# Patient Record
Sex: Female | Born: 1935 | Race: White | Hispanic: No | Smoking: Former smoker
Health system: Southern US, Community
[De-identification: ages and names within clinical notes are randomized; demographics above are authoritative.]

## PROBLEM LIST (undated history)

## (undated) DIAGNOSIS — E785 Hyperlipidemia, unspecified: Secondary | ICD-10-CM

## (undated) DIAGNOSIS — Z87440 Personal history of urinary (tract) infections: Secondary | ICD-10-CM

## (undated) DIAGNOSIS — F329 Major depressive disorder, single episode, unspecified: Secondary | ICD-10-CM

## (undated) DIAGNOSIS — R51 Headache: Secondary | ICD-10-CM

## (undated) DIAGNOSIS — N393 Stress incontinence (female) (male): Secondary | ICD-10-CM

## (undated) DIAGNOSIS — E2749 Other adrenocortical insufficiency: Secondary | ICD-10-CM

## (undated) DIAGNOSIS — K5904 Chronic idiopathic constipation: Secondary | ICD-10-CM

## (undated) DIAGNOSIS — F0390 Unspecified dementia without behavioral disturbance: Secondary | ICD-10-CM

## (undated) DIAGNOSIS — Z923 Personal history of irradiation: Secondary | ICD-10-CM

## (undated) DIAGNOSIS — C50919 Malignant neoplasm of unspecified site of unspecified female breast: Secondary | ICD-10-CM

## (undated) DIAGNOSIS — K219 Gastro-esophageal reflux disease without esophagitis: Secondary | ICD-10-CM

## (undated) DIAGNOSIS — R5383 Other fatigue: Secondary | ICD-10-CM

## (undated) DIAGNOSIS — G250 Essential tremor: Secondary | ICD-10-CM

## (undated) DIAGNOSIS — H9190 Unspecified hearing loss, unspecified ear: Secondary | ICD-10-CM

## (undated) DIAGNOSIS — F32A Depression, unspecified: Secondary | ICD-10-CM

## (undated) DIAGNOSIS — M1990 Unspecified osteoarthritis, unspecified site: Secondary | ICD-10-CM

## (undated) DIAGNOSIS — F419 Anxiety disorder, unspecified: Secondary | ICD-10-CM

## (undated) DIAGNOSIS — K5909 Other constipation: Secondary | ICD-10-CM

## (undated) DIAGNOSIS — S42009A Fracture of unspecified part of unspecified clavicle, initial encounter for closed fracture: Secondary | ICD-10-CM

## (undated) DIAGNOSIS — I10 Essential (primary) hypertension: Secondary | ICD-10-CM

## (undated) DIAGNOSIS — K559 Vascular disorder of intestine, unspecified: Secondary | ICD-10-CM

## (undated) HISTORY — PX: TONSILLECTOMY: SHX5217

## (undated) HISTORY — DX: Unspecified osteoarthritis, unspecified site: M19.90

## (undated) HISTORY — PX: ABDOMINAL HYSTERECTOMY: SHX81

## (undated) HISTORY — PX: BUNIONECTOMY: SHX129

## (undated) HISTORY — PX: TONSILLECTOMY: SUR1361

## (undated) HISTORY — DX: Malignant neoplasm of unspecified site of unspecified female breast: C50.919

## (undated) HISTORY — PX: UPPER GASTROINTESTINAL ENDOSCOPY: SHX188

## (undated) HISTORY — PX: LUMBAR LAMINECTOMY: SHX95

## (undated) HISTORY — PX: FOOT SURGERY: SHX648

## (undated) HISTORY — DX: Vascular disorder of intestine, unspecified: K55.9

## (undated) HISTORY — DX: Gastro-esophageal reflux disease without esophagitis: K21.9

## (undated) HISTORY — PX: BACK SURGERY: SHX140

## (undated) HISTORY — PX: OTHER SURGICAL HISTORY: SHX169

---

## 1985-12-01 HISTORY — PX: KNEE SURGERY: SHX244

## 2001-11-03 ENCOUNTER — Ambulatory Visit (HOSPITAL_COMMUNITY): Admission: RE | Admit: 2001-11-03 | Discharge: 2001-11-03 | Payer: Self-pay | Admitting: Internal Medicine

## 2001-11-03 ENCOUNTER — Encounter: Payer: Self-pay | Admitting: Internal Medicine

## 2001-12-01 HISTORY — PX: BREAST SURGERY: SHX581

## 2001-12-01 HISTORY — PX: OTHER SURGICAL HISTORY: SHX169

## 2001-12-16 ENCOUNTER — Ambulatory Visit (HOSPITAL_COMMUNITY): Admission: RE | Admit: 2001-12-16 | Discharge: 2001-12-16 | Payer: Self-pay | Admitting: Internal Medicine

## 2001-12-16 ENCOUNTER — Encounter: Payer: Self-pay | Admitting: Internal Medicine

## 2001-12-24 ENCOUNTER — Encounter: Admission: RE | Admit: 2001-12-24 | Discharge: 2001-12-24 | Payer: Self-pay | Admitting: Internal Medicine

## 2001-12-24 ENCOUNTER — Encounter: Payer: Self-pay | Admitting: Internal Medicine

## 2001-12-24 ENCOUNTER — Encounter (INDEPENDENT_AMBULATORY_CARE_PROVIDER_SITE_OTHER): Payer: Self-pay | Admitting: *Deleted

## 2002-01-04 ENCOUNTER — Ambulatory Visit (HOSPITAL_BASED_OUTPATIENT_CLINIC_OR_DEPARTMENT_OTHER): Admission: RE | Admit: 2002-01-04 | Discharge: 2002-01-04 | Payer: Self-pay | Admitting: Family

## 2002-01-04 ENCOUNTER — Encounter: Admission: RE | Admit: 2002-01-04 | Discharge: 2002-01-04 | Payer: Self-pay | Admitting: General Surgery

## 2002-01-04 ENCOUNTER — Encounter (INDEPENDENT_AMBULATORY_CARE_PROVIDER_SITE_OTHER): Payer: Self-pay | Admitting: *Deleted

## 2002-01-04 ENCOUNTER — Encounter: Payer: Self-pay | Admitting: General Surgery

## 2002-01-18 ENCOUNTER — Ambulatory Visit (HOSPITAL_BASED_OUTPATIENT_CLINIC_OR_DEPARTMENT_OTHER): Admission: RE | Admit: 2002-01-18 | Discharge: 2002-01-18 | Payer: Self-pay | Admitting: General Surgery

## 2002-01-18 ENCOUNTER — Encounter (INDEPENDENT_AMBULATORY_CARE_PROVIDER_SITE_OTHER): Payer: Self-pay | Admitting: *Deleted

## 2002-02-02 ENCOUNTER — Ambulatory Visit: Admission: RE | Admit: 2002-02-02 | Discharge: 2002-05-03 | Payer: Self-pay | Admitting: Radiation Oncology

## 2002-03-28 ENCOUNTER — Ambulatory Visit (HOSPITAL_COMMUNITY): Admission: RE | Admit: 2002-03-28 | Discharge: 2002-03-28 | Payer: Self-pay | Admitting: Ophthalmology

## 2002-03-29 ENCOUNTER — Ambulatory Visit (HOSPITAL_COMMUNITY): Admission: RE | Admit: 2002-03-29 | Discharge: 2002-03-29 | Payer: Self-pay | Admitting: Ophthalmology

## 2002-06-21 ENCOUNTER — Ambulatory Visit (HOSPITAL_COMMUNITY): Admission: RE | Admit: 2002-06-21 | Discharge: 2002-06-21 | Payer: Self-pay | Admitting: Ophthalmology

## 2003-01-04 ENCOUNTER — Encounter: Payer: Self-pay | Admitting: General Surgery

## 2003-01-04 ENCOUNTER — Encounter: Admission: RE | Admit: 2003-01-04 | Discharge: 2003-01-04 | Payer: Self-pay | Admitting: General Surgery

## 2003-06-02 ENCOUNTER — Ambulatory Visit (HOSPITAL_COMMUNITY): Admission: RE | Admit: 2003-06-02 | Discharge: 2003-06-02 | Payer: Self-pay | Admitting: Internal Medicine

## 2003-06-02 ENCOUNTER — Encounter: Payer: Self-pay | Admitting: Internal Medicine

## 2003-08-01 ENCOUNTER — Encounter: Payer: Self-pay | Admitting: Internal Medicine

## 2003-08-01 ENCOUNTER — Ambulatory Visit (HOSPITAL_COMMUNITY): Admission: RE | Admit: 2003-08-01 | Discharge: 2003-08-01 | Payer: Self-pay | Admitting: Internal Medicine

## 2003-12-02 HISTORY — PX: EYE SURGERY: SHX253

## 2004-01-09 ENCOUNTER — Encounter: Admission: RE | Admit: 2004-01-09 | Discharge: 2004-01-09 | Payer: Self-pay | Admitting: General Surgery

## 2004-03-22 ENCOUNTER — Ambulatory Visit (HOSPITAL_COMMUNITY): Admission: RE | Admit: 2004-03-22 | Discharge: 2004-03-22 | Payer: Self-pay | Admitting: Internal Medicine

## 2004-04-16 ENCOUNTER — Ambulatory Visit (HOSPITAL_COMMUNITY): Admission: RE | Admit: 2004-04-16 | Discharge: 2004-04-16 | Payer: Self-pay | Admitting: Internal Medicine

## 2004-09-27 ENCOUNTER — Ambulatory Visit (HOSPITAL_COMMUNITY): Admission: RE | Admit: 2004-09-27 | Discharge: 2004-09-27 | Payer: Self-pay | Admitting: Internal Medicine

## 2004-11-30 ENCOUNTER — Emergency Department (HOSPITAL_COMMUNITY): Admission: EM | Admit: 2004-11-30 | Discharge: 2004-11-30 | Payer: Self-pay | Admitting: Emergency Medicine

## 2005-01-09 ENCOUNTER — Encounter: Admission: RE | Admit: 2005-01-09 | Discharge: 2005-01-09 | Payer: Self-pay | Admitting: General Surgery

## 2005-02-07 ENCOUNTER — Ambulatory Visit (HOSPITAL_COMMUNITY): Admission: RE | Admit: 2005-02-07 | Discharge: 2005-02-07 | Payer: Self-pay | Admitting: Internal Medicine

## 2005-03-31 ENCOUNTER — Ambulatory Visit: Payer: Self-pay | Admitting: Internal Medicine

## 2005-04-22 ENCOUNTER — Ambulatory Visit (HOSPITAL_COMMUNITY): Admission: RE | Admit: 2005-04-22 | Discharge: 2005-04-22 | Payer: Self-pay | Admitting: Internal Medicine

## 2005-04-22 ENCOUNTER — Ambulatory Visit: Payer: Self-pay | Admitting: Internal Medicine

## 2005-05-21 ENCOUNTER — Ambulatory Visit: Payer: Self-pay | Admitting: Internal Medicine

## 2005-12-08 ENCOUNTER — Ambulatory Visit: Payer: Self-pay | Admitting: Internal Medicine

## 2006-01-30 ENCOUNTER — Encounter: Admission: RE | Admit: 2006-01-30 | Discharge: 2006-01-30 | Payer: Self-pay | Admitting: General Surgery

## 2006-03-24 ENCOUNTER — Encounter (INDEPENDENT_AMBULATORY_CARE_PROVIDER_SITE_OTHER): Payer: Self-pay | Admitting: *Deleted

## 2006-03-24 ENCOUNTER — Ambulatory Visit (HOSPITAL_COMMUNITY): Admission: RE | Admit: 2006-03-24 | Discharge: 2006-03-24 | Payer: Self-pay | Admitting: Internal Medicine

## 2006-03-24 ENCOUNTER — Ambulatory Visit: Payer: Self-pay | Admitting: Internal Medicine

## 2006-04-23 ENCOUNTER — Ambulatory Visit (HOSPITAL_COMMUNITY): Admission: RE | Admit: 2006-04-23 | Discharge: 2006-04-23 | Payer: Self-pay | Admitting: Family Medicine

## 2006-04-28 ENCOUNTER — Encounter (HOSPITAL_COMMUNITY): Admission: RE | Admit: 2006-04-28 | Discharge: 2006-05-28 | Payer: Self-pay | Admitting: Family Medicine

## 2006-05-06 ENCOUNTER — Ambulatory Visit: Payer: Self-pay | Admitting: Internal Medicine

## 2006-12-16 ENCOUNTER — Ambulatory Visit: Payer: Self-pay | Admitting: Internal Medicine

## 2007-02-02 ENCOUNTER — Encounter: Admission: RE | Admit: 2007-02-02 | Discharge: 2007-02-02 | Payer: Self-pay | Admitting: General Surgery

## 2008-03-03 ENCOUNTER — Encounter: Admission: RE | Admit: 2008-03-03 | Discharge: 2008-03-03 | Payer: Self-pay | Admitting: Internal Medicine

## 2008-12-15 ENCOUNTER — Ambulatory Visit (HOSPITAL_COMMUNITY): Admission: RE | Admit: 2008-12-15 | Discharge: 2008-12-15 | Payer: Self-pay | Admitting: Internal Medicine

## 2009-02-21 ENCOUNTER — Ambulatory Visit (HOSPITAL_COMMUNITY): Admission: RE | Admit: 2009-02-21 | Discharge: 2009-02-21 | Payer: Self-pay | Admitting: Internal Medicine

## 2009-05-24 ENCOUNTER — Ambulatory Visit (HOSPITAL_COMMUNITY): Admission: RE | Admit: 2009-05-24 | Discharge: 2009-05-24 | Payer: Self-pay | Admitting: Internal Medicine

## 2010-07-05 ENCOUNTER — Ambulatory Visit (HOSPITAL_COMMUNITY)
Admission: RE | Admit: 2010-07-05 | Discharge: 2010-07-05 | Payer: Self-pay | Source: Home / Self Care | Admitting: Internal Medicine

## 2010-12-06 ENCOUNTER — Ambulatory Visit (HOSPITAL_COMMUNITY)
Admission: RE | Admit: 2010-12-06 | Discharge: 2010-12-06 | Payer: Self-pay | Source: Home / Self Care | Attending: Internal Medicine | Admitting: Internal Medicine

## 2010-12-11 ENCOUNTER — Ambulatory Visit
Admission: RE | Admit: 2010-12-11 | Discharge: 2010-12-11 | Payer: Self-pay | Source: Home / Self Care | Attending: Internal Medicine | Admitting: Internal Medicine

## 2011-01-06 ENCOUNTER — Ambulatory Visit (INDEPENDENT_AMBULATORY_CARE_PROVIDER_SITE_OTHER): Payer: Medicare Other | Admitting: Internal Medicine

## 2011-01-06 DIAGNOSIS — K5900 Constipation, unspecified: Secondary | ICD-10-CM

## 2011-01-06 DIAGNOSIS — K210 Gastro-esophageal reflux disease with esophagitis: Secondary | ICD-10-CM

## 2011-01-10 NOTE — Consult Note (Signed)
  Bonnie [name] NO.:  [id number]  MEDICAL RECORD NO.:  [id number]          PATIENT TYPE:  OUT  LOCATION:  [place]                           FACILITY:  [facility]  PHYSICIAN:  Bonnie [name], M.D.    DATE OF BIRTH:  [birth year]  DATE OF VISIT:  01/06/2011                                 OFFICE NOTE   PRESENTING COMPLAINT:  Followup for nausea and GERD.  SUBJECTIVE:  Bonnie [name] is an 77 year old Caucasian female patient of Bonnie [name] who is here for scheduled visit.  She was last seen by Ms. Sasser NP on December 11, 2010.  She was switched to Dexilant.  She states she is doing much better.  She is still wondering what happened to her back in [name] when she was at her cabin and at 3 days that she has no recollection, but she states she was thought to have a CVA, however, the studies were negative.  She states she has been under lot of stress pertaining to selling of home and moving to condo, but she says since his son has been dealing with real estate agents.  When she experienced a bout of sickness in [name], she does not remember of whether or not she took her hydrocortisone.  She states she has heartburn only with certain foods that she should be eating in the first place.  She is still having some nocturnal regurgitation.  She denies chronic cough, hoarseness or sore throat.  She is having problems with constipation.  CURRENT MEDICATIONS: 1. Hair, skin, nail vitamin daily. 2. Ginkgo one daily. 3. MVI daily. 4. Hydrocortisone 10 mg q.a.m. 5. Dyazide one daily. 6. Garlic one daily. 7. Mirtazapine 15 mg p.o. nightly. 8. Simvastatin 40 mg daily. 9. Citalopram question 20 mg nightly. 10.Dexilant 60 mg p.o. q.a.m.  OBJECTIVE:  VITAL SIGNS:  Weight 133.5 pounds, she is 62 inches tall, pulse 72 per minute, blood pressure 122/74, temp is 97.4. EYES:  Conjunctivae are pink.  Sclerae are nonicteric. MOUTH:  Oropharyngeal mucosa is normal. NECK:  No neck  masses are noted. ABDOMEN:  Symmetrical, soft and nontender without organomegaly or masses. EXTREMITIES:  No peripheral edema or clubbing noted.  ASSESSMENT: 1. Chronic gastroesophageal reflux disease.  She is doing better with     Dexilant.  Since she is having nocturnal symptoms, she will be     better off trying Dexilant prior to evening meal.  She can use OTC     antacids for breakthrough symptoms. 2. The patient has history of hypoadrenalism.  Should she experience     another spell of weakness, confusion or nausea, I would ask Dr.     Sherwood [name] to assess adrenal function and determine whether dose of     hydrocortisone should be increased. 3. She will return for OV in 6 months.     Bonnie [name], M.D.     NR/MEDQ  D:  01/07/2011  T:  01/07/2011  Job:  [id number]  cc:   Bonnie Rear. Sherwood Gambler, MD Fax: [phone]  Electronically Signed by Bonnie [name] M.D. on 01/10/2011 12:12:14 PM

## 2011-04-18 NOTE — Op Note (Signed)
[place]  Patient:    Bonnie [name], Bonnie [name] Visit Number: [id number] MRN: [id number]          Service Type: DSU Location: [facility] Attending Physician:  Janalyn Rouse Dictated by:   Rose Phi. Maple [place], M.D. Proc. 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  OPERATION:  Re-excision of previous lumpectomy site.  SURGEON:  Rose Phi. Maple [place], M.D.  ANESTHESIA:  General.  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  After suitable general anesthesia was induced, the patient was placed in the supine position.  The left arm was extended on the arm board.  The breast was prepped and draped in the usual fashion.  The old radial incision was then excised outlined originally with the marking pencil and then a wide excision was carried out of the previous lumpectomy site.  We did not enter the site while we were doing this.  Hemostasis was obtained with electrocautery.  Subcuticular closure with 4-0 Monocryl and Steri-Strips was carried out.  A dressing was applied.  The patient was transferred to the recovery room in satisfactory condition having tolerated the procedure well. Dictated by:   Rose Phi. Maple [place], M.D. Attending Physician:  Janalyn Rouse DD:  01/18/02 TD:  01/18/02 Job: [id number]

## 2011-04-18 NOTE — H&P (Signed)
Bonnie [name], [name]               ACCOUNT NO.:  [id number]   MEDICAL RECORD NO.:  [id number]            PATIENT TYPE:  AMB   LOCATION:                                 FACILITY:   PHYSICIAN:  Lionel December, M.D.    DATE OF BIRTH:  05-08-1927   DATE OF ADMISSION:  DATE OF DISCHARGE:  LH                                HISTORY & PHYSICAL   PRESENTING COMPLAINT:  Too much mucus in throat and intermittent hoarseness.   HISTORY OF PRESENT ILLNESS:  Bonnie [name] is a 77 year old Caucasian female,  patient of Dr. Sharyon Medicus who is there for a scheduled visit.  She has chronic  GERD.  She states for the last several weeks she has had too much mucus in  her throat, particularly at night.  She was given antibiotics by Dr. Sherwood Gambler  that she took for 21 days.  She has noted some decrease in this mucus, but  it has not completely gone away.  She also complains of intermittent  hoarseness, need to clear her throat frequently.  She states her heartburn  is a lot better since she has been on Prilosec; however, she has some  everyday.  She has not had any difficulty in swallowing since she had an EGD  one year ago for esophageal valve which was picked up on prior barium study.  She also complains of fullness or dull pain across her upper abdomen and  feels bloated.  She denies nausea, vomiting, melena or rectal bleeding.  Her  bowels move daily.  She has a good appetite and has not lost any weight.   CURRENT MEDICATIONS:  1.  Prilosec 20 mg b.i.d.  2.  ASA 81 mg daily.  3.  MVI daily.  4.  Vitamin E daily.  5.  Darvocet-N 100 b.i.d. p.r.n. for arthralgias.  6.  Xanax dose unknown q.h.s. p.r.n.  7.  Zyrtec 10 mg daily p.r.n.  8.  Aleve usually one daily.   PAST MEDICAL HISTORY:  She has a history of ischemic colitis dating back to  1989.  She had another episode in 1990 and last episode in 2/95 at which  time she had a colonoscopy.  She has not had screening colonoscopy since  recent colonoscopy.  This was  recommended to the patient when she was seen  one year ago, but has not had it yet.  History of left breast carcinoma.  She has had a lumpectomy.  She has osteoarthrosis.  Other surgeries include  tonsillectomy, lumbar laminectomy for disk prolapse.  She has had a  hysterectomy and surgery to remove stone from a right ureter and/or kidney.  She has also had bilateral bunionectomy and rotator cuff repair.   ALLERGIES:  Macrodantin.   FAMILY HISTORY:  Both parents are deceased.  They lived to be in their 90s.  She has one sister, four brothers, all of whom are deceased.  One died of MI  at age 75, another one of auto accident at age 5 and two died of alcohol-  related problems, one in their 87s, another one in  early 37s.   SOCIAL HISTORY:  She is retired from [employer].  She has been smoking up to two  packs of cigarettes per day for years, but in the last five years she has  cut down to p.r.n.  She drinks alcohol occasionally.   OBJECTIVE:  VITAL SIGNS:  Weight 138-1/2 pounds.  She is 61.5 inches tall.  Pulse 64 per minute, blood pressure 118/68.  HEENT:  Conjunctivae is pink.  Oropharyngeal mucosa is normal.  She has  upper dental plate and few remaining teeth in lower jaw.  No neck masses or  thyromegaly are noted.  LUNGS:  Clear to auscultation.  CARDIAC:  Regular rhythm.  Normal S1 __________ S3, no murmur or gallop  noted.  LUNGS:  Clear to auscultation.  ABDOMEN:  Symmetrical bowel sounds are normal.  Vague tenderness noted in  midepigastrium.  No organomegaly or masses.  RECTAL:  Deferred.  EXTREMITIES:  No peripheral edema or clubbing noted.   ASSESSMENT:  Bonnie [name] has chronic gastroesophageal reflux disease.  She now  presents with pharyngeal symptoms.  She has need to clear her throat and  also feels as if she has mucus all the time.  Symptoms are worse at night.  She is also having intermittent hoarseness despite being on Prilosec b.i.d.  Recent course of antibiotic provided  little relief.  She is also on Zyrtec.  Therefore, her mucus may not be from postnasal drip.   Remote history of ischemic colitis.  She is average risk for colon  carcinoma.  Last colonoscopy was in February 1995.   RECOMMENDATIONS:  1.  She will continue antireflux measures and Prilosec as before.  2.  EGD with __________ for pH.  This will be performed in about two weeks.      The procedure was reviewed with the patient and she is agreeable.   The patient would like to have colonoscopy, but at a later date.      NR/MEDQ  D:  03/31/2005  T:  03/31/2005  Job:  [id number]   cc:   Madelin Rear. Sherwood Gambler, MD  [address]  [place]  [place] [address]  Fax: [phone]

## 2011-04-18 NOTE — Op Note (Signed)
Bonnie [name], Bonnie [name]               ACCOUNT NO.:  [id number]   MEDICAL RECORD NO.:  [id number]          PATIENT TYPE:  AMB   LOCATION:  DAY                           FACILITY:  [facility]   PHYSICIAN:  Lionel December, M.D.    DATE OF BIRTH:  18-Dec-1926   DATE OF PROCEDURE:  04/22/2005  DATE OF DISCHARGE:                                 OPERATIVE REPORT   PROCEDURE:  Esophagogastroduodenoscopy with placement of Bravo device for  pH.   Esophagogastroduodenoscopy with pH study with the Bravo device.   INDICATIONS:  Bonnie [name] is 77 year old Caucasian female who has chronic GERD.  She has been maintained on double dose PPI and now presents with hoarseness,  too much mucus in her throat, needs to clear her throat. She has been  treated with antibiotics but without symptomatic improvement. She is  therefore undergoing this evaluation. Her heartburn, however, has been well  controlled with therapy. She is presently on Prilosec 20 mg b.i.d. Procedure  is reviewed with the patient and informed consent was obtained.   PREMEDICATION:  Cetacaine spray pharyngeal topical anesthesia, Demerol 50 mg  IV, Versed 4 mg IV in divided dose.   FINDINGS:  Procedure performed in endoscopy suite. The patient's vital signs  and O2 saturations were monitored during procedure and remained stable. The  patient was placed in left lateral position and Olympus video scope was  passed in oropharynx without any difficulty into esophagus.   Esophagus:  Mucosa of the esophagus normal. GE junction was at 37 to 37.5 cm  and hiatus was at 39. She had a small sliding hiatal hernia. No ring or  stricture was noted.   Stomach:  It was empty and distended well insufflation. Folds of proximal  stomach were normal. Examination of mucosa at body, antrum, pyloric channel  as well as angularis, fundus and cardia was normal.   Duodenum:  Bulbar mucosa was normal. Scope was advanced to the second part  of duodenum. The mucosa and folds  were normal. Endoscope was withdrawn.   A Bravo device was loaded onto the delivery system and after it was  calibrated, it was blindly passed into esophagus without any difficulty to  31.5 cm. It was connected to suction device. It was maintained for 30  seconds after which the device was for 30 seconds followed by pushing the  plunger to secure to the esophageal mucosa. The device was removed after  turning it clockwise. Endoscope was passed again and the Bravo device was in  good position. Pictures taken for the record and the endoscope was  withdrawn.   1. Normal examination of the esophagus.  2. Small sliding hiatal hernia.  3. No evidence of peptic ulcer disease.  4. The Bravo device placed in distal esophagus as above for pH monitoring      for the next 48 hours.     RECOMMENDATIONS:  Stent instructions given. The patient will continue her  Prilosec as before. She will return in 48 hours.      NR/MEDQ  D:  04/22/2005  T:  04/22/2005  Job:  [id number]  cc:   Madelin Rear. Sherwood Gambler, MD  [address]  [place]  [place] [address]  Fax: [phone]

## 2011-04-18 NOTE — Op Note (Signed)
Bonnie [name], Bonnie [name]               ACCOUNT NO.:  [id number]   MEDICAL RECORD NO.:  [id number]          PATIENT TYPE:  AMB   LOCATION:  DAY                           FACILITY:  [facility]   PHYSICIAN:  Lionel December, M.D.    DATE OF BIRTH:  02-17-1927   DATE OF PROCEDURE:  03/24/2006  DATE OF DISCHARGE:                                 OPERATIVE REPORT   PROCEDURE:  Colonoscopy.   INDICATIONS:  [name] is a 77 year old Caucasian female, who is here for a  screening colonoscopy.  Her last exam was ten years ago.  She is complaining  of vague midline pain, starting inferiorly and radiating superiorly, but she  does not have any melena, rectal bleeding, or change in her bowel habits.  The procedure was reviewed with the patient and informed consent was  obtained.   MEDICATIONS FOR CONSCIOUS SEDATION:  Demerol 50 mg IV and Versed 5 mg IV.   FINDINGS:  The procedure was performed in the endoscopy suite.  The  patient's vital signs and O2 sats were monitored during the procedure and  remained stable.  The patient was placed in the left lateral decubitus  position and rectal exam was performed.  No abnormality was noted on  external or digital exam.  The Olympus video colonoscope was placed through  the rectum and advanced into the sigmoid colon and beyond.  The preparation  was excellent.  A few tiny diverticula were noted scattered at the sigmoid  and the descending colon.  The scope was passed through to the cecum, which  was identified by the appendiceal orificeand ileocecal valve.  Pictures were  taken for the record.  As the scope was withdrawn, the colonic mucosa was  carefully examined.  There was only a single polyp across the ileocecal  valve, which was ablated by a cold biopsy.  The rest of the mucosa was  normal.  The scope was retroflexed to examine the anorectal junction which  was unremarkable.  The mucosa in the rest of the colon and rectum was  normal.  The scope was retroflexed  and examined in the rectumwhich was  unremarkable.  The endoscope was straightened and withdrawn.  The patient  tolerated the procedure well.   FINAL DIAGNOSES:  1.  A small cecal polyp, which was ablated by a cold biopsy.  2.  A few tiny diverticula at the sigmoid and descending colon.   RECOMMENDATIONS:  High fiber diet plus fiber supplement, 3-4 grams q. day,  Levsin/SL t.i.d. p.r.n.   I will be contacting the patient with the biopsy results and further  recommendations.      Lionel December, M.D.  Electronically Signed    NR/MEDQ  D:  03/24/2006  T:  03/24/2006  Job:  [id number]

## 2011-04-18 NOTE — Op Note (Signed)
NAME:  Bonnie [name], Bonnie [name]                         ACCOUNT NO.:  [id number]   MEDICAL RECORD NO.:  [id number]                   PATIENT TYPE:  AMB   LOCATION:  DAY                                  FACILITY:  [facility]   PHYSICIAN:  Lionel December, M.D.                 DATE OF BIRTH:  04-13-1927   DATE OF PROCEDURE:  04/16/2004  DATE OF DISCHARGE:                                 OPERATIVE REPORT   PROCEDURE:  Esophagogastroduodenoscopy with esophageal dilatation.   INDICATIONS FOR PROCEDURE:  Ms. Vonderhaar is a 77 year old Caucasian female who  presented with a few week history of dysphagia primarily to solids, and she  points to her suprasternal area as the site of bolus obstruction.  She had a  barium study which revealed a cervical esophageal web and a small sliding  hiatal hernia.  The barium pill temporarily got held up at the level of this  ring.  She is undergoing diagnostic and therapeutic EGD.  The patient tells  me that after she passed barium with her stools, she had a few black bowel  movements which cleared spontaneously.  She is on low-dose ASA.   The procedure risks were reviewed with the patient, and informed consent for  the procedure was obtained.   PREOPERATIVE MEDICATIONS:  Demerol 30 mg IV, Versed 3.5 mg IV.   FINDINGS:  The procedure was performed in the endoscopy suite.  The  patient's vital signs and O2 saturations were monitored during the procedure  and remained stable.  The patient was placed in the left lateral recumbent  position, and the Olympus videoscope was passed via the oropharynx without  any difficulty into the esophagus.   Esophagus:  There was a ring or a web in the cervical esophagus.  The rest  of the mucosa was normal.  There was no obvious ring at the GE junction.  There was a small sliding hiatal hernia.   Stomach:  It was empty and distended very well with insufflation.  The folds  of the proximal stomach were normal.  Examination of  the mucosa  revealed  multiple antral erosions.  No ulcer was noted.  The pyloric channel was  patent.  The angularis, fundus, and cardia were examined by retroflexing the  scope and were normal.   Duodenum:  Examination of the bulb revealed patchy erythema and erosions,  but no ulcer crater was found.  The mucosa and folds of the second part of  the duodenum were normal.  The endoscope was withdrawn.  The esophagus was  dilated by passing a 54 [place] Maloney dilator to full insertion.  As the  dilator was withdrawn, the endoscope was passed again.  There were three  tears, one in the cervical esophagus, one at 30 cm, and a small tear at the  GE junction.  There was a blind, inconspicuous ring.  There was some oozing.  I washed  the proximal esophagus for about five minutes.  This stopped  spontaneously.  Multiple pictures were taken for the record.  The endoscope  was withdrawn.  The patient tolerated the procedure well.   FINAL DIAGNOSES:  1. Esophageal web with distal esophageal ring was apparent after esophageal     dilation with 54 [place] Maloney dilator.  2. Small sliding hiatal hernia.  3. Erosive antral gastritis and bulbar duodenitis which may account for     recent melena.   RECOMMENDATIONS:  1. CBC and H pylori will be checked today.  2. Omeprazole 20 mg p.o. q.a.m.  3. I will also ask the patient to consider colonoscopy primarily for     screening purposes at a later date.      ___________________________________________                                            Lionel December, M.D.   NR/MEDQ  D:  04/16/2004  T:  04/16/2004  Job:  [id number]   cc:   Madelin Rear. Sherwood Gambler, M.D.  [address]  [place]  [place] [address]  Fax: [phone]

## 2011-04-18 NOTE — Op Note (Signed)
[place]  Patient:    Bonnie [name], Bonnie [name] Visit Number: [id number] MRN: [id number]          Service Type: DSU Location: [facility] Attending Physician:  Janalyn Rouse Dictated by:   Rose Phi. Maple [place], M.D. Proc. 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  OPERATION PERFORMED:  Excisional biopsy of left breast mass.  SURGEON:  Rose Phi. Maple [place], M.D.  ANESTHESIA:  MAC.  INDICATIONS FOR PROCEDURE:  This patient has an area of microcalcifications in the 3 oclock position of the left breast, which on core biopsy, had shown atypical ductal hyperplasia.  We are doing excisional biopsy now to rule out intraductal carcinoma.  DESCRIPTION OF PROCEDURE:  Prior to coming to the operating room, the patient had a wire placed into where the two clips had been left.  She was then placed on the operating table with the left arm extended on the arm board.  The breast was prepped and draped in the usual fashion.  A curvilinear incision was then outlined centered on the wire and the area thoroughly infiltrated with 1% Xylocaine with Adrenalin.  An incision was made and I excised the wire with surrounding tissue.  Specimen mammography confirmed the removal of the two clips and the lesion.  Just medial to this at the 9 oclock position and almost subareolar was a kind of a hard nodule which I excised as a separate specimen.  With good hemostasis, subcuticular closure of 4-0 Monocryl and Steri-Strips carried out.  Dressing applied.  The patient was transferred to the recovery room in satisfactory condition having tolerated the procedure well. Dictated by:   Rose Phi. Maple [place], M.D. Attending Physician:  Janalyn Rouse DD:  01/04/02 TD:  01/04/02 Job:  [id number]

## 2011-05-07 ENCOUNTER — Emergency Department (HOSPITAL_COMMUNITY)
Admission: EM | Admit: 2011-05-07 | Discharge: 2011-05-07 | Disposition: A | Discharge status: Home / Self Care | Payer: Medicare Other | Attending: Emergency Medicine | Admitting: Emergency Medicine

## 2011-05-07 DIAGNOSIS — R197 Diarrhea, unspecified: Secondary | ICD-10-CM | POA: Insufficient documentation

## 2011-05-07 DIAGNOSIS — Z853 Personal history of malignant neoplasm of breast: Secondary | ICD-10-CM | POA: Insufficient documentation

## 2011-05-07 LAB — CBC
HCT: 35.8 % — ABNORMAL LOW (ref 36.0–46.0)
Hemoglobin: 11.7 g/dL — ABNORMAL LOW (ref 12.0–15.0)
MCHC: 32.7 g/dL (ref 30.0–36.0)
RBC: 4.06 MIL/uL (ref 3.87–5.11)
RDW: 13 % (ref 11.5–15.5)

## 2011-05-07 LAB — DIFFERENTIAL
Eosinophils Relative: 2 % (ref 0–5)
Lymphocytes Relative: 27 % (ref 12–46)
Lymphs Abs: 1.3 10*3/uL (ref 0.7–4.0)
Neutro Abs: 2.9 10*3/uL (ref 1.7–7.7)
Neutrophils Relative %: 61 % (ref 43–77)

## 2011-05-07 LAB — COMPREHENSIVE METABOLIC PANEL
Alkaline Phosphatase: 57 U/L (ref 39–117)
Chloride: 102 mEq/L (ref 96–112)
Glucose, Bld: 97 mg/dL (ref 70–99)
Potassium: 3.3 mEq/L — ABNORMAL LOW (ref 3.5–5.1)
Total Bilirubin: 0.3 mg/dL (ref 0.3–1.2)
Total Protein: 6.3 g/dL (ref 6.0–8.3)

## 2011-05-07 LAB — URINALYSIS, ROUTINE W REFLEX MICROSCOPIC
Bilirubin Urine: NEGATIVE
Glucose, UA: NEGATIVE mg/dL
pH: 5.5 (ref 5.0–8.0)

## 2011-05-07 LAB — URINE MICROSCOPIC-ADD ON

## 2011-05-07 LAB — LIPASE, BLOOD: Lipase: 22 U/L (ref 11–59)

## 2011-05-08 LAB — URINE CULTURE
Colony Count: 1000
Culture  Setup Time: 201206061620

## 2011-05-08 LAB — OVA AND PARASITE EXAMINATION: Ova and parasites: NONE SEEN

## 2011-05-11 LAB — STOOL CULTURE

## 2011-05-20 ENCOUNTER — Ambulatory Visit (INDEPENDENT_AMBULATORY_CARE_PROVIDER_SITE_OTHER): Payer: Medicare Other | Admitting: Internal Medicine

## 2011-05-20 DIAGNOSIS — R197 Diarrhea, unspecified: Secondary | ICD-10-CM

## 2011-05-30 ENCOUNTER — Other Ambulatory Visit (INDEPENDENT_AMBULATORY_CARE_PROVIDER_SITE_OTHER): Payer: Self-pay | Admitting: Internal Medicine

## 2011-05-30 ENCOUNTER — Ambulatory Visit (HOSPITAL_COMMUNITY)
Admission: RE | Admit: 2011-05-30 | Discharge: 2011-05-30 | Disposition: A | Discharge status: Home / Self Care | Payer: Medicare Other | Source: Ambulatory Visit | Attending: Internal Medicine | Admitting: Internal Medicine

## 2011-05-30 ENCOUNTER — Encounter (HOSPITAL_BASED_OUTPATIENT_CLINIC_OR_DEPARTMENT_OTHER): Payer: Medicare Other | Admitting: Internal Medicine

## 2011-05-30 DIAGNOSIS — K5730 Diverticulosis of large intestine without perforation or abscess without bleeding: Secondary | ICD-10-CM | POA: Insufficient documentation

## 2011-05-30 DIAGNOSIS — R197 Diarrhea, unspecified: Secondary | ICD-10-CM

## 2011-05-30 DIAGNOSIS — K5289 Other specified noninfective gastroenteritis and colitis: Secondary | ICD-10-CM | POA: Insufficient documentation

## 2011-06-01 NOTE — Op Note (Signed)
  NAMEMONIQUE, HEFTY NO.:  [id number]  MEDICAL RECORD NO.:  [id number]  LOCATION:  [facility]                          FACILITY:  [facility]  PHYSICIAN:  Lionel December, M.D.    DATE OF BIRTH:  May 27, 1927  DATE OF PROCEDURE:  05/30/2011 DATE OF DISCHARGE:                              OPERATIVE REPORT   PROCEDURE:  Colonoscopy.  INDICATION:  Leather is an 77 year old Caucasian female who has remote history of ischemic colitis, who now presents with diarrhea of 8 weeks' duration.  She states some days she has 8-10 bowel movements and then she has had excellent.  Her stool studies have been negative and she has not unrelieved with Lomotil.  Her last colonoscopy was in April 2007 with removal of a small tubular adenoma.  Procedure risks were reviewed with the patient.  Informed consent was obtained.  MEDICATIONS FOR CONSCIOUS SEDATION:  Demerol 50 mg IV and Versed 5 mg IV in divided dose.  FINDINGS:  Procedure performed in endoscopy suite.  The patient's vital signs and O2 sat were monitored during the procedure and remained stable.  The patient was placed in left lateral recumbent position. Rectal examination was performed.  No abnormality noted on external or digital exam.  Pentax videoscope was placed through rectum and advanced under vision into distal sigmoid colon which was tortuous with few diverticula in this area.  The patient was turned in supine position, the scope was passed beyond this area.  Preparation was satisfactory. Scope was then easily passed into cecum.  Cecum was identified by appendiceal stump and ileocecal valve.  Pictures were taken for the record.  As the scope was withdrawn, colonic mucosa was carefully examined.  There were few areas at the ascending colon with spec of fresh blood.  This was possibly scope related areas of washer and underlying mucosa was normal.  Mucosa and rest of the colon was also normal.  Random biopsies taken from mucosa  of sigmoid colon looking for microscopic and/or collagenous colitis.  There was some focal scarring at rectosigmoid junction.  Rectal mucosa was normal.  Scope was retroflexed to examine anorectal junction which was unremarkable. Endoscope was withdrawn.  Withdrawal time was 13 minutes.  The patient tolerated the procedure well.  FINAL DIAGNOSES: 1. Examination performed to cecum. 2. No evidence of endoscopic colitis. 3. Few diverticula at distal sigmoid colon. 4. Random biopsies were taken from mucosa of sigmoid colon looking for     microscopic/collagenous colitis.  RECOMMENDATIONS:  While awaiting for results of biopsies I would like for the patient to take loperamide 2 mg every morning after first bowel movement and Benefiber 4 g daily.  I will be contacting with results of biopsy and further recommendations.          ______________________________ Lionel December, M.D.     NR/MEDQ  D:  05/30/2011  T:  05/30/2011  Job:  [id number]  cc:   Madelin Rear. Sherwood Gambler, MD Fax: [phone]  Electronically Signed by Lionel December M.D. on 06/01/2011 12:48:43 PM

## 2011-06-01 NOTE — Consult Note (Signed)
Bonnie [name], Bonnie [name] NO.:  [id number]  MEDICAL RECORD NO.:  [id number]  LOCATION:  [facility]                          FACILITY:  [facility]  PHYSICIAN:  Lionel December, M.D.    DATE OF BIRTH:  04-17-1927  DATE OF CONSULTATION:  05/20/2011 DATE OF DISCHARGE:                                  CONSULTATION   This is an office visit for diarrhea.  HISTORY OF PRESENT ILLNESS:  Bonnie [name] is an 77 year old female presenting today with complaints that she has had diarrhea for 7 weeks.  She was having 3-4 stools a day which were loose.  She did see Dr. Sherwood Gambler the end of May and stool studies were ordered.  Her C. diff, stool culture and sensitivity and ova and para was negative.  She was given a prescription for Lomotil which did help.  She says she was incontinent of feces at times.  She was seen in the emergency department a week ago for diarrhea and was given IV fluids.  Today, she states she feels better.  She had a halfway formed stool yesterday.  She has not had a stool today.  She says she has noticed her stools being black at times, but she has been recently taking Pepto-Bismol.  She says she has lost about 20 pounds over the past 7 weeks because of her loose stools.  She says her appetite has not been good and she has been nauseated.  Her last colonoscopy was in April 2007.  The colonoscopy revealed a few tiny diverticula at the sigmoid and descending colon and she had a small cecal polyp which was biopsied.  The biopsy revealed a tubular adenoma. There was no high-grade dysplasia or malignancy identified.  She is actually due for a colonoscopy at this time.  From her record, she tells me her weight is usually 140, her weight today is 127.  She is allergic to [facility].  HOME MEDICATIONS: 1. Celexa 20 mg one a day. 2. Hydrocortisone one half daily. 3. Triamterene/hydrochlorothiazide 75/50 p.r.n. 4. Mirtazapine 15 mg at night. 5. Simvastatin 40 mg a day. 6. Dexilant  60 mg one a day. 7. Align as needed. 8. Xanax p.r.n. 9. Lomotil p.r.n. 10.Tramadol 50 p.r.n.  PAST MEDICAL HISTORY:  She has a history of ischemic colitis dating back to 1989.  She had an episode in 1990 and her last episode was in February 1995, at which time she had a colonoscopy.  She has a history of a left breast carcinoma and she had a lumpectomy.  She has osteoarthritis in the past.  She has had a tonsillectomy, lumbar laminectomy for disk prolapse.  She has had a hysterectomy and surgery to remove a stone from her right ureter and her kidney.  If she has had a bilateral bunionectomy and rotor cuff repair.  FAMILY HISTORY:  Both parents are deceased.  They lived to be in their 90s.  She had 1 sister and 4 brothers all are deceased.  One died of an MI at age 28 and another one of an auto accident at age 8 and two died of alcohol-related problems, one in their 68s and other in their early 88s.  She is retired from [employer].  She has not smoked in a very long time.  She occasionally drinks alcohol.  Her past medical history, surgeries and family and social history were verified by the patient.  OBJECTIVE:  VITAL SIGNS:  Her weight is 127, her blood pressure is 90/60, her temperature is 97.2 and pulse 76. MOUTH:  Her oral mucosa is moist.  Natural teeth. EYES:  Conjunctivae are pink.  Sclerae are anicteric. NECK:  Thyroid is normal.  There is no cervical lymphadenopathy. LUNGS:  Clear. HEART:  Regular rate and rhythm. ABDOMEN:  Soft and her bowel sounds are positive.  No masses.  Her stool was brown guaiac negative.  ASSESSMENT:  Bonnie [name] is an 77 year old female presenting today with diarrhea.  Her stool was formed yesterday.  Hopefully, her diarrhea is resolving.  She is due for a surveillance colonoscopy due to her hx of having tubular adenoma.  RECOMMENDATIONS:  She will try Imodium twice a day, once in the morning and once at night and we will schedule  her for a diagnostic/surveillance colonoscopy.  The risk and benefits were reviewed with her and she is agreeable.  Thank you for allowing [place] to participate in her care.    ______________________________ Dorene Ar, NP   ______________________________ Lionel December, M.D.    TS/MEDQ  D:  05/20/2011  T:  05/21/2011  Job:  [id number]  cc:   Madelin Rear. Sherwood Gambler, MD Fax: [phone]  Electronically Signed by Dorene Ar PA on 05/22/2011 04:52:54 PM Electronically Signed by Lionel December M.D. on 06/01/2011 12:48:28 PM

## 2011-09-22 ENCOUNTER — Ambulatory Visit (INDEPENDENT_AMBULATORY_CARE_PROVIDER_SITE_OTHER): Payer: Medicare Other | Admitting: Internal Medicine

## 2011-09-22 ENCOUNTER — Encounter (INDEPENDENT_AMBULATORY_CARE_PROVIDER_SITE_OTHER): Payer: Self-pay | Admitting: Internal Medicine

## 2011-09-22 VITALS — BP 118/82 | HR 80 | Temp 98.6°F | Resp 12 | Ht 62.0 in | Wt 142.0 lb

## 2011-09-22 DIAGNOSIS — K52832 Lymphocytic colitis: Secondary | ICD-10-CM

## 2011-09-22 DIAGNOSIS — K5289 Other specified noninfective gastroenteritis and colitis: Secondary | ICD-10-CM

## 2011-09-22 NOTE — Patient Instructions (Signed)
In January, 2013 can decrease Dexilant ( dexlansoprazole) to every other day.

## 2011-09-23 NOTE — Progress Notes (Signed)
Presenting complaint; followup for diarrhea and lymphocytic colitis. Subjective;  Patient is 77 year old Caucasian female who presented with several week history of diarrhea and negative stool studies. She underwent colonoscopy on 05/30/2011 revealing a few diverticula at distal sigmoid colon but normal colonic mucosa. Biopsy from sigmoid colon mucosa revealed lymphocytic colitis. Patient was advised to take loperamide 2 mg every day and Benefiber 4 g daily.  He stopped loperamide after few days since she began to have normal bowel movements. Presently she is having 2 formed stools a day and every now and then she may go hold it without a bowel movement. He denies abdominal pain melena or rectal bleeding. On few occasions she thought she was constipated and used suppository. She states her heartburn is well controlled with therapy. Current medications Current Outpatient Prescriptions  Medication Sig Dispense Refill  . ALPRAZolam (XANAX) 0.25 MG tablet Take 0.25 mg by mouth as needed.        . citalopram (CELEXA) 20 MG tablet Take 20 mg by mouth as needed.        Marland Kitchen dexlansoprazole (DEXILANT) 60 MG capsule Take 60 mg by mouth daily.        . hydrocortisone (CORTEF) 20 MG tablet Take 10 mg by mouth daily. 1/2 tablet daily       . mirtazapine (REMERON) 15 MG tablet Take 15 mg by mouth at bedtime.        . Probiotic Product (ALIGN PO) Take by mouth. As Needed        . simvastatin (ZOCOR) 40 MG tablet Take 40 mg by mouth at bedtime.        . traMADol (ULTRAM) 50 MG tablet Take 50 mg by mouth as needed.         Objective BP 118/82  Pulse 80  Temp(Src) 98.6 F (37 C) (Oral)  Resp 12  Ht 5\' 2"  (1.575 m)  Wt 142 lb (64.411 kg)  BMI 25.97 kg/m2 Conjunctiva is pink. Sclera is nonicteric. Oropharyngeal mucosa is normal. No neck masses noted. Abdomen is symmetrical. Bowel sounds are normal. No organomegaly masses or tenderness noted. No LE edema. Assessment #1.Diarrhea has resolved. As for as her  bowel habits are concerned she is back to her baseline. Biopsy dated showed lymphocytic colitis; since she is not having diarrhea there is no indication for treatment. #2.Chronic GERD. She is doing well with therapy Plan Continue fiber supplement Call if diarrhea or relapses Starting in January 2013, try Dexilant every other day. Office visit on as-needed basis

## 2011-10-01 ENCOUNTER — Ambulatory Visit (HOSPITAL_COMMUNITY)
Admission: RE | Admit: 2011-10-01 | Discharge: 2011-10-01 | Disposition: A | Discharge status: Home / Self Care | Payer: Medicare Other | Source: Ambulatory Visit | Attending: Internal Medicine | Admitting: Internal Medicine

## 2011-10-01 ENCOUNTER — Other Ambulatory Visit (HOSPITAL_COMMUNITY): Payer: Self-pay | Admitting: Internal Medicine

## 2011-10-01 DIAGNOSIS — M79601 Pain in right arm: Secondary | ICD-10-CM

## 2011-10-01 DIAGNOSIS — M25519 Pain in unspecified shoulder: Secondary | ICD-10-CM | POA: Insufficient documentation

## 2011-10-01 DIAGNOSIS — M25512 Pain in left shoulder: Secondary | ICD-10-CM

## 2011-10-01 DIAGNOSIS — M67919 Unspecified disorder of synovium and tendon, unspecified shoulder: Secondary | ICD-10-CM | POA: Insufficient documentation

## 2011-10-01 DIAGNOSIS — M25529 Pain in unspecified elbow: Secondary | ICD-10-CM | POA: Insufficient documentation

## 2011-10-01 DIAGNOSIS — R918 Other nonspecific abnormal finding of lung field: Secondary | ICD-10-CM | POA: Insufficient documentation

## 2011-10-01 DIAGNOSIS — M719 Bursopathy, unspecified: Secondary | ICD-10-CM | POA: Insufficient documentation

## 2011-10-07 ENCOUNTER — Other Ambulatory Visit (HOSPITAL_COMMUNITY): Payer: Self-pay | Admitting: Internal Medicine

## 2011-10-07 ENCOUNTER — Ambulatory Visit (HOSPITAL_COMMUNITY)
Admission: RE | Admit: 2011-10-07 | Discharge: 2011-10-07 | Disposition: A | Discharge status: Home / Self Care | Payer: Medicare Other | Source: Ambulatory Visit | Attending: Internal Medicine | Admitting: Internal Medicine

## 2011-10-07 DIAGNOSIS — M25519 Pain in unspecified shoulder: Secondary | ICD-10-CM

## 2011-10-07 DIAGNOSIS — R937 Abnormal findings on diagnostic imaging of other parts of musculoskeletal system: Secondary | ICD-10-CM | POA: Insufficient documentation

## 2011-12-02 HISTORY — PX: BREAST SURGERY: SHX581

## 2011-12-23 ENCOUNTER — Other Ambulatory Visit (HOSPITAL_COMMUNITY): Payer: Self-pay | Admitting: Internal Medicine

## 2011-12-23 DIAGNOSIS — Z139 Encounter for screening, unspecified: Secondary | ICD-10-CM

## 2012-01-16 ENCOUNTER — Ambulatory Visit (HOSPITAL_COMMUNITY)
Admission: RE | Admit: 2012-01-16 | Discharge: 2012-01-16 | Disposition: A | Discharge status: Home / Self Care | Payer: Medicare Other | Source: Ambulatory Visit | Attending: Internal Medicine | Admitting: Internal Medicine

## 2012-01-16 DIAGNOSIS — Z139 Encounter for screening, unspecified: Secondary | ICD-10-CM

## 2012-01-16 DIAGNOSIS — Z1231 Encounter for screening mammogram for malignant neoplasm of breast: Secondary | ICD-10-CM | POA: Insufficient documentation

## 2012-01-21 ENCOUNTER — Other Ambulatory Visit: Payer: Self-pay | Admitting: Internal Medicine

## 2012-01-21 DIAGNOSIS — R928 Other abnormal and inconclusive findings on diagnostic imaging of breast: Secondary | ICD-10-CM

## 2012-01-28 ENCOUNTER — Other Ambulatory Visit (HOSPITAL_COMMUNITY): Payer: Self-pay | Admitting: Internal Medicine

## 2012-01-28 DIAGNOSIS — H5319 Other subjective visual disturbances: Secondary | ICD-10-CM

## 2012-01-28 DIAGNOSIS — R928 Other abnormal and inconclusive findings on diagnostic imaging of breast: Secondary | ICD-10-CM

## 2012-02-11 ENCOUNTER — Other Ambulatory Visit: Payer: Self-pay | Admitting: Internal Medicine

## 2012-02-11 ENCOUNTER — Ambulatory Visit (HOSPITAL_COMMUNITY)
Admission: RE | Admit: 2012-02-11 | Discharge: 2012-02-11 | Disposition: A | Discharge status: Home / Self Care | Payer: Medicare Other | Source: Ambulatory Visit | Attending: Internal Medicine | Admitting: Internal Medicine

## 2012-02-11 DIAGNOSIS — R928 Other abnormal and inconclusive findings on diagnostic imaging of breast: Secondary | ICD-10-CM

## 2012-02-11 DIAGNOSIS — N63 Unspecified lump in unspecified breast: Secondary | ICD-10-CM | POA: Insufficient documentation

## 2012-02-11 DIAGNOSIS — R921 Mammographic calcification found on diagnostic imaging of breast: Secondary | ICD-10-CM

## 2012-02-11 DIAGNOSIS — Z853 Personal history of malignant neoplasm of breast: Secondary | ICD-10-CM | POA: Insufficient documentation

## 2012-02-13 ENCOUNTER — Ambulatory Visit
Admission: RE | Admit: 2012-02-13 | Discharge: 2012-02-13 | Disposition: A | Discharge status: Home / Self Care | Payer: Medicare Other | Source: Ambulatory Visit | Attending: Internal Medicine | Admitting: Internal Medicine

## 2012-02-13 ENCOUNTER — Other Ambulatory Visit: Payer: Self-pay | Admitting: Internal Medicine

## 2012-02-13 DIAGNOSIS — R921 Mammographic calcification found on diagnostic imaging of breast: Secondary | ICD-10-CM

## 2012-02-17 ENCOUNTER — Other Ambulatory Visit: Payer: Self-pay | Admitting: Internal Medicine

## 2012-02-17 ENCOUNTER — Ambulatory Visit
Admission: RE | Admit: 2012-02-17 | Discharge: 2012-02-17 | Disposition: A | Discharge status: Home / Self Care | Payer: Medicare Other | Source: Ambulatory Visit | Attending: Internal Medicine | Admitting: Internal Medicine

## 2012-02-17 DIAGNOSIS — R921 Mammographic calcification found on diagnostic imaging of breast: Secondary | ICD-10-CM

## 2012-02-17 DIAGNOSIS — C50911 Malignant neoplasm of unspecified site of right female breast: Secondary | ICD-10-CM

## 2012-02-19 ENCOUNTER — Telehealth: Payer: Self-pay | Admitting: *Deleted

## 2012-02-19 ENCOUNTER — Other Ambulatory Visit: Payer: Self-pay | Admitting: *Deleted

## 2012-02-19 DIAGNOSIS — D0510 Intraductal carcinoma in situ of unspecified breast: Secondary | ICD-10-CM

## 2012-02-19 NOTE — Telephone Encounter (Signed)
Confirmed [facility] for 02/25/12 at 1200 .  Instructions and contact information given.

## 2012-02-20 ENCOUNTER — Ambulatory Visit
Admission: RE | Admit: 2012-02-20 | Discharge: 2012-02-20 | Disposition: A | Discharge status: Home / Self Care | Payer: Medicare Other | Source: Ambulatory Visit | Attending: Internal Medicine | Admitting: Internal Medicine

## 2012-02-20 DIAGNOSIS — C50911 Malignant neoplasm of unspecified site of right female breast: Secondary | ICD-10-CM

## 2012-02-20 MED ORDER — GADOBENATE DIMEGLUMINE 529 MG/ML IV SOLN
6.0000 mL | Freq: Once | INTRAVENOUS | Status: AC | PRN
  Administered 2012-02-20: 6 mL via INTRAVENOUS

## 2012-02-25 ENCOUNTER — Ambulatory Visit (HOSPITAL_BASED_OUTPATIENT_CLINIC_OR_DEPARTMENT_OTHER): Payer: Medicare Other | Admitting: Surgery

## 2012-02-25 ENCOUNTER — Encounter: Payer: Self-pay | Admitting: Oncology

## 2012-02-25 ENCOUNTER — Encounter: Payer: Self-pay | Admitting: Radiation Oncology

## 2012-02-25 ENCOUNTER — Ambulatory Visit (HOSPITAL_BASED_OUTPATIENT_CLINIC_OR_DEPARTMENT_OTHER): Payer: Medicare Other | Admitting: Oncology

## 2012-02-25 ENCOUNTER — Ambulatory Visit
Admission: RE | Admit: 2012-02-25 | Discharge: 2012-02-25 | Disposition: A | Discharge status: Home / Self Care | Payer: Medicare Other | Source: Ambulatory Visit | Attending: Radiation Oncology | Admitting: Radiation Oncology

## 2012-02-25 ENCOUNTER — Ambulatory Visit: Payer: Medicare Other

## 2012-02-25 ENCOUNTER — Encounter (INDEPENDENT_AMBULATORY_CARE_PROVIDER_SITE_OTHER): Payer: Self-pay | Admitting: Surgery

## 2012-02-25 ENCOUNTER — Ambulatory Visit: Payer: Medicare Other | Admitting: Physical Therapy

## 2012-02-25 ENCOUNTER — Other Ambulatory Visit: Payer: Medicare Other | Admitting: Lab

## 2012-02-25 VITALS — BP 115/70 | HR 75 | Resp 18

## 2012-02-25 VITALS — BP 116/76 | HR 82 | Temp 98.0°F | Ht 61.0 in | Wt 134.0 lb

## 2012-02-25 DIAGNOSIS — D0590 Unspecified type of carcinoma in situ of unspecified breast: Secondary | ICD-10-CM

## 2012-02-25 DIAGNOSIS — C50419 Malignant neoplasm of upper-outer quadrant of unspecified female breast: Secondary | ICD-10-CM

## 2012-02-25 DIAGNOSIS — D0510 Intraductal carcinoma in situ of unspecified breast: Secondary | ICD-10-CM

## 2012-02-25 DIAGNOSIS — Z170 Estrogen receptor positive status [ER+]: Secondary | ICD-10-CM

## 2012-02-25 LAB — CBC WITH DIFFERENTIAL/PLATELET
BASO%: 0.8 % (ref 0.0–2.0)
LYMPH%: 20.6 % (ref 14.0–49.7)
MCHC: 33.7 g/dL (ref 31.5–36.0)
MONO#: 0.5 10*3/uL (ref 0.1–0.9)
NEUT#: 5.6 10*3/uL (ref 1.5–6.5)
Platelets: 355 10*3/uL (ref 145–400)
RBC: 4.49 10*6/uL (ref 3.70–5.45)
RDW: 14.2 % (ref 11.2–14.5)
WBC: 7.9 10*3/uL (ref 3.9–10.3)

## 2012-02-25 LAB — COMPREHENSIVE METABOLIC PANEL
ALT: 15 U/L (ref 0–35)
Albumin: 4.3 g/dL (ref 3.5–5.2)
Alkaline Phosphatase: 53 U/L (ref 39–117)
CO2: 33 mEq/L — ABNORMAL HIGH (ref 19–32)
Potassium: 3.6 mEq/L (ref 3.5–5.3)
Sodium: 136 mEq/L (ref 135–145)
Total Bilirubin: 0.2 mg/dL — ABNORMAL LOW (ref 0.3–1.2)
Total Protein: 7.2 g/dL (ref 6.0–8.3)

## 2012-02-25 NOTE — Progress Notes (Signed)
ID: Bonnie [name]   DOB: [birth year]  MR#: [id number]  UXL#:[id number]  HISTORY OF PRESENT ILLNESS: The patient had routine screening mammography at the breast Center 01/16/2012 suggesting a new area of distortion in the right breast. Additional views 02/11/2012 confirmed a spiculated mass in the subareolar portion of the right breast measuring 1 cm. This was not palpable. Ultrasound was negative.  Accordingly the patient underwent stereotactic biopsy of the right breast mass 02/13/2012 and this showed ([id number]) ductal carcinoma in situ, high grade, involving a complex sclerosing lesion. The tumor cells were 100% estrogen and progesterone receptor positive.   Bilateral breast MRI was performed 02/20/2012 this showed a 1.7 cm area of non-masslike enhancement in the right breast associated with a biopsy clip. There were no other suspicious areas in either breast.  Of note, the patient underwent [name] lumpectomy in 2003 for a grade 2 ductal carcinoma in situ, treated adjuvantly with radiation only  PAST MEDICAL HISTORY: Past Medical History  Diagnosis Date  . GERD (gastroesophageal reflux disease)   . Colitis, ischemic   . Osteoarthritis   . Breast cancer    adrenal insufficiency, hypercholesterolemia, depression, osteoporosis  PAST SURGICAL HISTORY: Past Surgical History  Procedure Date  . Tonsillectomy     At age 70  . Lumbar laminectomy   . Abdominal hysterectomy   . Bunionectomy     bilateral  . Rotor cuff     Right Shoulder  . Upper gastrointestinal endoscopy   . Kidney stone   . Knee surgery 1987    L  . Foot surgery     [name] foot 1994, right foot 1995  . Lumpectomy 2003  . Eye surgery 2005    FAMILY HISTORY Family History  Problem Relation Age of Onset  . Heart disease Brother   . Alcohol abuse Brother   . Alcohol abuse Brother   . Healthy Son    the patient's father died at the age of 69 from "old age". He has severe peripheral vascular disease. The patient's  mother also died at the age of 36. The patient had 2 full and 2 half-brothers and one half-sister. There is no history of breast or ovarian cancer in the family  GYNECOLOGIC HISTORY: Menarche age 31, first live birth age 65, the patient is GX P2,  SOCIAL HISTORY: She used to work from the [employer]. She is widowed and lives by herself. Today she is accompanied by her son Bonnie [name], who also lives in [place], and is retired, the other son is Bonnie [name]. Odis Luster, also living and [place]. The patient has 2 grandchildren and 4 great-grandchildren. She attends a [employer]   ADVANCED DIRECTIVES:  HEALTH MAINTENANCE: History  Substance Use Topics  . Smoking status: Former Smoker    Types: Cigarettes    Quit date: 09/21/1996  . Smokeless tobacco: Never Used   Comment: smoked to 3-4 cigartettes a day  . Alcohol Use: Yes     Occasional     Colonoscopy: 2012/[facility]  PAP:s/p hysterectomy  Bone density:osteoporosis (March 2006)  Lipid panel:  Allergies  Allergen Reactions  . Macrodantin Nausea Only    It also causes fever    Current Outpatient Prescriptions  Medication Sig Dispense Refill  . ALPRAZolam (XANAX) 0.25 MG tablet Take 0.25 mg by mouth as needed.        . citalopram (CELEXA) 20 MG tablet Take 20 mg by mouth as needed.        Marland Kitchen dexlansoprazole (DEXILANT) 60 MG  capsule Take 60 mg by mouth daily.        . diphenoxylate-atropine (LOMOTIL) 2.5-0.025 MG per tablet Take 1 tablet by mouth 4 (four) times daily as needed.      . hydrocortisone (CORTEF) 20 MG tablet Take 10 mg by mouth daily. 1/2 tablet daily       . mirtazapine (REMERON) 15 MG tablet Take 15 mg by mouth at bedtime.        . promethazine (PHENERGAN) 25 MG tablet Take 25 mg by mouth every 6 (six) hours as needed.      . simvastatin (ZOCOR) 40 MG tablet Take 40 mg by mouth at bedtime.        . traMADol (ULTRAM) 50 MG tablet Take 50 mg by mouth as needed.        . triamterene-hydrochlorothiazide  (MAXZIDE) 75-50 MG per tablet Take 1 tablet by mouth daily.      . Probiotic Product (ALIGN PO) Take by mouth. As Needed         REVIEW OF SYSTEMS: She had an episode of persistent diarrhea last year, but that has cleared. She is not as active as she used to be, she tells me, and agrees that she is a bit depressed, "because I can't do what I want to do". She does do all her housework, and occasionally "goes to the [place] boat". She wears hearing aids, has tinnitus, wears dentures, has a poor appetite, and sometimes has nausea when she eats. She feels forgetful and anxious. Overall however a detailed review of systems was noncontributory  OBJECTIVE: Elderly white woman in no acute distress Filed Vitals:   02/25/12 1240  BP: 116/76  Pulse: 82  Temp: 98 F (36.7 C)     Body mass index is 25.32 kg/(m^2).    ECOG FS: 1  Sclerae unicteric Oropharynx clear No peripheral adenopathy Lungs no rales or rhonchi Heart regular rate and rhythm Abd benign MSK no focal spinal tenderness, no peripheral edema Neuro: nonfocal Breasts: I do not palpate a mass in the right breast. There is no nipple retraction. There are no significant skin changes. The [name] breast is status post remote lumpectomy. There is no evidence of local recurrence  LAB RESULTS: Lab Results  Component Value Date   WBC 7.9 02/25/2012   NEUTROABS 5.6 02/25/2012   HGB 13.7 02/25/2012   HCT 40.7 02/25/2012   MCV 90.6 02/25/2012   PLT 355 02/25/2012      Chemistry      Component Value Date/Time   NA 136 02/25/2012 1210   K 3.6 02/25/2012 1210   CL 94* 02/25/2012 1210   CO2 33* 02/25/2012 1210   BUN 22 02/25/2012 1210   CREATININE 1.16* 02/25/2012 1210      Component Value Date/Time   CALCIUM 10.2 02/25/2012 1210   ALKPHOS 53 02/25/2012 1210   AST 19 02/25/2012 1210   ALT 15 02/25/2012 1210   BILITOT 0.2* 02/25/2012 1210       No results found for this basename: LABCA2    No components found with this basename:  LABCA125    No results found for this basename: INR:1;PROTIME:1 in the last 168 hours  Urinalysis    Component Value Date/Time   COLORURINE YELLOW 05/07/2011 1057   APPEARANCEUR CLEAR 05/07/2011 1057   LABSPEC 1.025 05/07/2011 1057   PHURINE 5.5 05/07/2011 1057   GLUCOSEU NEGATIVE 05/07/2011 1057   HGBUR TRACE* 05/07/2011 1057   BILIRUBINUR NEGATIVE 05/07/2011 1057  KETONESUR NEGATIVE 05/07/2011 1057   PROTEINUR NEGATIVE 05/07/2011 1057   UROBILINOGEN 0.2 05/07/2011 1057   NITRITE NEGATIVE 05/07/2011 1057   LEUKOCYTESUR SMALL* 05/07/2011 1057    STUDIES: US Breast Right  02/11/2012  *RADIOLOGY REPORT*  Clinical Data:  History of [name] breast cancer status post lumpectomy 2003.  Abnormal right screening mammogram  DIGITAL DIAGNOSTIC RIGHT MAMMOGRAM  AND RIGHT BREAST ULTRASOUND:  Comparison:  With priors  Findings:  Spot compression views of the right breast demonstrate development of a spiculated mass in the 12 o'clock subareolar portion of the right breast measuring 8 x 9 x 10 mm.  On physical exam, I do not palpate a mass in the right breast.  Ultrasound is performed, showing no abnormal shadowing, mass or distortion is detected.  IMPRESSION: Interval development of a small spiculated mass in the right breast.  This lesion cannot be seen sonographically.  Stereotactic biopsy has been scheduled at the patient's convenience.  BI-RADS CATEGORY 4:  Suspicious abnormality - biopsy should be considered.  Original Report Authenticated By: Littie Deeds. Judyann Munson, M.D.   Mr Breast Bilateral W Wo Contrast  02/20/2012  *RADIOLOGY REPORT*  Clinical Data: Recently diagnosed right breast complex sclerosing lesion with radial scar and high grade ductal carcinoma in situ. Status post [name] lumpectomy and radiation therapy for breast cancer in 2003.  BUN and creatinine were obtained on site at [facility] at [address] Results:  BUN 22 mg/dL,  Creatinine 1.4 mg/dL.  BILATERAL BREAST MRI WITH AND WITHOUT CONTRAST   Technique: Multiplanar, multisequence MR images of both breasts were obtained prior to and following the intravenous administration of 6ml of MultiHance.  Three dimensional images were evaluated at the independent DynaCad workstation.  Comparison:  Previous examinations, including the recent mammogram, ultrasound and biopsy examinations.  Findings: Mild background parenchymal enhancement both breasts. Post lumpectomy changes on the [name].  1.7 x 1.1 x 0.8 cm patchy area of non mass enhancement in the 12 o'clock position of the right breast.  This has predominately plateau enhancement kinetics.  There is a biopsy marker clip artifact at the superior aspect of this area of enhancement.  This is at the location of the recent biopsy.  No additional areas of enhancement or masses suspicious for malignancy in either breast.  No abnormal appearing lymph nodes.  IMPRESSION: 1.7 x 1.1 x 0.8 cm area of biopsy-proven malignancy in the 12 o'clock position of the right breast.  Otherwise, unremarkable examination.  THREE-DIMENSIONAL MR IMAGE RENDERING ON INDEPENDENT WORKSTATION:  Three-dimensional MR images were rendered by post-processing of the original MR data on an independent workstation.  The three- dimensional MR images were interpreted, and findings were reported in the accompanying complete MRI report for this study.  BI-RADS CATEGORY 6:  Known biopsy-proven malignancy - appropriate action should be taken.  Recommendation:  Treatment plan.  Original Report Authenticated By: Darrol Angel, M.D.    ASSESSMENT: 78 year old [place] woman  (1) s/p Right lumpectomy February 2003 for DCIS, grade 2, treated with adjuvant radiation  (2) s/p [name] breast biopsy 02/13/2012 for ductal carcinoma in situ, high-grade, strongly estrogen and progesterone receptor positive   PLAN: Ms. Denyse Amass case was presented this morning at the multidisciplinary breast cancer conference, and also discussed at the breast clinic today. The  consensus is that she will benefit from breast conservation therapy. Adjuvantly she may do radiation, anti-estrogens, or both. Our feeling was that she would gain more from the anti-estrogens, since they work systemically, and would  not only reduce the risk of this breast cancer recurring but also of a new breast cancer developing.  Given the patient's history of osteoporosis it may be that tamoxifen will be a good choice for her, but she would like to be followed to Dr. Mariel Sleet in [place], and I would leave that decision up to him (note that the patient's son [name] has seen Dr. Mariel Sleet previously for benign neutropenia). Accordingly we are making an appointment for the patient at the [facility] cancer clinic late April. She knows we will be glad to see her in the future if they need arises   Lucianne Smestad C    02/25/2012

## 2012-02-25 NOTE — Progress Notes (Signed)
Patient ID: Bonnie [name], female   DOB: 01-Nov-1927, 78 y.o.   MRN: [id number]  Chief Complaint  Patient presents with  . Other    right breast DCIS    HPI Bonnie [name] is a 78 y.o. female.   HPIshe is referred by Dr. Margo [name] to the M.D. [facility] after the  Recent diagnosis of ductal carcinoma in situ of the right breast. She has a previous history of left breast cancer status post radiation and lumpectomy in 2003. She currently has had no problems with her breasts. She denies nipple discharge or palpable masses. She is otherwise doing well and has no complaints.  Past Medical History  Diagnosis Date  . GERD (gastroesophageal reflux disease)   . Colitis, ischemic   . Osteoarthritis   . Breast cancer     Past Surgical History  Procedure Date  . Tonsillectomy     At age 57  . Lumbar laminectomy   . Abdominal hysterectomy   . Bunionectomy     bilateral  . Rotor cuff     Right Shoulder  . Upper gastrointestinal endoscopy   . Kidney stone   . Knee surgery 1987    L  . Foot surgery     left foot 1994, right foot 1995  . Lumpectomy 2003  . Eye surgery 2005    Family History  Problem Relation Age of Onset  . Heart disease Brother   . Alcohol abuse Brother   . Alcohol abuse Brother   . Healthy Son     Social History History  Substance Use Topics  . Smoking status: Former Smoker    Types: Cigarettes    Quit date: 09/21/1996  . Smokeless tobacco: Never Used   Comment: smoked to 3-4 cigartettes a day  . Alcohol Use: Yes     Occasional    Allergies  Allergen Reactions  . Macrodantin Nausea Only    It also causes fever    Current Outpatient Prescriptions  Medication Sig Dispense Refill  . ALPRAZolam (XANAX) 0.25 MG tablet Take 0.25 mg by mouth as needed.        . citalopram (CELEXA) 20 MG tablet Take 20 mg by mouth as needed.        Marland Kitchen dexlansoprazole (DEXILANT) 60 MG capsule Take 60 mg by mouth daily.        . diphenoxylate-atropine (LOMOTIL) 2.5-0.025 MG per tablet  Take 1 tablet by mouth 4 (four) times daily as needed.      . hydrocortisone (CORTEF) 20 MG tablet Take 10 mg by mouth daily. 1/2 tablet daily       . mirtazapine (REMERON) 15 MG tablet Take 15 mg by mouth at bedtime.        . Probiotic Product (ALIGN PO) Take by mouth. As Needed        . promethazine (PHENERGAN) 25 MG tablet Take 25 mg by mouth every 6 (six) hours as needed.      . simvastatin (ZOCOR) 40 MG tablet Take 40 mg by mouth at bedtime.        . traMADol (ULTRAM) 50 MG tablet Take 50 mg by mouth as needed.        . triamterene-hydrochlorothiazide (MAXZIDE) 75-50 MG per tablet Take 1 tablet by mouth daily.        Review of Systems Review of Systems  Constitutional: Positive for fatigue.  HENT: Positive for hearing loss and tinnitus.   Respiratory: Negative.   Cardiovascular: Negative.   Gastrointestinal: Negative.  Genitourinary: Negative.   Musculoskeletal: Positive for arthralgias.  Neurological: Negative.   Hematological: Negative.   Psychiatric/Behavioral: Negative.     Blood pressure 115/70, pulse 75, resp. rate 18.  Physical Exam Physical Exam  Constitutional: She is oriented to person, place, and time. She appears well-developed and well-nourished. No distress.  HENT:  Head: Normocephalic and atraumatic.  Right Ear: External ear normal.  Left Ear: External ear normal.  Nose: Nose normal.  Mouth/Throat: Oropharynx is clear and moist. No oropharyngeal exudate.  Eyes: Conjunctivae are normal. Pupils are equal, round, and reactive to light. Right eye exhibits no discharge. Left eye exhibits no discharge. No scleral icterus.  Neck: Normal range of motion. Neck supple. No tracheal deviation present. No thyromegaly present.  Cardiovascular: Normal rate, regular rhythm, normal heart sounds and intact distal pulses.   No murmur heard. Pulmonary/Chest: Effort normal and breath sounds normal. No respiratory distress. She has no wheezes. She has no rales.  Abdominal:  Soft. Bowel sounds are normal. She exhibits no distension. There is no tenderness. There is no rebound.  Musculoskeletal: Normal range of motion. She exhibits no edema and no tenderness.  Lymphadenopathy:    She has no cervical adenopathy.  Neurological: She is alert and oriented to person, place, and time.  Skin: Skin is warm and dry. No rash noted. No erythema.  Psychiatric: Her behavior is normal. Judgment normal.    Data Reviewed We have reviewed the mammograms, MRI, and biopsy results. Please confirm the ductal carcinoma in situ of the right breast which is ER and PR positive  Assessment    Carcinoma in situ of right breast    Plan    After a long discussion, we will be proceeding with a needle localized right breast lumpectomy. I discussed this with her in detail. I discussed the risks of surgery which includes but is not limited to bleeding, infection, injury to surrounding structures, need for further surgery, etc. She understands and wishes to proceed. Surgery will be scheduled.       Bonnie [name] A 02/25/2012, 2:21 PM

## 2012-02-25 NOTE — Progress Notes (Signed)
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 female who was diagnosed a decade ago with either DCIS or early stage left breast cancer, this was treated with lumpectomy followed by radiation. I do not have all of the records but she reports that she received her radiotherapy [facility]. She has recently found on mammography to have a 10 mm focus of distortion in the right breast, a small spiculated mass. Stereotactic biopsy was recommended and this revealed ductal carcinoma in situ, high-grade, with associated microcalcifications. It is ER and PR 100%. The patient is status post MRI on 02/20/2012 which confirms an area at the 12:00 region in the right breast that measures 1.7 cm consistent with her DCIS. Otherwise it was an unremarkable study. She is otherwise in her usual state of health. She is quite independent. She is here with her son today.  PREVIOUS RADIATION THERAPY: Yes- as above   PAST MEDICAL HISTORY:  has a past medical history of GERD (gastroesophageal reflux disease); Colitis, ischemic; Osteoarthritis; and Breast cancer.     PAST SURGICAL HISTORY:  Past Surgical History  Procedure Date  . Tonsillectomy     At age 75  . Lumbar laminectomy   . Abdominal hysterectomy   . Bunionectomy     bilateral  . Rotor cuff     Right Shoulder  . Upper gastrointestinal endoscopy   . Kidney stone   . Knee surgery 1987    L  . Foot surgery     left foot 1994, right foot 1995  . Lumpectomy 2003  . Eye surgery 2005   GYN history: first menstrual period was at age 72. She is no longer having periods. She she has carried 2 children to term. Her age at first live  birth was 6. She is not sure about any history of hormone replacement therapy but is not currently on any.  FAMILY HISTORY: family history includes Alcohol abuse in her brothers; Healthy in her son; and Heart disease in her brother. she denies any history of breast cancer or ovarian cancer in her family.   SOCIAL HISTORY:  reports that she quit smoking about 15 years ago. Her smoking use included Cigarettes. She has never used smokeless tobacco. She reports that she drinks alcohol. She reports that she does not use illicit drugs.   ALLERGIES: Macrodantin   MEDICATIONS:  Current Outpatient Prescriptions  Medication Sig Dispense Refill  . ALPRAZolam (XANAX) 0.25 MG tablet Take 0.25 mg by mouth as needed.        . citalopram (CELEXA) 20 MG tablet Take 20 mg by mouth as needed.        Marland Kitchen dexlansoprazole (DEXILANT) 60 MG capsule Take 60 mg by mouth daily.        . diphenoxylate-atropine (LOMOTIL) 2.5-0.025 MG per tablet Take 1 tablet by mouth 4 (four) times daily as needed.      . hydrocortisone (CORTEF) 20 MG tablet Take 10 mg by mouth daily. 1/2 tablet daily       . mirtazapine (REMERON) 15 MG tablet Take 15 mg by mouth at bedtime.        . Probiotic Product (ALIGN PO)  Take by mouth. As Needed        . promethazine (PHENERGAN) 25 MG tablet Take 25 mg by mouth every 6 (six) hours as needed.      . simvastatin (ZOCOR) 40 MG tablet Take 40 mg by mouth at bedtime.        . traMADol (ULTRAM) 50 MG tablet Take 50 mg by mouth as needed.        . triamterene-hydrochlorothiazide (MAXZIDE) 75-50 MG per tablet Take 1 tablet by mouth daily.         REVIEW OF SYSTEMS: Comprehensive review of systems is obtained and reviewed with the patient placed in her chart. It is notable for that above    PHYSICAL EXAM:  vitals were not taken for this visit.  General: Alert and oriented, in no acute distress HEENT: Head is normocephalic. Pupils are equally round and reactive to light. Extraocular movements are  intact. Oropharynx is clear. Neck: Neck is supple, no palpable cervical or supraclavicular lymphadenopathy. Heart: Regular in rate and rhythm with no murmurs, rubs, or gallops. Chest: Clear to auscultation bilaterally, with no rhonchi, wheezes, or rales. Abdomen: Soft, nontender, nondistended, with no rigidity or guarding. Extremities: No cyanosis or edema. Lymphatics: No concerning lymphadenopathy. Skin: No concerning lesions. Musculoskeletal: symmetric strength and muscle tone throughout. Neurologic: Cranial nerves II through XII are grossly intact. No obvious focalities. Speech is fluent. Coordination is intact. Psychiatric: Judgment and insight are intact. Affect is appropriate. Breast exam demonstrates a 3:00 scar in the left breast with a small tissue defect consistent with prior lumpectomy. The right breast is notable for a post biopsy hematoma in the 12:00 position. There are no concerning lesions palpable in either breast nor any palpable axillary lymphadenopathy.  LABORATORY DATA:  Lab Results  Component Value Date   WBC 7.9 02/25/2012   HGB 13.7 02/25/2012   HCT 40.7 02/25/2012   MCV 90.6 02/25/2012   PLT 355 02/25/2012   CMP     Component Value Date/Time   NA 136 02/25/2012 1210   K 3.6 02/25/2012 1210   CL 94* 02/25/2012 1210   CO2 33* 02/25/2012 1210   GLUCOSE 94 02/25/2012 1210   BUN 22 02/25/2012 1210   CREATININE 1.16* 02/25/2012 1210   CALCIUM 10.2 02/25/2012 1210   PROT 7.2 02/25/2012 1210   ALBUMIN 4.3 02/25/2012 1210   AST 19 02/25/2012 1210   ALT 15 02/25/2012 1210   ALKPHOS 53 02/25/2012 1210   BILITOT 0.2* 02/25/2012 1210   GFRNONAA 28* 05/07/2011 1143   GFRAA  Value: 34        The eGFR has been calculated using the MDRD equation. This calculation has not been validated in all clinical situations. eGFR's persistently <60 mL/min signify possible Chronic Kidney Disease.* 05/07/2011 1143     PATHOLOGY: As above   RADIOLOGY: As above   IMPRESSION/PLAN: This is a  delightful 78 year old woman with ER/PR positive high grade DCIS of the right breast. She has a prior history of lumpectomy and radiation to the left breast. She is enthusiastic about breast conservation. The plan is for her to undergo a lumpectomy followed by 5 years of anti-estrogen therapy. Since she is a good candidate for antiestrogen therapy, as a patient in her 21s with hormone receptor positive DCIS, I do not think that radiotherapy would provide a significant additional benefit. However, I explained to the patient that if she has positive margins, I could see her back to pursue hypofractionated radiotherapy over about 3-1/2 weeks. I  do deliver radiotherapy in [facility] which is closer to her home. I am happy to see her back there if needed.

## 2012-02-26 ENCOUNTER — Other Ambulatory Visit (INDEPENDENT_AMBULATORY_CARE_PROVIDER_SITE_OTHER): Payer: Self-pay | Admitting: Surgery

## 2012-02-26 DIAGNOSIS — D0510 Intraductal carcinoma in situ of unspecified breast: Secondary | ICD-10-CM

## 2012-02-27 ENCOUNTER — Encounter (HOSPITAL_COMMUNITY): Payer: Self-pay | Admitting: Pharmacy Technician

## 2012-03-01 ENCOUNTER — Telehealth: Payer: Self-pay | Admitting: *Deleted

## 2012-03-01 ENCOUNTER — Encounter: Payer: Self-pay | Admitting: *Deleted

## 2012-03-01 NOTE — Progress Notes (Signed)
Mailed after appt letter to pt. 

## 2012-03-01 NOTE — Telephone Encounter (Signed)
left patient voice message to inform her of the appointment for dr.neijstrom at [facility]

## 2012-03-05 ENCOUNTER — Telehealth: Payer: Self-pay | Admitting: *Deleted

## 2012-03-05 NOTE — Telephone Encounter (Signed)
Spoke to pt concerning [facility] from 02/25/12.  Pt denies questions or concerns regarding dx or treatment care plan.  Confirmed surgery date for 4/12 and f/u appt with Dr. Mariel Sleet for 4/22.  Encourage pt to call with needs. Received verbal understanding.  Contact information given.

## 2012-03-08 ENCOUNTER — Other Ambulatory Visit (HOSPITAL_COMMUNITY): Payer: Self-pay | Admitting: *Deleted

## 2012-03-09 ENCOUNTER — Encounter (HOSPITAL_COMMUNITY)
Admission: RE | Admit: 2012-03-09 | Discharge: 2012-03-09 | Disposition: A | Discharge status: Home / Self Care | Payer: Medicare Other | Source: Ambulatory Visit | Attending: Surgery | Admitting: Surgery

## 2012-03-09 ENCOUNTER — Encounter (HOSPITAL_COMMUNITY)
Admission: RE | Admit: 2012-03-09 | Discharge: 2012-03-09 | Disposition: A | Discharge status: Home / Self Care | Payer: Medicare Other | Source: Ambulatory Visit | Attending: Anesthesiology | Admitting: Anesthesiology

## 2012-03-09 ENCOUNTER — Encounter (HOSPITAL_COMMUNITY): Payer: Self-pay

## 2012-03-09 HISTORY — DX: Other adrenocortical insufficiency: E27.49

## 2012-03-09 HISTORY — DX: Unspecified hearing loss, unspecified ear: H91.90

## 2012-03-09 HISTORY — DX: Headache: R51

## 2012-03-09 HISTORY — DX: Anxiety disorder, unspecified: F41.9

## 2012-03-09 HISTORY — DX: Chronic idiopathic constipation: K59.04

## 2012-03-09 HISTORY — DX: Essential tremor: G25.0

## 2012-03-09 HISTORY — DX: Hyperlipidemia, unspecified: E78.5

## 2012-03-09 HISTORY — DX: Stress incontinence (female) (male): N39.3

## 2012-03-09 HISTORY — DX: Essential (primary) hypertension: I10

## 2012-03-09 HISTORY — DX: Personal history of urinary (tract) infections: Z87.440

## 2012-03-09 LAB — CBC
Hemoglobin: 14 g/dL (ref 12.0–15.0)
MCHC: 34.3 g/dL (ref 30.0–36.0)
RDW: 14.1 % (ref 11.5–15.5)

## 2012-03-09 LAB — BASIC METABOLIC PANEL
BUN: 20 mg/dL (ref 6–23)
GFR calc Af Amer: 50 mL/min — ABNORMAL LOW (ref >90)
GFR calc non Af Amer: 43 mL/min — ABNORMAL LOW (ref >90)
Potassium: 3.5 mEq/L (ref 3.5–5.1)

## 2012-03-09 LAB — SURGICAL PCR SCREEN
MRSA, PCR: NEGATIVE
Staphylococcus aureus: NEGATIVE

## 2012-03-09 NOTE — Consult Note (Signed)
Anesthesia:  Patient is an 78 year old female scheduled for a right breast needle localized lumpectomy on 03/12/12.  I saw her earlier today during her PAT appointment.  Her son was with her.  History includes former smoker, GERD, OA, anxiety, headaches, HOH, benign essential tremor, adrenal gland hypofunction--on chronic steroids, colitis (lymphocytic by recent GI notes--patient denies history of ischemic colitis), breast CA, HTN, memory difficulties, HLD.  She has had multiple surgeries, but none in about 10 years.  EKG today showed NSR, right BBB, LAFB, bifascicular block, LVH.  There is no comparison EKG available at her PCP office (Dr. Dwana Melena and previously Dr. Sherwood Gambler) or in [place].  Patient does not think that she has ever had an EKG.  She denies CP, SOB, edema.  She does her own shopping, housecleaning including vacuuming, and can go up a flight of stairs without any difficulty.  Heart has a RRR, lungs clear.  No carotid bruits or LE edema noted.  Labs acceptable.  CXR shows mild hyperinflation, but no active disease.  I reviewed above with Anesthesiologist Dr. Noreene Larsson.  Since she is asymptomatic and has reasonable exercise tolerance (MET >4), would plan to proceed with this procedure without additional testing.

## 2012-03-09 NOTE — Pre-Procedure Instructions (Signed)
20 Bonnie [name]  03/09/2012   Your procedure is scheduled on:  *Friday April 12**  Report to [facility] at *1245** PM ( go to breast center @ 12 15 ).  Call this number if you have problems the morning of surgery: [phone]   Remember:   Do not eat food:After Midnight.  May have clear liquids: up to 4 Hours before arrival.(0845 am)  Clear liquids include soda, tea, black coffee, apple or grape juice, broth.  Take these medicines the morning of surgery with A SIP OF WATER: *Xanax,Hydrocortisone,**   Do not wear jewelry, make-up or nail polish.  Do not wear lotions, powders, or perfumes.  Do not shave 48 hours prior to surgery.  Do not bring valuables to the hospital.  Contacts, dentures or bridgework may not be worn into surgery.  Leave suitcase in the car. After surgery it may be brought to your room.  For patients admitted to the hospital, checkout time is 11:00 AM the day of discharge.   Patients discharged the day of surgery will not be allowed to drive home.  Name and phone number of your driver: *[name], son**  Special Instructions: CHG Shower Use Special Wash: 1/2 bottle night before surgery and 1/2 bottle morning of surgery.   Please read over the following fact sheets that you were given: Pain Booklet, Coughing and Deep Breathing, MRSA Information and Surgical Site Infection Prevention

## 2012-03-11 MED ORDER — CEFAZOLIN SODIUM 1-5 GM-% IV SOLN
1.0000 g | INTRAVENOUS | Status: AC
  Administered 2012-03-12: 1 g via INTRAVENOUS
  Filled 2012-03-11: qty 50

## 2012-03-12 ENCOUNTER — Ambulatory Visit
Admission: RE | Admit: 2012-03-12 | Discharge: 2012-03-12 | Disposition: A | Discharge status: Home / Self Care | Payer: Medicare Other | Source: Ambulatory Visit | Attending: Surgery | Admitting: Surgery

## 2012-03-12 ENCOUNTER — Ambulatory Visit (HOSPITAL_COMMUNITY)
Admission: RE | Admit: 2012-03-12 | Discharge: 2012-03-12 | Disposition: A | Discharge status: Home / Self Care | Payer: Medicare Other | Source: Ambulatory Visit | Attending: Surgery | Admitting: Surgery

## 2012-03-12 ENCOUNTER — Encounter (HOSPITAL_COMMUNITY)
Admission: RE | Disposition: A | Discharge status: Home / Self Care | Payer: Self-pay | Source: Ambulatory Visit | Attending: Surgery

## 2012-03-12 ENCOUNTER — Ambulatory Visit (HOSPITAL_COMMUNITY): Payer: Medicare Other | Admitting: Vascular Surgery

## 2012-03-12 ENCOUNTER — Encounter (HOSPITAL_COMMUNITY): Payer: Self-pay | Admitting: Vascular Surgery

## 2012-03-12 DIAGNOSIS — Z01818 Encounter for other preprocedural examination: Secondary | ICD-10-CM | POA: Insufficient documentation

## 2012-03-12 DIAGNOSIS — D0590 Unspecified type of carcinoma in situ of unspecified breast: Secondary | ICD-10-CM

## 2012-03-12 DIAGNOSIS — D0510 Intraductal carcinoma in situ of unspecified breast: Secondary | ICD-10-CM

## 2012-03-12 DIAGNOSIS — K219 Gastro-esophageal reflux disease without esophagitis: Secondary | ICD-10-CM | POA: Insufficient documentation

## 2012-03-12 DIAGNOSIS — Z01812 Encounter for preprocedural laboratory examination: Secondary | ICD-10-CM | POA: Insufficient documentation

## 2012-03-12 SURGERY — BREAST LUMPECTOMY WITH NEEDLE LOCALIZATION
Anesthesia: General | Site: Breast | Laterality: Right | Wound class: Clean

## 2012-03-12 MED ORDER — 0.9 % SODIUM CHLORIDE (POUR BTL) OPTIME
TOPICAL | Status: DC | PRN
  Administered 2012-03-12: 1000 mL

## 2012-03-12 MED ORDER — FENTANYL CITRATE 0.05 MG/ML IJ SOLN
INTRAMUSCULAR | Status: DC | PRN
  Administered 2012-03-12 (×2): 50 ug via INTRAVENOUS

## 2012-03-12 MED ORDER — BUPIVACAINE-EPINEPHRINE 0.25% -1:200000 IJ SOLN
INTRAMUSCULAR | Status: DC | PRN
  Administered 2012-03-12: 10 mL

## 2012-03-12 MED ORDER — PROPOFOL 10 MG/ML IV BOLUS
INTRAVENOUS | Status: DC | PRN
  Administered 2012-03-12: 120 mg via INTRAVENOUS

## 2012-03-12 MED ORDER — ARTIFICIAL TEARS OP OINT
TOPICAL_OINTMENT | OPHTHALMIC | Status: DC | PRN
  Administered 2012-03-12: 1 via OPHTHALMIC

## 2012-03-12 MED ORDER — LACTATED RINGERS IV SOLN
INTRAVENOUS | Status: DC | PRN
  Administered 2012-03-12: 14:00:00 via INTRAVENOUS

## 2012-03-12 MED ORDER — ONDANSETRON HCL 4 MG/2ML IJ SOLN
INTRAMUSCULAR | Status: DC | PRN
  Administered 2012-03-12: 4 mg via INTRAVENOUS

## 2012-03-12 MED ORDER — HYDROMORPHONE HCL PF 1 MG/ML IJ SOLN
0.2500 mg | INTRAMUSCULAR | Status: DC | PRN
Start: 2012-03-12 — End: 2012-03-12

## 2012-03-12 MED ORDER — HYDROCODONE-ACETAMINOPHEN 5-325 MG PO TABS: 1.0000 | ORAL_TABLET | Freq: Four times a day (QID) | ORAL | Status: AC | PRN

## 2012-03-12 SURGICAL SUPPLY — 42 items
APL SKNCLS STERI-STRIP NONHPOA (GAUZE/BANDAGES/DRESSINGS) ×1
BENZOIN TINCTURE PRP APPL 2/3 (GAUZE/BANDAGES/DRESSINGS) ×2 IMPLANT
BLADE SURG 10 STRL SS (BLADE) ×2 IMPLANT
BLADE SURG 15 STRL LF DISP TIS (BLADE) ×1 IMPLANT
BLADE SURG 15 STRL SS (BLADE) ×2
CANISTER SUCTION 2500CC (MISCELLANEOUS) IMPLANT
CHLORAPREP W/TINT 26ML (MISCELLANEOUS) ×2 IMPLANT
CLOTH BEACON ORANGE TIMEOUT ST (SAFETY) ×2 IMPLANT
COVER SURGICAL LIGHT HANDLE (MISCELLANEOUS) ×2 IMPLANT
DEVICE DUBIN SPECIMEN MAMMOGRA (MISCELLANEOUS) ×2 IMPLANT
DRAPE CHEST BREAST 15X10 FENES (DRAPES) ×2 IMPLANT
DRSG TEGADERM 4X4.75 (GAUZE/BANDAGES/DRESSINGS) ×2 IMPLANT
ELECT CAUTERY BLADE 6.4 (BLADE) ×2 IMPLANT
ELECT REM PT RETURN 9FT ADLT (ELECTROSURGICAL) ×2
ELECTRODE REM PT RTRN 9FT ADLT (ELECTROSURGICAL) ×1 IMPLANT
GLOVE BIO SURGEON STRL SZ7.5 (GLOVE) ×1 IMPLANT
GLOVE BIOGEL PI IND STRL 7.5 (GLOVE) IMPLANT
GLOVE BIOGEL PI INDICATOR 7.5 (GLOVE) ×1
GLOVE SURG SIGNA 7.5 PF LTX (GLOVE) ×2 IMPLANT
GOWN PREVENTION PLUS XLARGE (GOWN DISPOSABLE) ×2 IMPLANT
GOWN STRL NON-REIN LRG LVL3 (GOWN DISPOSABLE) ×2 IMPLANT
KIT BASIN OR (CUSTOM PROCEDURE TRAY) ×2 IMPLANT
KIT MARKER MARGIN INK (KITS) ×1 IMPLANT
KIT ROOM TURNOVER OR (KITS) ×2 IMPLANT
NS IRRIG 1000ML POUR BTL (IV SOLUTION) ×2 IMPLANT
PACK SURGICAL SETUP 50X90 (CUSTOM PROCEDURE TRAY) ×2 IMPLANT
PAD ARMBOARD 7.5X6 YLW CONV (MISCELLANEOUS) ×2 IMPLANT
PENCIL BUTTON HOLSTER BLD 10FT (ELECTRODE) ×2 IMPLANT
SPONGE GAUZE 4X4 12PLY (GAUZE/BANDAGES/DRESSINGS) ×2 IMPLANT
SPONGE LAP 4X18 X RAY DECT (DISPOSABLE) ×2 IMPLANT
STRIP CLOSURE SKIN 1/2X4 (GAUZE/BANDAGES/DRESSINGS) ×2 IMPLANT
SUT MON AB 4-0 PC3 18 (SUTURE) ×2 IMPLANT
SUT SILK 2 0 SH (SUTURE) IMPLANT
SUT VIC AB 3-0 SH 27 (SUTURE) ×2
SUT VIC AB 3-0 SH 27XBRD (SUTURE) ×1 IMPLANT
SYR BULB 3OZ (MISCELLANEOUS) ×2 IMPLANT
SYR CONTROL 10ML LL (SYRINGE) ×2 IMPLANT
TAPE CLOTH SURG 6X10 WHT LF (GAUZE/BANDAGES/DRESSINGS) ×1 IMPLANT
TOWEL OR 17X24 6PK STRL BLUE (TOWEL DISPOSABLE) ×2 IMPLANT
TOWEL OR 17X26 10 PK STRL BLUE (TOWEL DISPOSABLE) ×2 IMPLANT
TUBE CONNECTING 12X1/4 (SUCTIONS) ×1 IMPLANT
YANKAUER SUCT BULB TIP NO VENT (SUCTIONS) ×1 IMPLANT

## 2012-03-12 NOTE — Op Note (Signed)
BREAST LUMPECTOMY WITH NEEDLE LOCALIZATION  Procedure Note  Bonnie [name] 03/12/2012   Pre-op Diagnosis: Right breast DCIS, atypical lobular hyperplasia      Post-op Diagnosis: same  Procedure(s): BREAST LUMPECTOMY WITH NEEDLE LOCALIZATION  Surgeon(s): Shelly Rubenstein, MD  Anesthesia: Choice  Staff:  Doy Mince, RN - Circulator Janeece Agee Pingue, CST - Scrub Person  Estimated Blood Loss: Minimal               Specimens: right breast lumpectomy         Indications: This is an 78 year old female with ductal carcinoma in situ of the right breast. The decision has been made to proceed with a needle localized right breast lumpectomy  Findings: The suspicious area was confirmed to be in the biopsy specimen by radiology.  Procedure: The patient was brought to the operating room and identified as the correct patient she was placed on the operating table and general anesthesia was induced. A localization wire had already been placed in the patient's right breast. An elliptical incision around the localization wire with a scalpel. I took this down to the breast tissue with electrocautery. I then performed a wide lumpectomy of a 12 position of the breast going down to the chest wall. Once the lumpectomy specimen was removed, it was x-rayed and the suspicious area and clip were confirmed to be in the biopsy specimen. I then marked all quadrants of the specimen with paint. The specimen was then sent to pathology for evaluation. I then irrigated with saline. Hemostasis was achieved with the cautery. Upon surgical clips from the margins of the biopsy cavity. I then closed the subcutaneous tissue with interrupted 3-0 Vicryl sutures and closed the skin with a running 4-0 Monocryl. Steri-Strips, gauze, and Tegaderm were then applied. The patient tolerated the procedure well. All the counts were correct at the end of the procedure. The patient was then extubated in the operating room and  taken in a stable condition to the recovery room. [name] A   Date: 03/12/2012  Time: 2:59 PM

## 2012-03-12 NOTE — Interval H&P Note (Signed)
History and Physical Interval Note:  She has had no change in her history or exam  03/12/2012 1:58 PM  Bonnie [name]  has presented today for surgery, with the diagnosis of Right breast DCIS, atypical lobular hyperplasia   The various methods of treatment have been discussed with the patient and family. After consideration of risks, benefits and other options for treatment, the patient has consented to  Procedure(s) (LRB): BREAST LUMPECTOMY WITH NEEDLE LOCALIZATION (Right) as a surgical intervention .  The patients' history has been reviewed, patient examined, no change in status, stable for surgery.  I have reviewed the patients' chart and labs.  Questions were answered to the patient's satisfaction.     Jamichael Knotts A

## 2012-03-12 NOTE — Discharge Instructions (Signed)
[facility] °Office Phone Number [phone] ° °BREAST BIOPSY/ PARTIAL MASTECTOMY: POST OP INSTRUCTIONS ° °Always review your discharge instruction sheet given to you by the facility where your surgery was performed. ° °IF YOU HAVE DISABILITY OR FAMILY LEAVE FORMS, YOU MUST BRING THEM TO THE OFFICE FOR PROCESSING.  DO NOT GIVE THEM TO YOUR DOCTOR. ° °1. A prescription for pain medication may be given to you upon discharge.  Take your pain medication as prescribed, if needed.  If narcotic pain medicine is not needed, then you may take acetaminophen (Tylenol) or ibuprofen (Advil) as needed. °2. Take your usually prescribed medications unless otherwise directed °3. If you need a refill on your pain medication, please contact your pharmacy.  They will contact our office to request authorization.  Prescriptions will not be filled after 5pm or on week-ends. °4. You should eat very light the first 24 hours after surgery, such as soup, crackers, pudding, etc.  Resume your normal diet the day after surgery. °5. Most patients will experience some swelling and bruising in the breast.  Ice packs and a good support bra will help.  Swelling and bruising can take several days to resolve.  °6. It is common to experience some constipation if taking pain medication after surgery.  Increasing fluid intake and taking a stool softener will usually help or prevent this problem from occurring.  A mild laxative (Milk of Magnesia or Miralax) should be taken according to package directions if there are no bowel movements after 48 hours. °7. Unless discharge instructions indicate otherwise, you may remove your bandages 24-48 hours after surgery, and you may shower at that time.  You may have steri-strips (small skin tapes) in place directly over the incision.  These strips should be left on the skin for 7-10 days.  If your surgeon used skin glue on the incision, you may shower in 24 hours.  The glue will flake off over the  next 2-3 weeks.  Any sutures or staples will be removed at the office during your follow-up visit. °8. ACTIVITIES:  You may resume regular daily activities (gradually increasing) beginning the next day.  Wearing a good support bra or sports bra minimizes pain and swelling.  You may have sexual intercourse when it is comfortable. °a. You may drive when you no longer are taking prescription pain medication, you can comfortably wear a seatbelt, and you can safely maneuver your car and apply brakes. °b. RETURN TO WORK:  ______________________________________________________________________________________ °9. You should see your doctor in the office for a follow-up appointment approximately two weeks after your surgery.  Your doctor’s nurse will typically make your follow-up appointment when she calls you with your pathology report.  Expect your pathology report 2-3 business days after your surgery.  You may call to check if you do not hear from us after three days. °10. OTHER INSTRUCTIONS: _______________________________________________________________________________________________ _____________________________________________________________________________________________________________________________________ °_____________________________________________________________________________________________________________________________________ °_____________________________________________________________________________________________________________________________________ ° °WHEN TO CALL YOUR DOCTOR: °1. Fever over 101.0 °2. Nausea and/or vomiting. °3. Extreme swelling or bruising. °4. Continued bleeding from incision. °5. Increased pain, redness, or drainage from the incision. ° °The clinic staff is available to answer your questions during regular business hours.  Please don’t hesitate to call and ask to speak to one of the nurses for clinical concerns.  If you have a medical emergency, go to the nearest  emergency room or call 911.  A surgeon from [facility] is always on call at the hospital. ° °For further questions, please visit [email or web address]  °

## 2012-03-12 NOTE — Anesthesia Postprocedure Evaluation (Signed)
  Anesthesia Post-op Note  Patient: Bonnie [name]  Procedure(s) Performed: Procedure(s) (LRB): BREAST LUMPECTOMY WITH NEEDLE LOCALIZATION (Right)  Patient Location: PACU  Anesthesia Type: General  Level of Consciousness: awake, alert  and oriented  Airway and Oxygen Therapy: Patient Spontanous Breathing  Post-op Pain: none  Post-op Assessment: Post-op Vital signs reviewed, Patient's Cardiovascular Status Stable, Respiratory Function Stable, Patent Airway, No signs of Nausea or vomiting and Pain level controlled  Post-op Vital Signs: Reviewed and stable  Complications: No apparent anesthesia complications

## 2012-03-12 NOTE — H&P (View-Only) (Signed)
Patient ID: Bonnie [name], female   DOB: [birth year], 78 y.o.   MRN: [id number]  Chief Complaint  Patient presents with  . Other    right breast DCIS    HPI Bonnie [name] is a 78 y.o. female.   HPIshe is referred by Dr. Margo Aye to the [facility] after the  Recent diagnosis of ductal carcinoma in situ of the right breast. She has a previous history of left breast cancer status post radiation and lumpectomy in 2003. She currently has had no problems with her breasts. She denies nipple discharge or palpable masses. She is otherwise doing well and has no complaints.  Past Medical History  Diagnosis Date  . GERD (gastroesophageal reflux disease)   . Colitis, ischemic   . Osteoarthritis   . Breast cancer     Past Surgical History  Procedure Date  . Tonsillectomy     At age 66  . Lumbar laminectomy   . Abdominal hysterectomy   . Bunionectomy     bilateral  . Rotor cuff     Right Shoulder  . Upper gastrointestinal endoscopy   . Kidney stone   . Knee surgery 1987    L  . Foot surgery     left foot 1994, right foot 1995  . Lumpectomy 2003  . Eye surgery 2005    Family History  Problem Relation Age of Onset  . Heart disease Brother   . Alcohol abuse Brother   . Alcohol abuse Brother   . Healthy Son     Social History History  Substance Use Topics  . Smoking status: Former Smoker    Types: Cigarettes    Quit date: 09/21/1996  . Smokeless tobacco: Never Used   Comment: smoked to 3-4 cigartettes a day  . Alcohol Use: Yes     Occasional    Allergies  Allergen Reactions  . Macrodantin Nausea Only    It also causes fever    Current Outpatient Prescriptions  Medication Sig Dispense Refill  . ALPRAZolam (XANAX) 0.25 MG tablet Take 0.25 mg by mouth as needed.        . citalopram (CELEXA) 20 MG tablet Take 20 mg by mouth as needed.        Bonnie [name] dexlansoprazole (DEXILANT) 60 MG capsule Take 60 mg by mouth daily.        . diphenoxylate-atropine (LOMOTIL) 2.5-0.025 MG per tablet  Take 1 tablet by mouth 4 (four) times daily as needed.      . hydrocortisone (CORTEF) 20 MG tablet Take 10 mg by mouth daily. 1/2 tablet daily       . mirtazapine (REMERON) 15 MG tablet Take 15 mg by mouth at bedtime.        . Probiotic Product (ALIGN PO) Take by mouth. As Needed        . promethazine (PHENERGAN) 25 MG tablet Take 25 mg by mouth every 6 (six) hours as needed.      . simvastatin (ZOCOR) 40 MG tablet Take 40 mg by mouth at bedtime.        . traMADol (ULTRAM) 50 MG tablet Take 50 mg by mouth as needed.        . triamterene-hydrochlorothiazide (MAXZIDE) 75-50 MG per tablet Take 1 tablet by mouth daily.        Review of Systems Review of Systems  Constitutional: Positive for fatigue.  HENT: Positive for hearing loss and tinnitus.   Respiratory: Negative.   Cardiovascular: Negative.   Gastrointestinal: Negative.  Genitourinary: Negative.   Musculoskeletal: Positive for arthralgias.  Neurological: Negative.   Hematological: Negative.   Psychiatric/Behavioral: Negative.     Blood pressure 115/70, pulse 75, resp. rate 18.  Physical Exam Physical Exam  Constitutional: She is oriented to person, place, and time. She appears well-developed and well-nourished. No distress.  HENT:  Head: Normocephalic and atraumatic.  Right Ear: External ear normal.  Left Ear: External ear normal.  Nose: Nose normal.  Mouth/Throat: Oropharynx is clear and moist. No oropharyngeal exudate.  Eyes: Conjunctivae are normal. Pupils are equal, round, and reactive to light. Right eye exhibits no discharge. Left eye exhibits no discharge. No scleral icterus.  Neck: Normal range of motion. Neck supple. No tracheal deviation present. No thyromegaly present.  Cardiovascular: Normal rate, regular rhythm, normal heart sounds and intact distal pulses.   No murmur heard. Pulmonary/Chest: Effort normal and breath sounds normal. No respiratory distress. She has no wheezes. She has no rales.  Abdominal:  Soft. Bowel sounds are normal. She exhibits no distension. There is no tenderness. There is no rebound.  Musculoskeletal: Normal range of motion. She exhibits no edema and no tenderness.  Lymphadenopathy:    She has no cervical adenopathy.  Neurological: She is alert and oriented to person, place, and time.  Skin: Skin is warm and dry. No rash noted. No erythema.  Psychiatric: Her behavior is normal. Judgment normal.    Data Reviewed We have reviewed the mammograms, MRI, and biopsy results. Please confirm the ductal carcinoma in situ of the right breast which is ER and PR positive  Assessment    Carcinoma in situ of right breast    Plan    After a long discussion, we will be proceeding with a needle localized right breast lumpectomy. I discussed this with her in detail. I discussed the risks of surgery which includes but is not limited to bleeding, infection, injury to surrounding structures, need for further surgery, etc. She understands and wishes to proceed. Surgery will be scheduled.       Josmar Messimer A 02/25/2012, 2:21 PM

## 2012-03-12 NOTE — Transfer of Care (Signed)
Immediate Anesthesia Transfer of Care Note  Patient: Bonnie [name]  Procedure(s) Performed: Procedure(s) (LRB): BREAST LUMPECTOMY WITH NEEDLE LOCALIZATION (Right)  Patient Location: PACU  Anesthesia Type: General  Level of Consciousness: awake, alert  and oriented  Airway & Oxygen Therapy: Patient Spontanous Breathing and Patient connected to nasal cannula oxygen  Post-op Assessment: Report given to PACU RN, Post -op Vital signs reviewed and stable and Patient moving all extremities  Post vital signs: Reviewed and stable  Complications: No apparent anesthesia complications

## 2012-03-12 NOTE — Anesthesia Preprocedure Evaluation (Signed)
Anesthesia Evaluation  Patient identified by MRN, date of birth, ID band Patient awake    Reviewed: Allergy & Precautions, NPO status , Patient's Chart, lab work & pertinent test results  History of Anesthesia Complications Negative for: history of anesthetic complications  Airway Mallampati: II TM Distance: >3 FB Neck ROM: Full    Dental  (+) Edentulous Upper and Dental Advisory Given   Pulmonary former smoker (quit 25 years) breath sounds clear to auscultation  Pulmonary exam normal       Cardiovascular hypertension, Pt. on medications Rhythm:Regular Rate:Normal     Neuro/Psych Anxiety    GI/Hepatic Neg liver ROS, GERD-  Medicated and Controlled,  Endo/Other  Adrenal dysfunction- treated with daily hydrocortisone, took today  Renal/GU negative Renal ROS     Musculoskeletal   Abdominal (+) + obese,   Peds  Hematology   Anesthesia Other Findings   Reproductive/Obstetrics                           Anesthesia Physical Anesthesia Plan  ASA: III  Anesthesia Plan: General   Post-op Pain Management:    Induction: Intravenous  Airway Management Planned: LMA  Additional Equipment:   Intra-op Plan:   Post-operative Plan:   Informed Consent: I have reviewed the patients History and Physical, chart, labs and discussed the procedure including the risks, benefits and alternatives for the proposed anesthesia with the patient or authorized representative who has indicated his/her understanding and acceptance.   Dental advisory given  Plan Discussed with: CRNA and Surgeon  Anesthesia Plan Comments:         Anesthesia Quick Evaluation

## 2012-03-17 ENCOUNTER — Telehealth (INDEPENDENT_AMBULATORY_CARE_PROVIDER_SITE_OTHER): Payer: Self-pay | Admitting: Surgery

## 2012-03-18 ENCOUNTER — Encounter (INDEPENDENT_AMBULATORY_CARE_PROVIDER_SITE_OTHER): Payer: Self-pay | Admitting: General Surgery

## 2012-03-22 ENCOUNTER — Encounter (HOSPITAL_COMMUNITY): Payer: Self-pay | Admitting: Oncology

## 2012-03-22 ENCOUNTER — Encounter (HOSPITAL_COMMUNITY): Payer: Medicare Other | Attending: Oncology | Admitting: Oncology

## 2012-03-22 VITALS — BP 136/80 | HR 89 | Temp 97.7°F | Ht 61.0 in | Wt 136.3 lb

## 2012-03-22 DIAGNOSIS — D0510 Intraductal carcinoma in situ of unspecified breast: Secondary | ICD-10-CM

## 2012-03-22 DIAGNOSIS — Z170 Estrogen receptor positive status [ER+]: Secondary | ICD-10-CM

## 2012-03-22 DIAGNOSIS — C50419 Malignant neoplasm of upper-outer quadrant of unspecified female breast: Secondary | ICD-10-CM

## 2012-03-22 DIAGNOSIS — D0590 Unspecified type of carcinoma in situ of unspecified breast: Secondary | ICD-10-CM

## 2012-03-22 NOTE — Patient Instructions (Addendum)
Bonnie [name]  [id number] 1927-07-02 Dr. Glenford Peers   [facility]  Discharge Instructions  RECOMMENDATIONS MADE BY THE CONSULTANT AND ANY TEST RESULTS WILL BE SENT TO YOUR REFERRING DOCTOR.   EXAM FINDINGS BY MD TODAY AND SIGNS AND SYMPTOMS TO REPORT TO CLINIC OR PRIMARY MD: Exam and discussion per MD.  Options would be radiation and/or Tamoxifen.  You have a good prognosis regardless of what you do.  MEDICATIONS PRESCRIBED: none   INSTRUCTIONS GIVEN AND DISCUSSED: Other :  Report any lumps, bone pain or shortness of breath.  SPECIAL INSTRUCTIONS/FOLLOW-UP: Need to do a bone density. Return to Clinic in 1 year.   I acknowledge that I have been informed and understand all the instructions given to me and received a copy. I do not have any more questions at this time, but understand that I may call the Specialty Clinic at [facility] at [phone] during business hours should I have any further questions or need assistance in obtaining follow-up care.    __________________________________________  _____________  __________ Signature of Patient or Authorized Representative            Date                   Time    __________________________________________ Nurse's Signature

## 2012-03-22 NOTE — Progress Notes (Signed)
This office note has been dictated.

## 2012-03-22 NOTE — Progress Notes (Signed)
CC:   Catalina Pizza, M.D. Abigail Miyamoto, M.D. Grayland Jack, M.D.  DIAGNOSES: 1. 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 2. Ductal carcinoma in situ of the left breast in 2003 that was     intermediate grade.  I do not have those measurements but she was     treated with surgery by Dr. Francina Ames followed by radiation     therapy, and she cannot remember the radiation therapist. 3. Gastroesophageal reflux disease. 4. Osteoarthritis.  This is a very pleasant lady who is accompanied by her son who is also a patient here at [facility].  She is 77, fully independent, living on her own.  There is a consultation already by Dr. Darnelle Catalan who saw her in [place] for this DCIS and since she was from up here and knew of me through her son she came here for followup.  She had already discussed with Dr. Darnelle Catalan the options of radiation therapy, hormonal therapy or potentially both.  She states that both of her parents basically lived to be 36 years old.  She does not smoke.  She occasionally uses alcohol.  She was a smoker in the past but quit in 1997.  She is widowed and, again, lives by herself, fully functional.  She at one time worked for the [employer] but is retired from that.  She had a 1st live birth at age 17 and had 2 pregnancies.  FAMILY HISTORY:  No family history of breast cancer or ovarian cancer.  PHYSICAL EXAMINATION:  A very pleasant lady in no acute distress.  She states that she has lost a half an inch in height over the years and is now 5 feet 1 inch tall, weight is 136 pounds, blood pressure 136/80 right arm sitting position.  Pulse 88 and regular, respirations 16 and unlabored.  She is afebrile.  She has no lymphadenopathy and the left breast has a scar which has healed well.  The right breast has a scar which is healing very well as well.  There are no masses in either breast.   She has a normal heart exam.  She has lungs which are clear to auscultation and percussion.  Abdomen is soft, nontender, without organomegaly or masses.  She is alert, she is oriented.  Facial symmetry is intact.  She has no peripheral edema of the arms or legs.  Azizah is a very, very pleasant 78 year old lady who has a good prognosis regardless of what we do.  She would rather avoid any medications especially after the side effects of these tamoxifen and aromatase inhibitor pills so she would like to pursue radiation therapy.  That treated her well, it was easy for her to tolerate, etc.  We will set up an appointment with either Dr. Basilio Cairo or Dr. Michell Heinrich, whichever is available and get her to be seen.  That is her desire is to pursue radiation therapy and not hormonal therapy at this time.  Her prognosis as I mentioned is good either way.  We will see her once a year going forward.  She is fine with that plan.    ______________________________ Ladona Horns. Mariel Sleet, MD ESN/MEDQ  D:  03/22/2012  T:  03/22/2012  Job:  [id number]

## 2012-03-30 ENCOUNTER — Ambulatory Visit (HOSPITAL_COMMUNITY)
Admission: RE | Admit: 2012-03-30 | Discharge: 2012-03-30 | Disposition: A | Discharge status: Home / Self Care | Payer: Medicare Other | Source: Ambulatory Visit | Attending: Oncology | Admitting: Oncology

## 2012-03-30 DIAGNOSIS — M810 Age-related osteoporosis without current pathological fracture: Secondary | ICD-10-CM | POA: Insufficient documentation

## 2012-03-30 DIAGNOSIS — D0510 Intraductal carcinoma in situ of unspecified breast: Secondary | ICD-10-CM

## 2012-03-30 DIAGNOSIS — D0590 Unspecified type of carcinoma in situ of unspecified breast: Secondary | ICD-10-CM | POA: Insufficient documentation

## 2012-03-30 DIAGNOSIS — Z780 Asymptomatic menopausal state: Secondary | ICD-10-CM | POA: Insufficient documentation

## 2012-04-02 ENCOUNTER — Ambulatory Visit (INDEPENDENT_AMBULATORY_CARE_PROVIDER_SITE_OTHER): Payer: Medicare Other | Admitting: Surgery

## 2012-04-02 ENCOUNTER — Encounter (INDEPENDENT_AMBULATORY_CARE_PROVIDER_SITE_OTHER): Payer: Self-pay | Admitting: Surgery

## 2012-04-02 VITALS — BP 102/90 | HR 80 | Temp 97.2°F | Resp 14 | Ht 62.0 in | Wt 135.4 lb

## 2012-04-02 DIAGNOSIS — Z09 Encounter for follow-up examination after completed treatment for conditions other than malignant neoplasm: Secondary | ICD-10-CM

## 2012-04-02 NOTE — Progress Notes (Signed)
Subjective:     Patient ID: Bonnie [name], female   DOB: 11-03-1927, 78 y.o.   MRN: [id number]  HPI She is here for her first postop visit status post right breast lumpectomy for ductal carcinoma in situ. She is doing well. She is orienting the oncologist and is planning only radiation therapy  Review of Systems     Objective:   Physical Exam On exam, her incision is well healed in the right breast    Assessment:     Patient status post right breast lumpectomy for ductal carcinoma in situ    Plan:     She will start radiation therapy soon. I will see her back in 6 months

## 2012-04-05 ENCOUNTER — Telehealth (HOSPITAL_COMMUNITY): Payer: Self-pay

## 2012-04-05 NOTE — Telephone Encounter (Signed)
Per son, she is seeing Dr. Basilio Cairo at [facility] tomorrow morning.

## 2012-04-05 NOTE — Telephone Encounter (Signed)
Message copied by Sterling Big on Mon Apr 05, 2012  1:31 PM ------      Message from: Mariel Sleet, ERIC S      Created: Mon Apr 05, 2012  1:16 PM       Did she get the xrt app't??

## 2012-06-08 ENCOUNTER — Other Ambulatory Visit: Payer: Self-pay | Admitting: Radiation Oncology

## 2012-06-08 DIAGNOSIS — D0510 Intraductal carcinoma in situ of unspecified breast: Secondary | ICD-10-CM

## 2012-06-09 ENCOUNTER — Telehealth: Payer: Self-pay | Admitting: *Deleted

## 2012-06-09 NOTE — Telephone Encounter (Signed)
Called patient to inform of test for 02-23-2013, spoke with patient and she is aware of this test

## 2012-08-27 ENCOUNTER — Other Ambulatory Visit (HOSPITAL_COMMUNITY): Payer: Self-pay | Admitting: Internal Medicine

## 2012-08-27 DIAGNOSIS — F0390 Unspecified dementia without behavioral disturbance: Secondary | ICD-10-CM

## 2012-08-31 ENCOUNTER — Ambulatory Visit (HOSPITAL_COMMUNITY)
Admission: RE | Admit: 2012-08-31 | Discharge: 2012-08-31 | Disposition: A | Discharge status: Home / Self Care | Payer: Medicare Other | Source: Ambulatory Visit | Attending: Internal Medicine | Admitting: Internal Medicine

## 2012-08-31 ENCOUNTER — Encounter (HOSPITAL_COMMUNITY): Payer: Self-pay

## 2012-08-31 DIAGNOSIS — F0390 Unspecified dementia without behavioral disturbance: Secondary | ICD-10-CM | POA: Insufficient documentation

## 2012-08-31 DIAGNOSIS — R51 Headache: Secondary | ICD-10-CM | POA: Insufficient documentation

## 2012-08-31 DIAGNOSIS — R4182 Altered mental status, unspecified: Secondary | ICD-10-CM | POA: Insufficient documentation

## 2012-09-15 ENCOUNTER — Encounter (INDEPENDENT_AMBULATORY_CARE_PROVIDER_SITE_OTHER): Payer: Self-pay | Admitting: Surgery

## 2012-09-15 ENCOUNTER — Ambulatory Visit (INDEPENDENT_AMBULATORY_CARE_PROVIDER_SITE_OTHER): Payer: Medicare Other | Admitting: Surgery

## 2012-09-15 VITALS — BP 123/75 | HR 84 | Temp 97.4°F | Resp 12 | Ht 62.0 in | Wt 124.6 lb

## 2012-09-15 DIAGNOSIS — Z853 Personal history of malignant neoplasm of breast: Secondary | ICD-10-CM

## 2012-09-15 NOTE — Progress Notes (Signed)
Subjective:     Patient ID: Bonnie [name], female   DOB: 1927-11-05, 78 y.o.   MRN: [id number]  HPI She is here for a long-term followup of her right breast DCIS and left breast cancer. She recently had a CAT scan of the head secondary to her mildly progressing dementia. She has no problems regarding her breasts.  Review of Systems     Objective:   Physical Exam On exam, there is minimal skin changes on the right from the radiation. There are no palpable masses in either breast no adenopathy on either side    Assessment:     Long-term followup breast cancer    Plan:     She will continue to be followed by the oncologist. I will see her back in one year

## 2012-12-11 ENCOUNTER — Emergency Department (HOSPITAL_COMMUNITY): Payer: Medicare Other

## 2012-12-11 ENCOUNTER — Emergency Department (HOSPITAL_COMMUNITY)
Admission: EM | Admit: 2012-12-11 | Discharge: 2012-12-11 | Disposition: A | Discharge status: Home / Self Care | Payer: Medicare Other | Attending: Emergency Medicine | Admitting: Emergency Medicine

## 2012-12-11 ENCOUNTER — Encounter (HOSPITAL_COMMUNITY): Payer: Self-pay | Admitting: *Deleted

## 2012-12-11 DIAGNOSIS — I10 Essential (primary) hypertension: Secondary | ICD-10-CM | POA: Insufficient documentation

## 2012-12-11 DIAGNOSIS — Z8679 Personal history of other diseases of the circulatory system: Secondary | ICD-10-CM | POA: Insufficient documentation

## 2012-12-11 DIAGNOSIS — Z87448 Personal history of other diseases of urinary system: Secondary | ICD-10-CM | POA: Insufficient documentation

## 2012-12-11 DIAGNOSIS — Z8659 Personal history of other mental and behavioral disorders: Secondary | ICD-10-CM | POA: Insufficient documentation

## 2012-12-11 DIAGNOSIS — Z853 Personal history of malignant neoplasm of breast: Secondary | ICD-10-CM | POA: Insufficient documentation

## 2012-12-11 DIAGNOSIS — Z8739 Personal history of other diseases of the musculoskeletal system and connective tissue: Secondary | ICD-10-CM | POA: Insufficient documentation

## 2012-12-11 DIAGNOSIS — R5381 Other malaise: Secondary | ICD-10-CM | POA: Insufficient documentation

## 2012-12-11 DIAGNOSIS — Z87898 Personal history of other specified conditions: Secondary | ICD-10-CM | POA: Insufficient documentation

## 2012-12-11 DIAGNOSIS — Z87891 Personal history of nicotine dependence: Secondary | ICD-10-CM | POA: Insufficient documentation

## 2012-12-11 DIAGNOSIS — Z79899 Other long term (current) drug therapy: Secondary | ICD-10-CM | POA: Insufficient documentation

## 2012-12-11 DIAGNOSIS — R55 Syncope and collapse: Secondary | ICD-10-CM | POA: Insufficient documentation

## 2012-12-11 DIAGNOSIS — Z87440 Personal history of urinary (tract) infections: Secondary | ICD-10-CM | POA: Insufficient documentation

## 2012-12-11 DIAGNOSIS — E785 Hyperlipidemia, unspecified: Secondary | ICD-10-CM | POA: Insufficient documentation

## 2012-12-11 DIAGNOSIS — Z8719 Personal history of other diseases of the digestive system: Secondary | ICD-10-CM | POA: Insufficient documentation

## 2012-12-11 LAB — CBC WITH DIFFERENTIAL/PLATELET
Basophils Absolute: 0 10*3/uL (ref 0.0–0.1)
Basophils Relative: 0 % (ref 0–1)
Eosinophils Absolute: 0 10*3/uL (ref 0.0–0.7)
HCT: 40.9 % (ref 36.0–46.0)
MCHC: 33.5 g/dL (ref 30.0–36.0)
Monocytes Absolute: 0.6 10*3/uL (ref 0.1–1.0)
Neutro Abs: 5.8 10*3/uL (ref 1.7–7.7)
Neutrophils Relative %: 84 % — ABNORMAL HIGH (ref 43–77)
RDW: 14.7 % (ref 11.5–15.5)

## 2012-12-11 LAB — COMPREHENSIVE METABOLIC PANEL
AST: 22 U/L (ref 0–37)
Albumin: 4.6 g/dL (ref 3.5–5.2)
Calcium: 10.6 mg/dL — ABNORMAL HIGH (ref 8.4–10.5)
Chloride: 99 mEq/L (ref 96–112)
Creatinine, Ser: 1.12 mg/dL — ABNORMAL HIGH (ref 0.50–1.10)
Total Bilirubin: 0.5 mg/dL (ref 0.3–1.2)
Total Protein: 7.6 g/dL (ref 6.0–8.3)

## 2012-12-11 NOTE — ED Notes (Signed)
Pt assisted to bedside commode, had large bowel movement, pt tolerated well.

## 2012-12-11 NOTE — ED Notes (Addendum)
Pt brought to er by [facility] with c/o near syncopal, pt was coming out of the bathroom at her family member;s house when she became diaphoretic, weak, pt confused on exactly what happened, unable to remember her address. States that she does have problems remembering things "at times", pt states "I feel crazy" denies any pain, admits to sob, skin cool and dry, Vital signs stable. EKG performed after arrival to tx room. Systolic bp with EMS was 100.

## 2012-12-11 NOTE — ED Provider Notes (Signed)
History    Scribed for Benny Lennert, MD, the patient was seen in room APA18/APA18. This chart was scribed by Katha Cabal.   CSN: [id number]  Arrival date & time 12/11/12  1250   First MD Initiated Contact with Patient 12/11/12 1316      Chief Complaint  Patient presents with  . Near Syncope    (Consider location/radiation/quality/duration/timing/severity/associated sxs/prior treatment) Patient is a 79 y.o. female presenting with weakness. The history is provided by the patient (the pt tried to have a bm and she became dizzy and weak). No language interpreter was used.  Weakness Primary symptoms do not include headaches. The symptoms began 1 to 2 hours ago. The symptoms are resolved. The neurological symptoms are multifocal. Context: nothing.  Additional symptoms include weakness.   Benny Lennert, MD entered patient's room at 1:17 PM   Bonnie [name] is a 79 y.o. female brought in by ambulance, who presents to the Emergency Department for syncopal episode just prior to arrival.  Family report that patient was talking and got up to go to the bathroom.  Coming from the bathroom patient reported being constipated.  Family member reports that patient began sweating and then fell towards to the floor.  Family was there to catch patient but patient was not able to get up on her own.  Family report that patient drove to his house earlier today for a visit.  Syncopal symptoms have resolved.  No previous episodes of syncope.  Denies any pain.  Symptoms are not associated with cough, fever and chills.   PCP Dwana Melena, MD     Past Medical History  Diagnosis Date  . GERD (gastroesophageal reflux disease)   . Colitis, ischemic   . Osteoarthritis   . Breast cancer   . Anxiety   . Stress incontinence, female   . Constipation - functional   . Hypertension   . Headache   . Hx: UTI (urinary tract infection)   . HOH (hard of hearing)     both ears; refuses to wear hearing aides  .  Memory difficulties   . Benign essential tremor     left hand; intermittent  . Hyperlipidemia   . Adrenal gland hypofunction     takes cortisone daily    Past Surgical History  Procedure Date  . Tonsillectomy     At age 35  . Lumbar laminectomy   . Abdominal hysterectomy   . Bunionectomy     bilateral  . Rotor cuff     Right Shoulder  . Upper gastrointestinal endoscopy   . Kidney stone   . Knee surgery 1987    L  . Foot surgery     left foot 1994, right foot 1995  . Lumpectomy 2003  . Eye surgery 2005  . Back surgery   . Breast surgery 2003    left breast  . Tonsillectomy   . Breast surgery 2013    Family History  Problem Relation Age of Onset  . Heart disease Brother   . Alcohol abuse Brother   . Alcohol abuse Brother   . Healthy Son   . Anesthesia problems Neg Hx     History  Substance Use Topics  . Smoking status: Former Smoker -- 0.5 packs/day for 20 years    Types: Cigarettes    Quit date: 09/21/1996  . Smokeless tobacco: Never Used     Comment: smoked to 3-4 cigartettes a day  . Alcohol Use: Yes  Comment: Occasional    OB History    Grav Para Term Preterm Abortions TAB SAB Ect Mult Living                  Review of Systems  Neurological: Positive for weakness. Negative for headaches.  All other systems reviewed and are negative.    Allergies  Macrodantin  Home Medications   Current Outpatient Rx  Name  Route  Sig  Dispense  Refill  . ALPRAZOLAM 0.5 MG PO TABS   Oral   Take 0.5 mg by mouth 4 (four) times daily as needed. anxiety         . ARIPIPRAZOLE 2 MG PO TABS   Oral   Take 2 mg by mouth daily.         . DULOXETINE HCL 60 MG PO CPEP   Oral   Take 60 mg by mouth daily.         Marland Kitchen HYDROCORTISONE 20 MG PO TABS   Oral   Take 10 mg by mouth daily. 1/2 tablet daily         . LOTEPREDNOL ETABONATE 0.5 % OP SUSP   Both Eyes   Place 1 drop into both eyes 4 (four) times daily.         . LUBIPROSTONE 24 MCG PO  CAPS   Oral   Take 24 mcg by mouth daily.         Marland Kitchen ONDANSETRON HCL 4 MG PO TABS   Oral   Take 4 mg by mouth every 8 (eight) hours as needed. nausea         . SIMVASTATIN 40 MG PO TABS   Oral   Take 40 mg by mouth at bedtime.          . TRIAMTERENE-HCTZ 75-50 MG PO TABS   Oral   Take 1 tablet by mouth daily.         Marland Kitchen DIPHENOXYLATE-ATROPINE 2.5-0.025 MG PO TABS   Oral   Take 1 tablet by mouth 4 (four) times daily as needed. diarrhea           BP 158/86  Pulse 89  Temp 97.5 F (36.4 C) (Oral)  Resp 17  SpO2 99%  Physical Exam  Constitutional: She is oriented to person, place, and time. She appears well-developed.  HENT:  Head: Normocephalic and atraumatic.  Eyes: Conjunctivae normal and EOM are normal. No scleral icterus.  Neck: Neck supple. No thyromegaly present.  Cardiovascular: Normal rate and regular rhythm.  Exam reveals no gallop and no friction rub.   No murmur heard. Pulmonary/Chest: No stridor. She has no wheezes. She has no rales. She exhibits no tenderness.  Abdominal: She exhibits no distension. There is no tenderness. There is no rebound.  Musculoskeletal: Normal range of motion. She exhibits no edema.  Lymphadenopathy:    She has no cervical adenopathy.  Neurological: She is alert and oriented to person, place, and time. Coordination normal.  Skin: No rash noted. No erythema.  Psychiatric: She has a normal mood and affect. Her behavior is normal.    ED Course  Procedures (including critical care time)    DIAGNOSTIC STUDIES: Oxygen Saturation is 98% on room air normal by my interpretation.     COORDINATION OF CARE: 1:22 PM  Physical exam complete.  Will order CXR.     LABS / RADIOLOGY:  Labs Reviewed  CBC WITH DIFFERENTIAL - Abnormal; Notable for the following:    Neutrophils Relative 84 (*)  Lymphocytes Relative 7 (*)     Lymphs Abs 0.5 (*)     All other components within normal limits  COMPREHENSIVE METABOLIC PANEL -  Abnormal; Notable for the following:    Glucose, Bld 135 (*)     Creatinine, Ser 1.12 (*)     Calcium 10.6 (*)     GFR calc non Af Amer 44 (*)     GFR calc Af Amer 51 (*)     All other components within normal limits  TROPONIN I   Dg Chest 1 View  12/11/2012  *RADIOLOGY REPORT*  Clinical Data: The weakness.  Hypertension and headaches.  CHEST - 1 VIEW  Comparison: 03/09/2012  Findings: Mild cardiac enlargement.  No pleural effusion or edema. Chronic interstitial coarsening appears similar to previous exam. No superimposed airspace consolidation identified.  Surgical clips identified within the projection of the right lung.  IMPRESSION:  1.  No acute cardiopulmonary abnormalities. 2.  Chronic interstitial coarsening.   Original Report Authenticated By: Signa Kell, M.D.    Ct Head Wo Contrast  12/11/2012  *RADIOLOGY REPORT*  Clinical Data: Near syncope, hypertension, headaches  CT HEAD WITHOUT CONTRAST  Technique:  Contiguous axial images were obtained from the base of the skull through the vertex without contrast.  Comparison: 08/31/2012  Findings: Stable brain atrophy and patchy subcortical white matter microvascular ischemic change.  No acute intracranial hemorrhage, acute infarction, mass lesion, midline shift, herniation, hydrocephalus, or extra-axial fluid collection.  Gray-white matter differentiation maintained.  Cisterns patent.  Cerebellar atrophy as well.  Mastoids and sinuses clear.  Orbits are symmetric.  No definite skull abnormality.  IMPRESSION: Stable exam.  Atrophy and microvascular ischemic changes.   Original Report Authenticated By: Judie Petit. Miles Costain, M.D.            IMPRESSION: No diagnosis found.   NEW MEDICATIONS: New Prescriptions   No medications on file      Date: 12/11/2012  Rate: 83  Rhythm: normal sinus rhythm  QRS Axis: normal  Intervals: normal  ST/T Wave abnormalities: normal  Conduction Disutrbances:right bundle branch block  Narrative Interpretation:    Old EKG Reviewed: none available          MDM         The chart was scribed for me under my direct supervision.  I personally performed the history, physical, and medical decision making and all procedures in the evaluation of this patient.Benny Lennert, MD 12/11/12 [phone]

## 2012-12-11 NOTE — ED Notes (Signed)
Dr Zammit at bedside,  

## 2012-12-11 NOTE — ED Notes (Signed)
Pt and family updated on delay in care,

## 2012-12-11 NOTE — ED Notes (Signed)
Family member reports that pt had went to the bathroom, had problems trying to use the bathroom, pt had been constipated, pt tried to use the bathroom for the second time, when pt came out of the bathroom pt broke out in sweat, became weak, did not fall because family member was able to "catch" pt and assist her to the couch then he called 911, pt unsure of when her last bowel movement was, states "I have problems with constipation" family members able to verify that pt does have problems remembering things sometimes, states that pt has had problems remembering things for the past 6 months, will be in a conversation and can't remember what she is talking about, pt is at baseline now per family.

## 2012-12-21 ENCOUNTER — Encounter (INDEPENDENT_AMBULATORY_CARE_PROVIDER_SITE_OTHER): Payer: Self-pay | Admitting: Internal Medicine

## 2012-12-21 ENCOUNTER — Ambulatory Visit (INDEPENDENT_AMBULATORY_CARE_PROVIDER_SITE_OTHER): Payer: Medicare Other | Admitting: Internal Medicine

## 2012-12-21 VITALS — BP 120/80 | HR 76 | Temp 97.3°F | Resp 18 | Ht 62.0 in | Wt 116.4 lb

## 2012-12-21 DIAGNOSIS — E2740 Unspecified adrenocortical insufficiency: Secondary | ICD-10-CM | POA: Insufficient documentation

## 2012-12-21 DIAGNOSIS — R413 Other amnesia: Secondary | ICD-10-CM | POA: Insufficient documentation

## 2012-12-21 DIAGNOSIS — I10 Essential (primary) hypertension: Secondary | ICD-10-CM | POA: Insufficient documentation

## 2012-12-21 DIAGNOSIS — E785 Hyperlipidemia, unspecified: Secondary | ICD-10-CM | POA: Insufficient documentation

## 2012-12-21 DIAGNOSIS — R197 Diarrhea, unspecified: Secondary | ICD-10-CM

## 2012-12-21 MED ORDER — PSYLLIUM 28 % PO PACK: 1.0000 | PACK | Freq: Every day | ORAL | Status: DC

## 2012-12-21 MED ORDER — DIPHENOXYLATE-ATROPINE 2.5-0.025 MG PO TABS: 1.0000 | ORAL_TABLET | Freq: Three times a day (TID) | ORAL | Status: DC

## 2012-12-21 NOTE — Progress Notes (Signed)
Presenting complaint;  Persistent diarrhea.  Subjective:  Patient is 79 year old Caucasian female was here for scheduled visit accompanied by her son [name]. She has history of constipation. She underwent colonoscopy by me in June 2012 and was noted to have lymphocytic colitis however she was not having diarrhea and no therapy was needed or rendered. Lately she's been constipated. On multiple occasions she had to digitally disimpact herself. She ended up in emergency room on 12/11/2012 for fecal impaction in while she was in emergency room she broke loose and ever since she has had diarrhea. She is on multiple watery stools she has had few accidents. She denies melena rectal bleeding or abdominal pain. She states her appetite is normal. She remains on Amitiza which was begun by Dr. Dwana Melena. She wants to go back to Dr. Sherwood Gambler. She left that practice because she she could not see Dr. Sherwood Gambler and was instead seen by [facility] extend[facility]. She has lost 20 pounds in the last 9 months. She believes all of the weight loss occurred following a hospitalization for breast surgery for carcinoma. She has not been taking Dexon for few weeks and denies heartburn or regurgitation.  Current Medications: Current Outpatient Prescriptions  Medication Sig Dispense Refill  . ALPRAZolam (XANAX) 0.5 MG tablet Take 0.5 mg by mouth 4 (four) times daily as needed. anxiety      . ARIPiprazole (ABILIFY) 2 MG tablet Take 2 mg by mouth daily.      . diphenoxylate-atropine (LOMOTIL) 2.5-0.025 MG per tablet Take 1 tablet by mouth 4 (four) times daily as needed. diarrhea      . DULoxetine (CYMBALTA) 60 MG capsule Take 60 mg by mouth daily.      Marland Kitchen HYDROCHLOROTHIAZIDE PO Take 12.5 mg by mouth daily.      . hydrocortisone (CORTEF) 20 MG tablet Take 10 mg by mouth daily. 1/2 tablet daily      . loteprednol (LOTEMAX) 0.5 % ophthalmic suspension Place 1 drop into both eyes 4 (four) times daily.      Marland Kitchen lubiprostone (AMITIZA) 24 MCG capsule  Take 24 mcg by mouth daily.      . Multiple Vitamins-Minerals (CENTRUM SILVER ADULT 50+ PO) Take by mouth daily.      . ondansetron (ZOFRAN) 4 MG tablet Take 4 mg by mouth every 8 (eight) hours as needed. nausea      . simvastatin (ZOCOR) 40 MG tablet Take 40 mg by mouth at bedtime.          Objective: Blood pressure 120/80, pulse 76, temperature 97.3 F (36.3 C), temperature source Oral, resp. rate 18, height 5\' 2"  (1.575 m), weight 116 lb 6.4 oz (52.799 kg). Patient is alert and in no acute distress. Conjunctiva is pink. Sclera is nonicteric Oropharyngeal mucosa is normal. No neck masses or thyromegaly noted. Cardiac exam with regular rhythm normal S1 and S2. No murmur or gallop noted. Lungs are clear to auscultation. Abdomen. Bowel sounds are normal. Abdomen is soft and nontender without organomegaly or masses. Rectal examination reveals no evidence of impaction. She has soft stool which is guaiac negative.  No LE edema or clubbing noted.  Labs/studies Results: Lab data from 12/11/2012. WBC 6.9 H&H 13.7 and 40.9 and platelet count 326K. Electrolytes were normal glucose 135, BUN 18, creatinine 1.12, serum calcium 10.6, and albumin was 4.6. AST 22 ALT 17, AP 62 and bilirubin oh 0.5.   Assessment:  Diarrhea in a patient with history of constipation. She was diagnosed with lymphocytic colitis back in  June 2012 and she had colonoscopy. No therapy was considered since she is been chronically constipated. She does not appear to have sporting his diarrhea since there is no evidence of fecal impaction on rectal examination. It is possible that diarrhea is secondary to lubiprostone or she could have had and infection.  Plan:  Discontinue lubiprostone(Amitiza0. Take Lomotil one tablet by mouth 3 times a day. Metamucil 3-4 g by mouth daily. Use glycerin or Dulcolax suppository on as-needed basis. Keep stool diary until office visit in 2 weeks.

## 2012-12-21 NOTE — Patient Instructions (Signed)
Discontinue amitiza for now. Metamucil 3-4 g by mouth daily at bedtime. Can use glycerin or Dulcolax suppository on as-needed basis. Keep stool diary as to frequency and consistency of stools until office visit.

## 2013-01-04 ENCOUNTER — Ambulatory Visit (INDEPENDENT_AMBULATORY_CARE_PROVIDER_SITE_OTHER): Payer: Medicare Other | Admitting: Internal Medicine

## 2013-01-04 ENCOUNTER — Encounter (INDEPENDENT_AMBULATORY_CARE_PROVIDER_SITE_OTHER): Payer: Self-pay | Admitting: Internal Medicine

## 2013-01-04 VITALS — BP 120/74 | HR 72 | Temp 99.3°F | Resp 18 | Ht 62.0 in | Wt 123.1 lb

## 2013-01-04 DIAGNOSIS — K5900 Constipation, unspecified: Secondary | ICD-10-CM

## 2013-01-04 DIAGNOSIS — K5909 Other constipation: Secondary | ICD-10-CM

## 2013-01-04 HISTORY — DX: Other constipation: K59.09

## 2013-01-04 MED ORDER — DOCUSATE SODIUM 100 MG PO CAPS: 200.0000 mg | ORAL_CAPSULE | Freq: Every day | ORAL | Status: DC

## 2013-01-04 NOTE — Progress Notes (Signed)
Presenting complaint;  Followup for irregular bowel movements.  Subjective:  Patient is 79 year old Caucasian female who is here for scheduled visit accompanied by her son [name]. She was last seen 2 weeks ago and she was having multiple loose stools. Lubiprostone was discontinued and she was begun on Lomotil and Metamucil. She now returns stating that she is constipated. She went 6 days without a bowel movement. She had a bowel movement yesterday and has multiple balls. She denies abdominal pain nausea or vomiting. She has not been using suppository as recommended.  Current Medications: Current Outpatient Prescriptions  Medication Sig Dispense Refill  . ALPRAZolam (XANAX) 0.5 MG tablet Take 0.5 mg by mouth 4 (four) times daily as needed. anxiety      . ARIPiprazole (ABILIFY) 2 MG tablet Take 2 mg by mouth daily.      . diphenoxylate-atropine (LOMOTIL) 2.5-0.025 MG per tablet Take 1 tablet by mouth 3 (three) times daily before meals. diarrhea  90 tablet  0  . DULoxetine (CYMBALTA) 60 MG capsule Take 60 mg by mouth daily.      Marland Kitchen HYDROCHLOROTHIAZIDE PO Take 12.5 mg by mouth daily.      . hydrocortisone (CORTEF) 20 MG tablet Take 10 mg by mouth daily. 1/2 tablet daily      . loteprednol (LOTEMAX) 0.5 % ophthalmic suspension Place 1 drop into both eyes 4 (four) times daily.      . Multiple Vitamins-Minerals (CENTRUM SILVER ADULT 50+ PO) Take by mouth daily.      . ondansetron (ZOFRAN) 4 MG tablet Take 4 mg by mouth every 8 (eight) hours as needed. nausea      . psyllium (METAMUCIL SMOOTH TEXTURE) 28 % packet Take 1 packet by mouth at bedtime.      . simvastatin (ZOCOR) 40 MG tablet Take 40 mg by mouth at bedtime.          Objective: Blood pressure 120/74, pulse 72, temperature 99.3 F (37.4 C), temperature source Oral, resp. rate 18, height 5\' 2"  (1.575 m), weight 123 lb 1.6 oz (55.838 kg). Patient is alert and in no acute distress. Conjunctiva is pink. Sclera is nonicteric Oropharyngeal  mucosa is normal. No neck masses or thyromegaly noted. Abdomen is symmetrical soft and nontender without organomegaly or masses No LE edema or clubbing noted.   Assessment:  Irregular bowel movements. It appears patient's underlying problem is chronic constipation. Diarrhea was result of medications. Will try simple remedies before she is back on lubiprostone or other agents. She needs to make sure that she has at least 3 bowel movements a week.   Plan:  Continue high fiber diet.  She should an apple every day in addition to high fiber diet. Colace 200 mg by mouth each bedtime. Continue Metamucil or Benefiber 3-4 g by mouth daily. Use glycerin or Dulcolax suppository every second day as needed. Stool diary until office visit in two months.   Cc Dr. Marlowe Kays, MD

## 2013-01-04 NOTE — Patient Instructions (Signed)
Can use glycerin or Dulcolax suppository every other day as needed. Stool diary as to frequency and consistency of stools until next office visit.

## 2013-02-18 ENCOUNTER — Encounter: Payer: Self-pay | Admitting: Radiation Oncology

## 2013-02-23 ENCOUNTER — Encounter: Payer: Self-pay | Admitting: Radiation Oncology

## 2013-02-23 ENCOUNTER — Ambulatory Visit (HOSPITAL_COMMUNITY)
Admission: RE | Admit: 2013-02-23 | Discharge: 2013-02-23 | Disposition: A | Discharge status: Home / Self Care | Payer: Medicare Other | Source: Ambulatory Visit | Attending: Radiation Oncology | Admitting: Radiation Oncology

## 2013-02-23 DIAGNOSIS — Z853 Personal history of malignant neoplasm of breast: Secondary | ICD-10-CM | POA: Insufficient documentation

## 2013-02-23 DIAGNOSIS — D0510 Intraductal carcinoma in situ of unspecified breast: Secondary | ICD-10-CM

## 2013-02-25 ENCOUNTER — Encounter: Payer: Self-pay | Admitting: Radiation Oncology

## 2013-02-25 ENCOUNTER — Ambulatory Visit
Admission: RE | Admit: 2013-02-25 | Discharge: 2013-02-25 | Disposition: A | Discharge status: Home / Self Care | Payer: Medicare Other | Source: Ambulatory Visit | Attending: Radiation Oncology | Admitting: Radiation Oncology

## 2013-02-25 VITALS — BP 126/74 | HR 67 | Temp 97.4°F | Wt 126.2 lb

## 2013-02-25 DIAGNOSIS — R5383 Other fatigue: Secondary | ICD-10-CM

## 2013-02-25 DIAGNOSIS — C50411 Malignant neoplasm of upper-outer quadrant of right female breast: Secondary | ICD-10-CM

## 2013-02-25 HISTORY — DX: Other fatigue: R53.83

## 2013-02-25 HISTORY — DX: Other constipation: K59.09

## 2013-02-25 HISTORY — DX: Personal history of irradiation: Z92.3

## 2013-02-25 NOTE — Progress Notes (Signed)
Radiation Oncology         [phone] ________________________________  Name: Bonnie [name] MRN: [id number]  Date: 02/25/2013  DOB: 1927/02/23  Follow-Up Visit Note  outpatient  CC: Cassell Smiles., MD  Dwana Melena, MD  Diagnosis:   Right breast DCIS, ER/PR positive, high-grade Prior history of left breast cancer treated with lumpectomy and adjuvant radiotherapy approximately 11 years ago  Interval Since Last Radiation:  For the patient's right breast DCIS, she completed a total dose of 50.05 gray in 20 fractions on 05/13/2012 at [facility]  Narrative:  The patient returns today for routine follow-up.  The patient's son provides part of her history. He reports that weeks after she completed radiotherapy, she developed a depressed mood and fatigue. Her depressed mood has improved but she still very fatigued. She is undergoing vitamin B12 shots. She has been taking Abilify [place] but they have concerns regarding the affordability of her antidepressant medications. It is unclear if she has undergone any thyroid function tests recently. I do not see any in our electronic medical record. She has followup with her primary Dr., Dr. Sherwood Gambler, in the next week or so. Mammogram on     02/23/2013 was BI-RADS 2                       ALLERGIES:  is allergic to macrodantin.  Meds: Current Outpatient Prescriptions  Medication Sig Dispense Refill  . ALPRAZolam (XANAX) 0.5 MG tablet Take 0.5 mg by mouth 4 (four) times daily as needed. anxiety      . ARIPiprazole (ABILIFY) 2 MG tablet Take 2 mg by mouth daily.      . diphenoxylate-atropine (LOMOTIL) 2.5-0.025 MG per tablet Take 1 tablet by mouth as needed for diarrhea or loose stools.      . docusate sodium (COLACE) 100 MG capsule Take 2 capsules (200 mg total) by mouth at bedtime.    0  . DULoxetine (CYMBALTA) 60 MG capsule Take 60 mg by mouth daily.      Marland Kitchen HYDROCHLOROTHIAZIDE PO Take 12.5 mg by mouth daily.      .  hydrocortisone (CORTEF) 20 MG tablet Take 10 mg by mouth daily. 1/2 tablet daily      . loteprednol (LOTEMAX) 0.5 % ophthalmic suspension Place 1 drop into both eyes 4 (four) times daily.      Marland Kitchen lubiprostone (AMITIZA) 24 MCG capsule Take 24 mcg by mouth as needed for constipation.      . Multiple Vitamins-Minerals (CENTRUM SILVER ADULT 50+ PO) Take by mouth daily.      . ondansetron (ZOFRAN) 4 MG tablet Take 4 mg by mouth every 8 (eight) hours as needed. nausea      . psyllium (METAMUCIL SMOOTH TEXTURE) 28 % packet Take 1 packet by mouth at bedtime.      . simvastatin (ZOCOR) 40 MG tablet Take 40 mg by mouth at bedtime.        No current facility-administered medications for this encounter.    Physical Findings: The patient is in no acute distress. Patient is alert and oriented.  weight is 126 lb 3.2 oz (57.244 kg). Her temperature is 97.4 F (36.3 C). Her blood pressure is 126/74 and her pulse is 67. Marland Kitchen  No significant changes. She appears tired. No palpable lesions of concern in either breast. The skin over her right breast is slightly hyperpigmented and dry. No palpable axillary adenopathy bilaterally. No palpable cervical or supraclavicular adenopathy  Lab  Findings: Lab Results  Component Value Date   WBC 6.9 12/11/2012   HGB 13.7 12/11/2012   HCT 40.9 12/11/2012   MCV 91.9 12/11/2012   PLT 326 12/11/2012    CMP     Component Value Date/Time   NA 137 12/11/2012 1325   K 3.5 12/11/2012 1325   CL 99 12/11/2012 1325   CO2 27 12/11/2012 1325   GLUCOSE 135* 12/11/2012 1325   BUN 18 12/11/2012 1325   CREATININE 1.12* 12/11/2012 1325   CALCIUM 10.6* 12/11/2012 1325   PROT 7.6 12/11/2012 1325   ALBUMIN 4.6 12/11/2012 1325   AST 22 12/11/2012 1325   ALT 17 12/11/2012 1325   ALKPHOS 62 12/11/2012 1325   BILITOT 0.5 12/11/2012 1325   GFRNONAA 44* 12/11/2012 1325   GFRAA 51* 12/11/2012 1325   No results found for this basename: TSH    Radiographic Findings: Mm Digital Diagnostic  Bilat  02/23/2013  *RADIOLOGY REPORT*  Clinical Data:  History of surgical excision for DCIS with a complex sclerosing lesion in the right breast 2013.  History of left breast cancer status post lumpectomy and radiation therapy 2003.  DIGITAL DIAGNOSTIC BILATERAL MAMMOGRAM WITH CAD  Comparison: With priors  Findings:  ACR Breast Density Category 2: There is a scattered fibroglandular pattern.  There is no new suspicious mass or malignant-type microcalcifications in either breast.   Lumpectomy changes are seen in both breast.  Mammographic images were processed with CAD.  IMPRESSION: No evidence of malignancy in either breast.  RECOMMENDATION: Bilateral diagnostic mammogram in 1 year is recommended.  I have discussed the findings and recommendations with the patient. Results were also provided in writing at the conclusion of the visit.  BI-RADS CATEGORY 2:  Benign finding(s).   Original Report Authenticated By: Baird Lyons, M.D.     Impression/Plan: This is a lovely 79 year old woman with a history of right breast DCIS and prior left-sided breast cancer treated a decade earlier. No evidence of disease.  She does have substantial fatigue which could be related to aging, but it progressed more abruptly been usual after completing radiotherapy. She also has had some depression address of the antidepressants. This seems to be better. I had a discussion with the patient and her son about her excellent prognosis from a cancer standpoint. I do not think this is the underlying cause of her anxiety or depression. She does not seem nervous about this. I do not think her fatigue is typical from her treatments. I recommend that she followup with her primary doctor to rule out reversible causes of fatigue. Of note she is undergoing B12 shots and has a normal Hgb.  1) I recommended vitamin E lotion over her right breast a little bit dry 2) followup with Dr. Sherwood Gambler as planned, recommended to discuss thyroid function tests if  not ordered already in his clinic, to address her fatigue. The patient some will also talk to him about her antidepressants. 3) patient and son understand she needs to continue with yearly mammography. I will see her back in early April 2015 following her next mammogram.   I spent 20 minutes minutes face to face with the patient and more than 50% of that time was spent in counseling and/or coordination of care. _____________________________________   Lonie Peak, MD

## 2013-02-25 NOTE — Progress Notes (Signed)
Bonnie [name] here for assessment of her breast.  She c/o radiation therapy to the right breast in June of 2013.  Her main concern today is constant fatigue.  Her last Hgb was 13.7 on 12/11/12.  Her son reports she has received several B12 injections but her fatigue remains the same.  She has uncontrolled diarrhea at times and constipation as well.  She is being followed by Dr.  Junious Dresser, Gastroenterologist in [place]

## 2013-03-01 ENCOUNTER — Encounter (INDEPENDENT_AMBULATORY_CARE_PROVIDER_SITE_OTHER): Payer: Self-pay | Admitting: Internal Medicine

## 2013-03-01 ENCOUNTER — Ambulatory Visit (INDEPENDENT_AMBULATORY_CARE_PROVIDER_SITE_OTHER): Payer: Medicare Other | Admitting: Internal Medicine

## 2013-03-01 VITALS — BP 130/80 | HR 76 | Temp 97.8°F | Resp 18 | Ht 62.0 in | Wt 126.1 lb

## 2013-03-01 DIAGNOSIS — K5900 Constipation, unspecified: Secondary | ICD-10-CM

## 2013-03-01 DIAGNOSIS — K5909 Other constipation: Secondary | ICD-10-CM

## 2013-03-01 NOTE — Progress Notes (Signed)
Presenting complaint;  Followup for constipation.  Subjective:  Patient is 79 year old Caucasian female who is here for scheduled visit. She has history of chronic constipation but in January she was seen for diarrhea and 2 months ago she was back to having constipation. She states she is feeling well. She generally has one BM per week. She denies abdominal pain rectal bleeding nausea or vomiting. Her weight is up by 3 pounds. Her appetite is fair. She states she does not take psyllium daily.  Current Medications: Current Outpatient Prescriptions  Medication Sig Dispense Refill  . ALPRAZolam (XANAX) 0.5 MG tablet Take 0.5 mg by mouth 4 (four) times daily as needed. anxiety      . ARIPiprazole (ABILIFY) 2 MG tablet Take 2 mg by mouth daily.      . diphenoxylate-atropine (LOMOTIL) 2.5-0.025 MG per tablet Take 1 tablet by mouth as needed for diarrhea or loose stools.      . docusate sodium (COLACE) 100 MG capsule Take 2 capsules (200 mg total) by mouth at bedtime.    0  . DULoxetine (CYMBALTA) 60 MG capsule Take 60 mg by mouth daily.      Marland Kitchen HYDROCHLOROTHIAZIDE PO Take 12.5 mg by mouth daily.      . hydrocortisone (CORTEF) 20 MG tablet Take 10 mg by mouth daily. 1/2 tablet daily      . loteprednol (LOTEMAX) 0.5 % ophthalmic suspension Place 1 drop into both eyes 4 (four) times daily.      Marland Kitchen lubiprostone (AMITIZA) 24 MCG capsule Take 24 mcg by mouth as needed for constipation.      . Multiple Vitamins-Minerals (CENTRUM SILVER ADULT 50+ PO) Take by mouth daily.      . ondansetron (ZOFRAN) 4 MG tablet Take 4 mg by mouth every 8 (eight) hours as needed. nausea      . psyllium (METAMUCIL SMOOTH TEXTURE) 28 % packet Take 1 packet by mouth at bedtime.      . simvastatin (ZOCOR) 40 MG tablet Take 40 mg by mouth at bedtime.        No current facility-administered medications for this visit.     Objective: Blood pressure 130/80, pulse 76, temperature 97.8 F (36.6 C), temperature source Oral, resp.  rate 18, height 5\' 2"  (1.575 m), weight 126 lb 1.6 oz (57.199 kg). Patient is alert and in no acute distress. Conjunctiva is pink. Sclera is nonicteric Oropharyngeal mucosa is normal. No neck masses or thyromegaly noted. Abdomen is soft and nontender without organomegaly or masses.  No LE edema or clubbing noted.   Assessment:  #1. Chronic constipation. She is doing well but only having one BM per week. She is at risk to develop fecal impaction if she goes any longer. Therefore she needs to have at least 2 BMs every week. She needs to take a fiber supplement every day rather than when necessary.   Plan:  Patient advised to take psyllium daily. Continue high fiber diet and stool softener. Will leave her on current dose of lubiprostone for now. Use Dulcolax suppository every third day unless has one spontaneous BM every 3 days. Office visit in 4 months.

## 2013-03-01 NOTE — Patient Instructions (Signed)
Continue high fiber diet and take Metamucil or psyllium every day. Can use Dulcolax suppository every third day if needed. Goal is for you to have 2 bowel movements per week.

## 2013-03-21 ENCOUNTER — Ambulatory Visit (HOSPITAL_COMMUNITY): Payer: Medicare Other | Admitting: Oncology

## 2013-03-22 ENCOUNTER — Ambulatory Visit (HOSPITAL_COMMUNITY): Payer: Medicare Other | Admitting: Oncology

## 2013-04-05 ENCOUNTER — Encounter (HOSPITAL_COMMUNITY): Payer: Self-pay | Admitting: Oncology

## 2013-04-05 ENCOUNTER — Encounter (HOSPITAL_COMMUNITY): Payer: Medicare Other | Attending: Oncology | Admitting: Oncology

## 2013-04-05 VITALS — BP 109/71 | HR 82 | Temp 97.8°F | Resp 16 | Wt 121.8 lb

## 2013-04-05 DIAGNOSIS — D0511 Intraductal carcinoma in situ of right breast: Secondary | ICD-10-CM

## 2013-04-05 DIAGNOSIS — D0590 Unspecified type of carcinoma in situ of unspecified breast: Secondary | ICD-10-CM

## 2013-04-05 DIAGNOSIS — K5900 Constipation, unspecified: Secondary | ICD-10-CM

## 2013-04-05 NOTE — Patient Instructions (Addendum)
[facility] Discharge Instructions  RECOMMENDATIONS MADE BY THE CONSULTANT AND ANY TEST RESULTS WILL BE SENT TO YOUR REFERRING PHYSICIAN.  You need to have at least 2-3 bowel movements per week. Continue to follow Dr.Rehman's recommendations. Follow up with Dr.Rehman as scheduled. Report any issues/concerns to this clinic as needed prior to appointments. Return to see MD in 6 months.  Thank you for choosing [facility] to provide your oncology and hematology care.  To afford each patient quality time with our providers, please arrive at least 15 minutes before your scheduled appointment time.  With your help, our goal is to use those 15 minutes to complete the necessary work-up to ensure our physicians have the information they need to help with your evaluation and healthcare recommendations.    Effective January 1st, 2014, we ask that you re-schedule your appointment with our physicians should you arrive 10 or more minutes late for your appointment.  We strive to give you quality time with our providers, and arriving late affects you and other patients whose appointments are after yours.    Again, thank you for choosing [facility].  Our hope is that these requests will decrease the amount of time that you wait before being seen by our physicians.       _____________________________________________________________  Should you have questions after your visit to [facility], please contact our office at [phone] between the hours of 8:30 a.m. and 5:00 p.m.  Voicemails left after 4:30 p.m. will not be returned until the following business day.  For prescription refill requests, have your pharmacy contact our office with your prescription refill request.

## 2013-04-05 NOTE — Progress Notes (Signed)
#  1 DCIS of the right breast, 1.8 cm in size, high-grade, 100% ER/PR positive now status post radiation therapy. She was not interested in taking hormonal therapy. #2 DCIS of the left breast in 2003 also treated with surgery followed by radiation therapy. This was an intermediate grade DCIS. #3 chronic constipation which is quite significant #4 GERD #5 DJD  She is here today with her son and she states she is doing well but in talking to her further she has seen Dr. Dionicia Abler on several occasions now with severe constipation. She does not follow all of his recommendations. We will over his initial recommendations couple months ago and then went over the visit that she had with him in April.  She has stopped eating apples, taking her softeners, etc. she does not always take her amitiza.  From the standpoint of the breast cancer she is not aware of any new lumps or bumps anywhere etc. The rest of her oncology review of systems is negative. She still living on her home in a townhouse.  She is in no acute distress. Vital signs are stable. Lymph nodes are negative throughout. Both breasts are negative for masses. Surgical changes are the only thing that are obvious. Lungs are clear to auscultation and percussion. Heart shows a regular rhythm and rate without obvious murmur rub or gallop. Abdomen is soft and nontender without organomegaly. She has no leg edema or arm edema.  We'll see her back in 6 months and then we will go to once a year followup. We talked extensively about her bowel issues and the need to follow the recommendations of Dr. Karilyn Cota.

## 2013-05-11 ENCOUNTER — Other Ambulatory Visit (INDEPENDENT_AMBULATORY_CARE_PROVIDER_SITE_OTHER): Payer: Self-pay | Admitting: Internal Medicine

## 2013-07-04 ENCOUNTER — Ambulatory Visit (INDEPENDENT_AMBULATORY_CARE_PROVIDER_SITE_OTHER): Payer: Medicare Other | Admitting: Internal Medicine

## 2013-07-26 ENCOUNTER — Encounter (INDEPENDENT_AMBULATORY_CARE_PROVIDER_SITE_OTHER): Payer: Self-pay | Admitting: Internal Medicine

## 2013-07-26 ENCOUNTER — Ambulatory Visit (INDEPENDENT_AMBULATORY_CARE_PROVIDER_SITE_OTHER): Payer: Medicare Other | Admitting: Internal Medicine

## 2013-07-26 VITALS — BP 126/74 | HR 76 | Temp 97.0°F | Resp 18 | Ht 62.0 in | Wt 130.3 lb

## 2013-07-26 DIAGNOSIS — K5900 Constipation, unspecified: Secondary | ICD-10-CM

## 2013-07-26 DIAGNOSIS — K5909 Other constipation: Secondary | ICD-10-CM

## 2013-07-26 NOTE — Patient Instructions (Signed)
Remember to take fiber supplement every day.

## 2013-07-26 NOTE — Progress Notes (Signed)
Presenting complaint;  Follow for chronic constipation.  Subjective:  Patient is a 79-year-old Caucasian female who presents for scheduled visit accompanied by her son [name]. She was last seen in April 2014. She feels she is doing better. She is having 4-5 bowel movements every week. She has sense of complete evaluation with some of her bowel movements but not all. She has not taken Lomotil said her last visit. She has taken Epsom salt and fleet enema once in the last 3 years. Her son states she does not take fiber supplement as recommended. Her appetite is fair and her weight is up about 4 pounds  Current Medications: Current Outpatient Prescriptions  Medication Sig Dispense Refill  . ALPRAZolam (XANAX) 0.5 MG tablet Take 0.5 mg by mouth 4 (four) times daily as needed. anxiety      . diphenoxylate-atropine (LOMOTIL) 2.5-0.025 MG per tablet TAKE (1) TABLET THREE TIMES DAILY, BEFORE MEALS FOR DIARRHEA.  90 tablet  0  . docusate sodium (COLACE) 100 MG capsule Take 2 capsules (200 mg total) by mouth at bedtime.    0  . DULoxetine (CYMBALTA) 60 MG capsule Take 60 mg by mouth daily. Take with 30 mg cap for total dose 90 mg      . HYDROCHLOROTHIAZIDE PO Take 12.5 mg by mouth daily.      . hydrocortisone (CORTEF) 20 MG tablet Take 10 mg by mouth daily. 1/2 tablet daily      . loteprednol (LOTEMAX) 0.5 % ophthalmic suspension Place 1 drop into both eyes 4 (four) times daily.      Marland Kitchen lubiprostone (AMITIZA) 24 MCG capsule Take 24 mcg by mouth as needed for constipation.      . Multiple Vitamins-Minerals (CENTRUM SILVER ADULT 50+ PO) Take by mouth daily.      . ondansetron (ZOFRAN) 4 MG tablet Take 4 mg by mouth every 8 (eight) hours as needed. nausea      . psyllium (METAMUCIL SMOOTH TEXTURE) 28 % packet Take 1 packet by mouth at bedtime.      . simvastatin (ZOCOR) 40 MG tablet Take 40 mg by mouth at bedtime.        No current facility-administered medications for this visit.     Objective: Blood  pressure 126/74, pulse 76, temperature 97 F (36.1 C), temperature source Oral, resp. rate 18, height 5\' 2"  (1.575 m), weight 130 lb 4.8 oz (59.104 kg). Patient is alert and in no acute distress. Conjunctiva is pink. Sclera is nonicteric Oropharyngeal mucosa is normal. No neck masses or thyromegaly noted. Abdomen symmetrical soft and nontender without organomegaly or masses.  No LE edema or clubbing noted.    Assessment:  #1. Chronic constipation. She is having at least 4 bowel movements a week. It appears she has at least 2 good evacuations every week. She needs to increase intake of fiber rich foods which would provide further benefit.    Plan: Diphenoxylate removed from the list of her medications as she has not taken in several months. Patient advised that she must take fiber supplement daily rather than on when necessary basis. She will use Epsom salt water fleets enema on as-needed basis. Continue colace and amitiza at current dose. Office visit in 6 months

## 2013-08-09 ENCOUNTER — Emergency Department (HOSPITAL_COMMUNITY)
Admission: EM | Admit: 2013-08-09 | Discharge: 2013-08-09 | Disposition: A | Discharge status: Home / Self Care | Payer: Medicare Other | Attending: Emergency Medicine | Admitting: Emergency Medicine

## 2013-08-09 ENCOUNTER — Encounter (HOSPITAL_COMMUNITY): Payer: Self-pay | Admitting: *Deleted

## 2013-08-09 ENCOUNTER — Emergency Department (HOSPITAL_COMMUNITY): Payer: Medicare Other

## 2013-08-09 DIAGNOSIS — N390 Urinary tract infection, site not specified: Secondary | ICD-10-CM

## 2013-08-09 DIAGNOSIS — Z87891 Personal history of nicotine dependence: Secondary | ICD-10-CM | POA: Insufficient documentation

## 2013-08-09 DIAGNOSIS — K5909 Other constipation: Secondary | ICD-10-CM | POA: Insufficient documentation

## 2013-08-09 DIAGNOSIS — I10 Essential (primary) hypertension: Secondary | ICD-10-CM | POA: Insufficient documentation

## 2013-08-09 DIAGNOSIS — E785 Hyperlipidemia, unspecified: Secondary | ICD-10-CM | POA: Insufficient documentation

## 2013-08-09 DIAGNOSIS — S42023A Displaced fracture of shaft of unspecified clavicle, initial encounter for closed fracture: Secondary | ICD-10-CM | POA: Insufficient documentation

## 2013-08-09 DIAGNOSIS — Z8739 Personal history of other diseases of the musculoskeletal system and connective tissue: Secondary | ICD-10-CM | POA: Insufficient documentation

## 2013-08-09 DIAGNOSIS — R42 Dizziness and giddiness: Secondary | ICD-10-CM | POA: Insufficient documentation

## 2013-08-09 DIAGNOSIS — Z8719 Personal history of other diseases of the digestive system: Secondary | ICD-10-CM | POA: Insufficient documentation

## 2013-08-09 DIAGNOSIS — Z79899 Other long term (current) drug therapy: Secondary | ICD-10-CM | POA: Insufficient documentation

## 2013-08-09 DIAGNOSIS — W230XXA Caught, crushed, jammed, or pinched between moving objects, initial encounter: Secondary | ICD-10-CM | POA: Insufficient documentation

## 2013-08-09 DIAGNOSIS — Z923 Personal history of irradiation: Secondary | ICD-10-CM | POA: Insufficient documentation

## 2013-08-09 DIAGNOSIS — Z853 Personal history of malignant neoplasm of breast: Secondary | ICD-10-CM | POA: Insufficient documentation

## 2013-08-09 DIAGNOSIS — F411 Generalized anxiety disorder: Secondary | ICD-10-CM | POA: Insufficient documentation

## 2013-08-09 DIAGNOSIS — K5900 Constipation, unspecified: Secondary | ICD-10-CM | POA: Insufficient documentation

## 2013-08-09 DIAGNOSIS — Y9389 Activity, other specified: Secondary | ICD-10-CM | POA: Insufficient documentation

## 2013-08-09 DIAGNOSIS — S42001A Fracture of unspecified part of right clavicle, initial encounter for closed fracture: Secondary | ICD-10-CM

## 2013-08-09 DIAGNOSIS — R110 Nausea: Secondary | ICD-10-CM | POA: Insufficient documentation

## 2013-08-09 DIAGNOSIS — Y929 Unspecified place or not applicable: Secondary | ICD-10-CM | POA: Insufficient documentation

## 2013-08-09 LAB — URINALYSIS, ROUTINE W REFLEX MICROSCOPIC
Glucose, UA: NEGATIVE mg/dL
Nitrite: POSITIVE — AB
Protein, ur: NEGATIVE mg/dL
Urobilinogen, UA: 0.2 mg/dL (ref 0.0–1.0)

## 2013-08-09 LAB — BASIC METABOLIC PANEL
Chloride: 96 mEq/L (ref 96–112)
GFR calc Af Amer: 64 mL/min — ABNORMAL LOW (ref >90)
Potassium: 3.4 mEq/L — ABNORMAL LOW (ref 3.5–5.1)
Sodium: 136 mEq/L (ref 135–145)

## 2013-08-09 LAB — URINE MICROSCOPIC-ADD ON

## 2013-08-09 LAB — CBC
Platelets: 375 10*3/uL (ref 150–400)
RDW: 14.1 % (ref 11.5–15.5)
WBC: 7 10*3/uL (ref 4.0–10.5)

## 2013-08-09 MED ORDER — CIPROFLOXACIN HCL 500 MG PO TABS: 500.0000 mg | ORAL_TABLET | Freq: Two times a day (BID) | ORAL | Status: DC

## 2013-08-09 MED ORDER — HYDROCODONE-ACETAMINOPHEN 5-325 MG PO TABS: 1.0000 | ORAL_TABLET | Freq: Three times a day (TID) | ORAL | Status: DC | PRN

## 2013-08-09 MED ORDER — CEFTRIAXONE SODIUM 1 G IJ SOLR
1.0000 g | Freq: Once | INTRAMUSCULAR | Status: AC
  Administered 2013-08-09: 1 g via INTRAVENOUS
  Filled 2013-08-09: qty 10

## 2013-08-09 NOTE — ED Provider Notes (Signed)
CSN: [id number]     Arrival date & time 08/09/13  1157 History   This chart was scribed for Dagmar Hait, MD by Caryn Bee, ED Scribe. This patient was seen in room APA07/APA07 and the patient's care was started 1:36 PM.    Chief Complaint  Patient presents with  . Fall    Patient is a 79 y.o. female presenting with fall. The history is provided by the patient. No language interpreter was used.  Fall This is a new problem. The current episode started 2 days ago. The problem occurs rarely. The problem has been gradually worsening. Pertinent negatives include no chest pain, no headaches and no shortness of breath. Associated symptoms comments: Right shoulder pain. Exacerbated by: Movement. Nothing relieves the symptoms. She has tried nothing for the symptoms.   HPI Comments: Bonnie [name] is a 80 y.o. female who presents to the Emergency Department complaining of gradual onset, severe right collarbone and lateral shoulder pain onset after a fall that occured 2 nights ago while trying to get out of bed. She states that this pain is worsened with raising her right arm. She states that there is about 6 inches between the bed and the wall and was wedged between her bed and the wall. Pt states she did not hit the floor. She is unsure whether or not she had LOC, but she denies head injury. Pt's relative reports that pt has not had any other falls in the past few days. Pt states she has been feeling sick and also reports dizziness and nausea this morning. Pt denies fevers, chest pain, trouble breathing, neck pain, or emesis. Pt occasionally has UTIs. Pt's son reports pt is taking hydrocortisone and hydrochlorothiazide as prescribed daily.  Pt's PCP is Dr. Raina Mina.  Past Medical History  Diagnosis Date  . GERD (gastroesophageal reflux disease)   . Colitis, ischemic   . Osteoarthritis   . Breast cancer     Right Breast - May 2013/ Left Breast - 2003  . Anxiety   . Stress  incontinence, female   . Constipation - functional   . Hypertension   . Headache(784.0)   . Hx: UTI (urinary tract infection)   . HOH (hard of hearing)     both ears; refuses to wear hearing aides  . Memory difficulties   . Benign essential tremor     left hand; intermittent  . Hyperlipidemia   . Adrenal gland hypofunction     takes cortisone daily  . S/P radiation therapy 04/15/12 -05/13/12    RIGHT BREAST  . Chronic constipation 01/04/13  . Fatigue 02/25/13    chronic   Past Surgical History  Procedure Laterality Date  . Tonsillectomy      At age 74  . Lumbar laminectomy    . Abdominal hysterectomy    . Bunionectomy      bilateral  . Rotor cuff      Right Shoulder  . Upper gastrointestinal endoscopy    . Kidney stone    . Knee surgery  1987    L  . Foot surgery      left foot 1994, right foot 1995  . Lumpectomy  2003  . Eye surgery  2005  . Back surgery    . Breast surgery  2003    left breast  . Tonsillectomy    . Breast surgery  2013   Family History  Problem Relation Age of Onset  . Heart disease Brother   .  Alcohol abuse Brother   . Alcohol abuse Brother   . Healthy Son   . Anesthesia problems Neg Hx    History  Substance Use Topics  . Smoking status: Former Smoker -- 0.50 packs/day for 20 years    Types: Cigarettes    Quit date: 09/21/1996  . Smokeless tobacco: Never Used     Comment: smoked to 3-4 cigartettes a day  . Alcohol Use: Yes     Comment: Occasional   OB History   Grav Para Term Preterm Abortions TAB SAB Ect Mult Living                 Review of Systems  Constitutional: Negative for fever.  HENT: Negative for neck pain.   Eyes: Negative for discharge.  Respiratory: Negative for cough and shortness of breath.   Cardiovascular: Negative for chest pain.  Gastrointestinal: Positive for nausea. Negative for vomiting and diarrhea.  Genitourinary: Negative for frequency and hematuria.  Musculoskeletal: Positive for arthralgias (right  shoulder pain). Negative for back pain.  Skin: Positive for color change (bruising to right chest). Negative for rash.  Neurological: Positive for dizziness. Negative for seizures and headaches.  Psychiatric/Behavioral: Negative for hallucinations.  All other systems reviewed and are negative.   Allergies  Macrodantin  Home Medications   Current Outpatient Rx  Name  Route  Sig  Dispense  Refill  . ALPRAZolam (XANAX) 0.5 MG tablet   Oral   Take 0.5 mg by mouth 4 (four) times daily as needed. anxiety         . docusate sodium (COLACE) 100 MG capsule   Oral   Take 2 capsules (200 mg total) by mouth at bedtime.      0   . DULoxetine (CYMBALTA) 60 MG capsule   Oral   Take 120 mg by mouth daily. Take with 30 mg cap for total dose 90 mg         . HYDROCHLOROTHIAZIDE PO   Oral   Take 12.5 mg by mouth daily.         . hydrocortisone (CORTEF) 20 MG tablet   Oral   Take 10 mg by mouth daily. 1/2 tablet daily         . loteprednol (LOTEMAX) 0.5 % ophthalmic suspension   Both Eyes   Place 1 drop into both eyes 4 (four) times daily.         Marland Kitchen lubiprostone (AMITIZA) 24 MCG capsule   Oral   Take 24 mcg by mouth daily with breakfast.          . Multiple Vitamins-Minerals (CENTRUM SILVER ADULT 50+ PO)   Oral   Take by mouth daily.         . ondansetron (ZOFRAN) 4 MG tablet   Oral   Take 4 mg by mouth every 8 (eight) hours as needed. nausea         . psyllium (METAMUCIL SMOOTH TEXTURE) 28 % packet   Oral   Take 1 packet by mouth at bedtime.         . simvastatin (ZOCOR) 40 MG tablet   Oral   Take 40 mg by mouth at bedtime.          Marland Kitchen VITAMIN B1-B12 IM   Intramuscular   Inject 1 mg into the muscle every 30 (thirty) days.          Triage Vitals: BP 107/51  Pulse 100  Temp(Src) 98 F (36.7 C) (Oral)  Resp 16  Ht 5\' 2"  (1.575 m)  Wt 130 lb (58.968 kg)  BMI 23.77 kg/m2  SpO2 98%  Physical Exam  Nursing note and vitals  reviewed. Constitutional: She is oriented to person, place, and time. She appears well-developed and well-nourished. No distress.  HENT:  Head: Normocephalic and atraumatic.  Eyes: EOM are normal.  Neck: Neck supple. No tracheal deviation present.  Cardiovascular: Normal rate.   Pulmonary/Chest: Effort normal. No respiratory distress.  Musculoskeletal: Normal range of motion.  Tenderness to right collar bone. Slight deformity.   Neurological: She is alert and oriented to person, place, and time.  Skin: Skin is warm and dry.  Bruising in the right chest. No chest wall tenderness. Deformity in her right clavicle. No skin tinting. Right shoulder ROM limited secondary to pain.  Psychiatric: She has a normal mood and affect. Her behavior is normal.    ED Course  Procedures (including critical care time) DIAGNOSTIC STUDIES: Oxygen Saturation is 98% on room air, normal by my interpretation.    COORDINATION OF CARE: 1:42 PM-Discussed fractured right collar bone. Will order sling for right arm, diagnostic labs, and UA. Advised pt to continue taking motrin and tylenol. Discussed treatment plan with pt at bedside and pt agreed to plan.   Medications  cefTRIAXone (ROCEPHIN) 1 g in dextrose 5 % 50 mL IVPB (1 g Intravenous New Bag/Given 08/09/13 1524)    Labs Review Labs Reviewed  URINALYSIS, ROUTINE W REFLEX MICROSCOPIC - Abnormal; Notable for the following:    Hgb urine dipstick SMALL (*)    Nitrite POSITIVE (*)    Leukocytes, UA SMALL (*)    All other components within normal limits  BASIC METABOLIC PANEL - Abnormal; Notable for the following:    Potassium 3.4 (*)    Glucose, Bld 124 (*)    GFR calc non Af Amer 56 (*)    GFR calc Af Amer 64 (*)    All other components within normal limits  URINE MICROSCOPIC-ADD ON - Abnormal; Notable for the following:    Squamous Epithelial / LPF FEW (*)    Bacteria, UA MANY (*)    Crystals CA OXALATE CRYSTALS (*)    All other components within  normal limits  URINE CULTURE  URINE CULTURE  CBC   Imaging Review Dg Chest 2 View  08/09/2013   *RADIOLOGY REPORT*  Clinical Data: Fall, bruising upper chest/right shoulder  CHEST - 2 VIEW  Comparison: None.  Findings: Lungs are clear. No pleural effusion or pneumothorax.  The heart is normal in size.  Surgical clips overlying the right lower chest wall/breast.  Mildly displaced/angulated mid right clavicle fracture.  IMPRESSION: Mildly displaced/angulated mid right clavicle fracture.  No pneumothorax.   Original Report Authenticated By: Charline Bills, M.D.   Dg Shoulder Right  08/09/2013   *RADIOLOGY REPORT*  Clinical Data: Right shoulder pain post fall 2 nights ago  RIGHT SHOULDER - 2+ VIEW  Comparison: 11/6/ 12  Findings: Three views of the right shoulder submitted.  There is diffuse osteopenia.  There is angulated fracture of the mid clavicle shaft.  Glenohumeral joint is preserved.  Mild degenerative changes AC joint.  Mild spurring of the humeral head.  IMPRESSION: There is angulated fracture of the mid clavicle shaft. Glenohumeral joint is preserved.  Mild degenerative changes AC joint.  Mild spurring of the humeral head.   Original Report Authenticated By: Natasha Mead, M.D.    MDM   1. Right clavicle fracture, closed, initial encounter   2. UTI (urinary  tract infection)    79 year old female fell between her bed and the wall 2 days ago. She tried to get out of bed and got wedged between the bed the wall patient had before. She was conscious her head. She's noted right chest bruising and right shoulder pain since the event. She states that day she felt rotten described as a CT with some nausea. She denies any chest pain, shortness of breath, diarrhea, abdominal pain, urinary symptoms. She states she felt rotten again this morning, however cannot give me good descriptors of what this rotten feeling means. Here she had stable vitals. On exam she has right shoulder bruising from the neck down to  the upper chest. She has a deformity in her mid clavicle on her x-ray it shows a clavicular deformity. We will sling this broken clavicle. Also check some basic labs including CBC, BMP, urine, EKG P[place] feeling. She appears well and is not complaining of fever any pains or other poor feelings at this time. Labs show UTI. Rocephin given. Patient wants to go home, well appearing. Son is staying with her to help with mobility and antibiotics. Rx for Cipro given. Stable for discharge.    I personally performed the services described in this documentation, which was scribed in my presence. The recorded information has been reviewed and is accurate.     Dagmar Hait, MD 08/09/13 1945

## 2013-08-09 NOTE — ED Notes (Signed)
Pt rolled out of bed two nights ago, getting caught between the bed and dresser, pt c/o pain and bruising to right shoulder and chest area. Cms intact distal

## 2013-08-11 LAB — URINE CULTURE

## 2013-08-24 ENCOUNTER — Encounter (HOSPITAL_COMMUNITY): Payer: Self-pay | Admitting: Emergency Medicine

## 2013-08-24 ENCOUNTER — Inpatient Hospital Stay (HOSPITAL_COMMUNITY)
Admission: EM | Admit: 2013-08-24 | Discharge: 2013-08-29 | DRG: 071 | Disposition: A | Discharge status: Home Health | Payer: Medicare Other | Attending: Internal Medicine | Admitting: Internal Medicine

## 2013-08-24 DIAGNOSIS — Z23 Encounter for immunization: Secondary | ICD-10-CM

## 2013-08-24 DIAGNOSIS — F0390 Unspecified dementia without behavioral disturbance: Secondary | ICD-10-CM

## 2013-08-24 DIAGNOSIS — N390 Urinary tract infection, site not specified: Secondary | ICD-10-CM

## 2013-08-24 DIAGNOSIS — E785 Hyperlipidemia, unspecified: Secondary | ICD-10-CM | POA: Diagnosis present

## 2013-08-24 DIAGNOSIS — I129 Hypertensive chronic kidney disease with stage 1 through stage 4 chronic kidney disease, or unspecified chronic kidney disease: Secondary | ICD-10-CM | POA: Diagnosis present

## 2013-08-24 DIAGNOSIS — Z8673 Personal history of transient ischemic attack (TIA), and cerebral infarction without residual deficits: Secondary | ICD-10-CM

## 2013-08-24 DIAGNOSIS — Z853 Personal history of malignant neoplasm of breast: Secondary | ICD-10-CM

## 2013-08-24 DIAGNOSIS — H9190 Unspecified hearing loss, unspecified ear: Secondary | ICD-10-CM | POA: Diagnosis present

## 2013-08-24 DIAGNOSIS — F411 Generalized anxiety disorder: Secondary | ICD-10-CM | POA: Diagnosis present

## 2013-08-24 DIAGNOSIS — Z87440 Personal history of urinary (tract) infections: Secondary | ICD-10-CM

## 2013-08-24 DIAGNOSIS — K219 Gastro-esophageal reflux disease without esophagitis: Secondary | ICD-10-CM | POA: Diagnosis present

## 2013-08-24 DIAGNOSIS — Z66 Do not resuscitate: Secondary | ICD-10-CM | POA: Diagnosis present

## 2013-08-24 DIAGNOSIS — F329 Major depressive disorder, single episode, unspecified: Secondary | ICD-10-CM | POA: Diagnosis present

## 2013-08-24 DIAGNOSIS — Z87891 Personal history of nicotine dependence: Secondary | ICD-10-CM

## 2013-08-24 DIAGNOSIS — E2749 Other adrenocortical insufficiency: Secondary | ICD-10-CM

## 2013-08-24 DIAGNOSIS — N183 Chronic kidney disease, stage 3 unspecified: Secondary | ICD-10-CM

## 2013-08-24 DIAGNOSIS — E876 Hypokalemia: Secondary | ICD-10-CM | POA: Diagnosis present

## 2013-08-24 DIAGNOSIS — G9341 Metabolic encephalopathy: Principal | ICD-10-CM | POA: Diagnosis present

## 2013-08-24 DIAGNOSIS — M1990 Unspecified osteoarthritis, unspecified site: Secondary | ICD-10-CM | POA: Diagnosis present

## 2013-08-24 DIAGNOSIS — T502X5A Adverse effect of carbonic-anhydrase inhibitors, benzothiadiazides and other diuretics, initial encounter: Secondary | ICD-10-CM | POA: Diagnosis present

## 2013-08-24 DIAGNOSIS — E860 Dehydration: Secondary | ICD-10-CM

## 2013-08-24 DIAGNOSIS — E2740 Unspecified adrenocortical insufficiency: Secondary | ICD-10-CM

## 2013-08-24 DIAGNOSIS — I10 Essential (primary) hypertension: Secondary | ICD-10-CM

## 2013-08-24 DIAGNOSIS — N179 Acute kidney failure, unspecified: Secondary | ICD-10-CM

## 2013-08-24 DIAGNOSIS — Z79899 Other long term (current) drug therapy: Secondary | ICD-10-CM

## 2013-08-24 DIAGNOSIS — Z923 Personal history of irradiation: Secondary | ICD-10-CM

## 2013-08-24 DIAGNOSIS — F3289 Other specified depressive episodes: Secondary | ICD-10-CM | POA: Diagnosis present

## 2013-08-24 DIAGNOSIS — R413 Other amnesia: Secondary | ICD-10-CM | POA: Diagnosis present

## 2013-08-24 DIAGNOSIS — K5900 Constipation, unspecified: Secondary | ICD-10-CM | POA: Diagnosis present

## 2013-08-24 DIAGNOSIS — R197 Diarrhea, unspecified: Secondary | ICD-10-CM | POA: Diagnosis present

## 2013-08-24 HISTORY — DX: Unspecified dementia, unspecified severity, without behavioral disturbance, psychotic disturbance, mood disturbance, and anxiety: F03.90

## 2013-08-24 HISTORY — DX: Fracture of unspecified part of unspecified clavicle, initial encounter for closed fracture: S42.009A

## 2013-08-24 LAB — BASIC METABOLIC PANEL
BUN: 20 mg/dL (ref 6–23)
Calcium: 10.3 mg/dL (ref 8.4–10.5)
GFR calc Af Amer: 42 mL/min — ABNORMAL LOW (ref >90)
GFR calc non Af Amer: 36 mL/min — ABNORMAL LOW (ref >90)
Potassium: 3.3 mEq/L — ABNORMAL LOW (ref 3.5–5.1)

## 2013-08-24 LAB — CBC WITH DIFFERENTIAL/PLATELET
Basophils Absolute: 0.1 10*3/uL (ref 0.0–0.1)
Basophils Relative: 1 % (ref 0–1)
Eosinophils Absolute: 0.1 10*3/uL (ref 0.0–0.7)
Hemoglobin: 12.8 g/dL (ref 12.0–15.0)
MCHC: 33.6 g/dL (ref 30.0–36.0)
Monocytes Relative: 11 % (ref 3–12)
Neutro Abs: 4 10*3/uL (ref 1.7–7.7)
Neutrophils Relative %: 70 % (ref 43–77)
Platelets: 403 10*3/uL — ABNORMAL HIGH (ref 150–400)
RDW: 13.3 % (ref 11.5–15.5)

## 2013-08-24 LAB — URINALYSIS, ROUTINE W REFLEX MICROSCOPIC
Bilirubin Urine: NEGATIVE
Nitrite: NEGATIVE
Protein, ur: NEGATIVE mg/dL
Specific Gravity, Urine: >1.03 — ABNORMAL HIGH (ref 1.005–1.030)
Urobilinogen, UA: 0.2 mg/dL (ref 0.0–1.0)

## 2013-08-24 LAB — URINE MICROSCOPIC-ADD ON

## 2013-08-24 MED ORDER — LUBIPROSTONE 24 MCG PO CAPS
24.0000 ug | ORAL_CAPSULE | Freq: Every day | ORAL | Status: DC
  Administered 2013-08-25 – 2013-08-29 (×5): 24 ug via ORAL
  Filled 2013-08-24 (×7): qty 1

## 2013-08-24 MED ORDER — PSYLLIUM 95 % PO PACK
1.0000 | PACK | Freq: Every day | ORAL | Status: DC | PRN
  Administered 2013-08-29: 1 via ORAL
  Filled 2013-08-24: qty 1

## 2013-08-24 MED ORDER — ACETAMINOPHEN 325 MG PO TABS
650.0000 mg | ORAL_TABLET | Freq: Four times a day (QID) | ORAL | Status: DC | PRN
  Administered 2013-08-24 – 2013-08-29 (×9): 650 mg via ORAL
  Filled 2013-08-24 (×9): qty 2

## 2013-08-24 MED ORDER — INFLUENZA VAC SPLIT QUAD 0.5 ML IM SUSP
0.5000 mL | INTRAMUSCULAR | Status: AC
  Administered 2013-08-25: 0.5 mL via INTRAMUSCULAR
  Filled 2013-08-24: qty 0.5

## 2013-08-24 MED ORDER — HALOPERIDOL LACTATE 5 MG/ML IJ SOLN
0.5000 mg | Freq: Four times a day (QID) | INTRAMUSCULAR | Status: DC | PRN
  Administered 2013-08-25: 1 mg via INTRAVENOUS
  Filled 2013-08-24: qty 1

## 2013-08-24 MED ORDER — BENEFIBER PO PACK: 1.0000 | PACK | Freq: Every day | ORAL | Status: DC | PRN

## 2013-08-24 MED ORDER — DEXTROSE 5 % IV SOLN
1.0000 g | Freq: Once | INTRAVENOUS | Status: AC
  Administered 2013-08-24: 1 g via INTRAVENOUS

## 2013-08-24 MED ORDER — DEXTROSE 5 % IV SOLN
INTRAVENOUS | Status: AC
  Filled 2013-08-24: qty 10

## 2013-08-24 MED ORDER — LOTEPREDNOL ETABONATE 0.5 % OP SUSP
1.0000 [drp] | Freq: Four times a day (QID) | OPHTHALMIC | Status: DC
  Administered 2013-08-24 – 2013-08-29 (×16): 1 [drp] via OPHTHALMIC
  Filled 2013-08-24: qty 5

## 2013-08-24 MED ORDER — ADULT MULTIVITAMIN W/MINERALS CH
1.0000 | ORAL_TABLET | Freq: Every day | ORAL | Status: DC
  Administered 2013-08-24 – 2013-08-29 (×6): 1 via ORAL
  Filled 2013-08-24 (×6): qty 1

## 2013-08-24 MED ORDER — DULOXETINE HCL 60 MG PO CPEP
120.0000 mg | ORAL_CAPSULE | Freq: Every day | ORAL | Status: DC
  Administered 2013-08-24 – 2013-08-29 (×6): 120 mg via ORAL
  Filled 2013-08-24 (×6): qty 2

## 2013-08-24 MED ORDER — DOCUSATE SODIUM 100 MG PO CAPS: 100.0000 mg | ORAL_CAPSULE | Freq: Every evening | ORAL | Status: DC | PRN

## 2013-08-24 MED ORDER — SODIUM CHLORIDE 0.9 % IV BOLUS (SEPSIS)
500.0000 mL | Freq: Once | INTRAVENOUS | Status: AC
  Administered 2013-08-24: 500 mL via INTRAVENOUS

## 2013-08-24 MED ORDER — HYDROCORTISONE 10 MG PO TABS
10.0000 mg | ORAL_TABLET | Freq: Every day | ORAL | Status: DC
  Administered 2013-08-24 – 2013-08-29 (×6): 10 mg via ORAL
  Filled 2013-08-24 (×8): qty 1

## 2013-08-24 MED ORDER — ENSURE COMPLETE PO LIQD
237.0000 mL | Freq: Two times a day (BID) | ORAL | Status: DC
  Administered 2013-08-25 – 2013-08-28 (×7): 237 mL via ORAL

## 2013-08-24 MED ORDER — SODIUM CHLORIDE 0.9 % IV SOLN: INTRAVENOUS | Status: DC

## 2013-08-24 MED ORDER — POTASSIUM CHLORIDE CRYS ER 20 MEQ PO TBCR
20.0000 meq | EXTENDED_RELEASE_TABLET | Freq: Once | ORAL | Status: AC
  Administered 2013-08-24: 20 meq via ORAL
  Filled 2013-08-24: qty 1

## 2013-08-24 MED ORDER — DEXTROSE 5 % IV SOLN
1.0000 g | INTRAVENOUS | Status: DC
  Administered 2013-08-25: 1 g via INTRAVENOUS
  Filled 2013-08-24 (×2): qty 10

## 2013-08-24 MED ORDER — SIMVASTATIN 20 MG PO TABS
40.0000 mg | ORAL_TABLET | Freq: Every day | ORAL | Status: DC
  Administered 2013-08-24 – 2013-08-26 (×3): 40 mg via ORAL
  Filled 2013-08-24 (×4): qty 2

## 2013-08-24 NOTE — ED Provider Notes (Signed)
CSN: [id number]     Arrival date & time 08/24/13  1039 History  This chart was scribed for Donnetta Hutching, MD by Quintella Reichert, ED scribe.  This patient was seen in room APA18/APA18 and the patient's care was started at 11:56 AM.  Chief Complaint  Patient presents with  . Altered Mental Status    The history is provided by the patient and a relative. No language interpreter was used.    Level 5 Caveat: AMS  HPI Comments: Bonnie [name] is a 79 y.o. female who presents to the Emergency Department complaining of AMS that has been present for 6-8 months but has been worsening over the past several weeks.  Pt was seen in the ED 15 days ago for a fall, fractured right clavicle and UTI.  She was given a sling and placed on antibiotics at that time.  Sons report that she has been taking her antibiotics as instructed but she has been growing increasingly disoriented.  Last night she complained that she had a "bunch of men parading" in her home when her son was the only other person there with her.  Son reports that she has also occasionally not able to recognize him and has been confused about what the date is or where she lives.  Sons state they do not feel pt is capable of living alone and they are in the process of searching for a nursing facility.    Past Medical History  Diagnosis Date  . GERD (gastroesophageal reflux disease)   . Colitis, ischemic   . Osteoarthritis   . Breast cancer     Right Breast - May 2013/ Left Breast - 2003  . Anxiety   . Stress incontinence, female   . Constipation - functional   . Hypertension   . Headache(784.0)   . Hx: UTI (urinary tract infection)   . HOH (hard of hearing)     both ears; refuses to wear hearing aides  . Memory difficulties   . Benign essential tremor     left hand; intermittent  . Hyperlipidemia   . Adrenal gland hypofunction     takes cortisone daily  . S/P radiation therapy 04/15/12 -05/13/12    RIGHT BREAST  . Chronic constipation  01/04/13  . Fatigue 02/25/13    chronic  . Collar bone fracture     Past Surgical History  Procedure Laterality Date  . Tonsillectomy      At age 31  . Lumbar laminectomy    . Abdominal hysterectomy    . Bunionectomy      bilateral  . Rotor cuff      Right Shoulder  . Upper gastrointestinal endoscopy    . Kidney stone    . Knee surgery  1987    L  . Foot surgery      left foot 1994, right foot 1995  . Lumpectomy  2003  . Eye surgery  2005  . Back surgery    . Breast surgery  2003    left breast  . Tonsillectomy    . Breast surgery  2013    Family History  Problem Relation Age of Onset  . Heart disease Brother   . Alcohol abuse Brother   . Alcohol abuse Brother   . Healthy Son   . Anesthesia problems Neg Hx     History  Substance Use Topics  . Smoking status: Former Smoker -- 0.50 packs/day for 20 years    Types: Cigarettes  Quit date: 09/21/1996  . Smokeless tobacco: Never Used     Comment: smoked to 3-4 cigartettes a day  . Alcohol Use: Yes     Comment: Occasional    OB History   Grav Para Term Preterm Abortions TAB SAB Ect Mult Living   2 2 2       2       Review of Systems  Unable to perform ROS: Mental status change      Allergies  Macrodantin  Home Medications   Current Outpatient Rx  Name  Route  Sig  Dispense  Refill  . ALPRAZolam (XANAX) 0.5 MG tablet   Oral   Take 0.5 mg by mouth 4 (four) times daily as needed. anxiety         . ciprofloxacin (CIPRO) 500 MG tablet   Oral   Take 1 tablet (500 mg total) by mouth 2 (two) times daily.   14 tablet   0   . cyanocobalamin (,VITAMIN B-12,) 1000 MCG/ML injection   Intramuscular   Inject 1,000 mcg into the muscle once.         . docusate sodium (COLACE) 100 MG capsule   Oral   Take 2 capsules (200 mg total) by mouth at bedtime.      0   . DULoxetine (CYMBALTA) 60 MG capsule   Oral   Take 120 mg by mouth daily.          . hydrochlorothiazide (HYDRODIURIL) 25 MG tablet    Oral   Take 25 mg by mouth daily.         Marland Kitchen HYDROcodone-acetaminophen (NORCO/VICODIN) 5-325 MG per tablet   Oral   Take 1 tablet by mouth every 8 (eight) hours as needed for pain.   10 tablet   0   . hydrocortisone (CORTEF) 20 MG tablet   Oral   Take 10 mg by mouth daily.          Marland Kitchen loteprednol (LOTEMAX) 0.5 % ophthalmic suspension   Both Eyes   Place 1 drop into both eyes 2 (two) times daily.          Marland Kitchen lubiprostone (AMITIZA) 24 MCG capsule   Oral   Take 24 mcg by mouth daily with breakfast.          . Multiple Vitamins-Minerals (CENTRUM SILVER ADULT 50+ PO)   Oral   Take 1 tablet by mouth daily.          . psyllium (METAMUCIL SMOOTH TEXTURE) 28 % packet   Oral   Take 1 packet by mouth at bedtime.         . simvastatin (ZOCOR) 40 MG tablet   Oral   Take 40 mg by mouth at bedtime.           BP 116/63  Pulse 88  Temp(Src) 98.3 F (36.8 C) (Oral)  Resp 17  Ht 5\' 2"  (1.575 m)  Wt 130 lb (58.968 kg)  BMI 23.77 kg/m2  SpO2 99%  Physical Exam  Nursing note and vitals reviewed. Constitutional: She appears well-developed and well-nourished.  HENT:  Head: Normocephalic and atraumatic.  Eyes: Conjunctivae and EOM are normal. Pupils are equal, round, and reactive to light.  Neck: Normal range of motion. Neck supple.  Cardiovascular: Normal rate, regular rhythm and normal heart sounds.   Pulmonary/Chest: Effort normal and breath sounds normal.  Abdominal: Soft. Bowel sounds are normal.  Musculoskeletal: Normal range of motion.  Able to move all extremities  Neurological: She is  alert.  Oriented to place but not to time  Skin: Skin is warm and dry.  Psychiatric: She has a normal mood and affect.    ED Course  Procedures (including critical care time)  DIAGNOSTIC STUDIES: Oxygen Saturation is 99% on room air, normal by my interpretation.    COORDINATION OF CARE: 12:05 PM-Discussed treatment plan which includes bloodwork and UA with pt and family at  bedside and they agreed to plan.    Results for orders placed during the hospital encounter of 08/24/13  URINALYSIS, ROUTINE W REFLEX MICROSCOPIC      Result Value Range   Color, Urine YELLOW  YELLOW   APPearance CLEAR  CLEAR   Specific Gravity, Urine >1.030 (*) 1.005 - 1.030   pH 6.0  5.0 - 8.0   Glucose, UA NEGATIVE  NEGATIVE mg/dL   Hgb urine dipstick TRACE (*) NEGATIVE   Bilirubin Urine NEGATIVE  NEGATIVE   Ketones, ur NEGATIVE  NEGATIVE mg/dL   Protein, ur NEGATIVE  NEGATIVE mg/dL   Urobilinogen, UA 0.2  0.0 - 1.0 mg/dL   Nitrite NEGATIVE  NEGATIVE   Leukocytes, UA TRACE (*) NEGATIVE  BASIC METABOLIC PANEL      Result Value Range   Sodium 136  135 - 145 mEq/L   Potassium 3.3 (*) 3.5 - 5.1 mEq/L   Chloride 97  96 - 112 mEq/L   CO2 31  19 - 32 mEq/L   Glucose, Bld 101 (*) 70 - 99 mg/dL   BUN 20  6 - 23 mg/dL   Creatinine, Ser 1.61 (*) 0.50 - 1.10 mg/dL   Calcium 09.6  8.4 - 04.5 mg/dL   GFR calc non Af Amer 36 (*) >90 mL/min   GFR calc Af Amer 42 (*) >90 mL/min  CBC WITH DIFFERENTIAL      Result Value Range   WBC 5.7  4.0 - 10.5 K/uL   RBC 4.22  3.87 - 5.11 MIL/uL   Hemoglobin 12.8  12.0 - 15.0 g/dL   HCT 40.9  81.1 - 91.4 %   MCV 90.3  78.0 - 100.0 fL   MCH 30.3  26.0 - 34.0 pg   MCHC 33.6  30.0 - 36.0 g/dL   RDW 78.2  95.6 - 21.3 %   Platelets 403 (*) 150 - 400 K/uL   Neutrophils Relative % 70  43 - 77 %   Neutro Abs 4.0  1.7 - 7.7 K/uL   Lymphocytes Relative 16  12 - 46 %   Lymphs Abs 0.9  0.7 - 4.0 K/uL   Monocytes Relative 11  3 - 12 %   Monocytes Absolute 0.6  0.1 - 1.0 K/uL   Eosinophils Relative 2  0 - 5 %   Eosinophils Absolute 0.1  0.0 - 0.7 K/uL   Basophils Relative 1  0 - 1 %   Basophils Absolute 0.1  0.0 - 0.1 K/uL  URINE MICROSCOPIC-ADD ON      Result Value Range   Squamous Epithelial / LPF RARE  RARE   WBC, UA 11-20  <3 WBC/hpf   RBC / HPF 3-6  <3 RBC/hpf   Bacteria, UA RARE  RARE   Crystals CA OXALATE CRYSTALS (*) NEGATIVE     MDM  No  diagnosis found. History and physical consistent with urinary tract infection. I suspect patient also has early Alzheimer's disease. Admit to general medical    I personally performed the services described in this documentation, which was scribed in my presence. The  recorded information has been reviewed and is accurate.    Donnetta Hutching, MD 08/24/13 1504

## 2013-08-24 NOTE — H&P (Signed)
[facility] History and Physical  Bonnie [name] ZOX:[id number] DOB: [birth year] DOA: 08/24/2013  Referring physician:  Donnetta Hutching PCP:  Cassell Smiles., MD   Chief Complaint:  Confusion, AMS  HPI:  The patient is a 79 y.o. year-old female with history of HTN, HLD, dementia, chronic constipation and diarrhea, adrenal insufficiency, acid reflux, breast cancer currently in remission and last radiation treatment approximately 6 months ago, anxiety and depression who presents with altered mental status.  The patient has had problems with memory and decreasing function for over a year. She has had a further decline since her last treatment for breast cancer approximately 6 months ago. Over the last 3 weeks, she has had multiple urinary tract infections which required antibiotics initially with ciprofloxacin and afterwords with Bactrim. She has had a fall and that time and has had increasing problems with confusion, hallucinations, urinary and bowel incontinence. Her family was concerned that she was having hallucinations of her mother and has been asking repeatedly where her mother is over the last day, and they are concerned that she may have another urinary tract infection. She has still been taking Bactrim and her last urine culture from 3 weeks ago was pansensitive Escherichia coli. She has not had fevers, chills, vomiting, shortness of breath, cough. She has been having her baseline stool textures.  Review of Systems:  Unable to obtain due to patient's underlying dementia and altered mental status  Past Medical History  Diagnosis Date  . GERD (gastroesophageal reflux disease)   . Colitis, ischemic   . Osteoarthritis   . Breast cancer     Right Breast - May 2013/ Left Breast - 2003  . Anxiety   . Stress incontinence, female   . Constipation - functional   . Hypertension   . Headache(784.0)   . Hx: UTI (urinary tract infection)   . HOH (hard of hearing)     both ears; refuses to  wear hearing aides  . Dementia   . Benign essential tremor     left hand; intermittent  . Hyperlipidemia   . Adrenal gland hypofunction     takes cortisone daily  . S/P radiation therapy 04/15/12 -05/13/12    RIGHT BREAST  . Chronic constipation 01/04/13  . Fatigue 02/25/13    chronic  . Collar bone fracture    Past Surgical History  Procedure Laterality Date  . Tonsillectomy      At age 59  . Lumbar laminectomy    . Abdominal hysterectomy    . Bunionectomy      bilateral  . Rotor cuff      Right Shoulder  . Upper gastrointestinal endoscopy    . Kidney stone    . Knee surgery  1987    L  . Foot surgery      left foot 1994, right foot 1995  . Lumpectomy  2003  . Eye surgery  2005  . Back surgery    . Breast surgery  2003    left breast  . Tonsillectomy    . Breast surgery  2013   Social History:  reports that she quit smoking about 16 years ago. Her smoking use included Cigarettes. She has a 10 pack-year smoking history. She has never used smokeless tobacco. She reports that  drinks alcohol. She reports that she does not use illicit drugs. Lives at home with her two sons who rotate care of her 3 days each.    Allergies  Allergen Reactions  . Macrodantin Nausea  Only    It also causes fever    Family History  Problem Relation Age of Onset  . Heart disease Brother   . Alcohol abuse Brother   . Alcohol abuse Brother   . Healthy Son   . Anesthesia problems Neg Hx   . Heart disease Mother   . Heart disease Father   . Dementia Mother      Prior to Admission medications   Medication Sig Start Date End Date Taking? Authorizing Provider  ALPRAZolam Prudy Feeler) 0.5 MG tablet Take 0.5 mg by mouth 4 (four) times daily as needed. anxiety   Yes Historical Provider, MD  cyanocobalamin (,VITAMIN B-12,) 1000 MCG/ML injection Inject 1,000 mcg into the muscle every 30 (thirty) days.    Yes Historical Provider, MD  docusate sodium (COLACE) 100 MG capsule Take 100 mg by mouth at  bedtime as needed for constipation.   Yes Historical Provider, MD  DULoxetine (CYMBALTA) 60 MG capsule Take 120 mg by mouth daily.    Yes Historical Provider, MD  hydrochlorothiazide (HYDRODIURIL) 25 MG tablet Take 12.5 mg by mouth daily.    Yes Historical Provider, MD  HYDROcodone-acetaminophen (NORCO) 10-325 MG per tablet Take 1 tablet by mouth every 6 (six) hours as needed for pain.   Yes Historical Provider, MD  HYDROcodone-acetaminophen (NORCO/VICODIN) 5-325 MG per tablet Take 1 tablet by mouth every 8 (eight) hours as needed for pain. 08/09/13  Yes Dagmar Hait, MD  hydrocortisone (CORTEF) 20 MG tablet Take 10 mg by mouth daily.    Yes Historical Provider, MD  loteprednol (LOTEMAX) 0.5 % ophthalmic suspension Place 1 drop into both eyes 4 (four) times daily.    Yes Historical Provider, MD  lubiprostone (AMITIZA) 24 MCG capsule Take 24 mcg by mouth daily with breakfast.    Yes Historical Provider, MD  Multiple Vitamins-Minerals (CENTRUM SILVER ADULT 50+ PO) Take 1 tablet by mouth daily.    Yes Historical Provider, MD  simvastatin (ZOCOR) 40 MG tablet Take 40 mg by mouth at bedtime.    Yes Historical Provider, MD  sulfamethoxazole-trimethoprim (BACTRIM DS) 800-160 MG per tablet Take 1 tablet by mouth 2 (two) times daily.   Yes Historical Provider, MD  ciprofloxacin (CIPRO) 500 MG tablet Take 1 tablet (500 mg total) by mouth 2 (two) times daily. 08/09/13   Dagmar Hait, MD  Wheat Dextrin (BENEFIBER PO) Take by mouth daily as needed (constipation).    Historical Provider, MD   Physical Exam: Filed Vitals:   08/24/13 1057  BP: 116/63  Pulse: 88  Temp: 98.3 F (36.8 C)  TempSrc: Oral  Resp: 17  Height: 5\' 2"  (1.575 m)  Weight: 58.968 kg (130 lb)  SpO2: 99%     General:  Caucasian female, no acute distress  Eyes:  PERRL, anicteric, non-injected, left pupil marginally larger than the right, and reflections suggestive type cataract replacements  ENT:  Nares clear.  OP clear,  non-erythematous without plaques or exudates.  MMM.  Neck:  Supple without TM or JVD.    Lymph:  No cervical, supraclavicular, or submandibular LAD.  Cardiovascular:  RRR, normal S1, S2, without m/r/g.  2+ pulses, warm extremities  Respiratory:  CTA bilaterally without increased WOB.  Abdomen:  NABS.  Soft, ND/NT.    Skin:  No rashes or focal lesions.  Musculoskeletal:  Normal bulk and tone.  1+ LE edema.  Psychiatric:  A & O to person only.  Appropriate affect.  Neurologic:  CN 3-12 intact.  5/5 strength.  Sensation intact.  Labs on Admission:  Basic Metabolic Panel:  Recent Labs Lab 08/24/13 1224  NA 136  K 3.3*  CL 97  CO2 31  GLUCOSE 101*  BUN 20  CREATININE 1.32*  CALCIUM 10.3   Liver Function Tests: No results found for this basename: AST, ALT, ALKPHOS, BILITOT, PROT, ALBUMIN,  in the last 168 hours No results found for this basename: LIPASE, AMYLASE,  in the last 168 hours No results found for this basename: AMMONIA,  in the last 168 hours CBC:  Recent Labs Lab 08/24/13 1224  WBC 5.7  NEUTROABS 4.0  HGB 12.8  HCT 38.1  MCV 90.3  PLT 403*   Cardiac Enzymes: No results found for this basename: CKTOTAL, CKMB, CKMBINDEX, TROPONINI,  in the last 168 hours  BNP (last 3 results) No results found for this basename: PROBNP,  in the last 8760 hours CBG: No results found for this basename: GLUCAP,  in the last 168 hours  Radiological Exams on Admission: No results found.  EKG: pending  Assessment/Plan Principal Problem:   Metabolic encephalopathy Active Problems:   Diarrhea   Memory disorder   Hypoadrenalism   Hypertension   Hyperlipidemia   Constipation   UTI (lower urinary tract infection)   Dementia   Dehydration   Acute on chronic kidney disease, stage 3   Hypokalemia  ---  Metabolic encephalopathy, likely due to urinary tract infection in setting of underlying dementia.  May also have some medication side effect from benzodiazepines  and some dehydration.   -  EKG -  Ammonia level -  Continue antibiotics for urinary tract infection -  Gentle hydration -  Consider MRI brain to eval for mets  Dementia, worsening -  TSH, B12, RPR -  Discontinue sedating medications such as narcotics and benzodiazepines -  Falls precautions -  Frequent reorientation -  Haldol when necessary -  Continue statin, however these can worsen dementia symptoms  UTI suggested by 11-20 white blood cells per high power field, however this may represent clearing infection -  Continue ceftriaxone -  Followup on urine culture  Acute on chronic renal insufficiency,Likely prerenal  -  Minimize nephrotoxins -  Renally dosed medications -  Gentle hydration   Dehydration suggested by mild AKI and elevated specific gravity and UA -  Discontinue HCTZ -  Gentle hydration  Hypertension/hyperlipidemia, blood pressure low normal.  Trend BP -   Hold HCTZ -   Continue simvastatin  GERD, stable.  Adrenal insufficiency, continue steroids.    Chronic diarrhea and constipation.   -  Continue home medications  Depression and anxiety, stable. Continue Cymbalta.  Minimize benzodiazepines.  History of breast cancer, currently in remission.  Hypokalemia, likely secondary to diuretic use -  Replete with oral potassium -  Hold diuretic  Patient apparently goes several days without eating or drinking intermittently at home. At risk for moderate to severe protein calorie malnutrition -  Regular diet with supplements  Diet:  Regular  Access:  PIV  IVF:  Normal saline at 75 mL per hour x 12 hours Proph:  Lovenox  Code Status: DO NOT RESUSCITATE  Family Communication: Spoke with the patient and 2 sons  Disposition Plan: Admit to medical surgical.  PT/OT consults and SW consult for SNF placement  Time spent: 60 min Renae Fickle [facility] Pager [phone]  If 7PM-7AM, please contact night-coverage [email or web address] Password  [facility] 08/24/2013, 3:38 PM

## 2013-08-24 NOTE — ED Notes (Signed)
Pt brought to ED for altered mental status - per sons, pt has been confused/disoriented x 6 months or longer, that has gotten worse over the past few weeks. Was seen and tx 3 weeks ago for fall, fractured clavicle, and UTI.  Pt is on second course of abx for UTI (Septra DS started on 08/18/13).  Alert, oriented to person and place; states the year is "19-something", and reports the month as March.  Pt also reports that she had a "bunch of men parading" in her home last night; per son, he was the only other person there with her.  Sons states that pt's confusion increases as the day goes on.

## 2013-08-24 NOTE — ED Notes (Signed)
Patient brought in by sons. Per sons patient was brought here on 08/09/13 after falling and hitting shoulder on edge of table. Per sons patient fractured collar bone. Sons state that she was confused when she fell the first time and diagnosed with UTI given Cipro, patient finished Cipro-urine rechecked at Dr Sharyon Medicus office given Septra DS to take. Sons reports confusion getting worse in where she did not recognize son or home.

## 2013-08-25 ENCOUNTER — Inpatient Hospital Stay (HOSPITAL_COMMUNITY): Payer: Medicare Other

## 2013-08-25 LAB — BASIC METABOLIC PANEL
BUN: 15 mg/dL (ref 6–23)
Chloride: 102 mEq/L (ref 96–112)
Creatinine, Ser: 1.01 mg/dL (ref 0.50–1.10)
GFR calc Af Amer: 58 mL/min — ABNORMAL LOW (ref >90)
Glucose, Bld: 125 mg/dL — ABNORMAL HIGH (ref 70–99)
Potassium: 3.5 mEq/L (ref 3.5–5.1)
Sodium: 138 mEq/L (ref 135–145)

## 2013-08-25 LAB — RPR: RPR Ser Ql: NONREACTIVE

## 2013-08-25 MED ORDER — TUBERCULIN PPD 5 UNIT/0.1ML ID SOLN
5.0000 [IU] | Freq: Once | INTRADERMAL | Status: AC
  Administered 2013-08-25: 5 [IU] via INTRADERMAL
  Filled 2013-08-25: qty 0.1

## 2013-08-25 MED ORDER — LORAZEPAM 2 MG/ML IJ SOLN
2.0000 mg | Freq: Once | INTRAMUSCULAR | Status: AC
  Administered 2013-08-25: 2 mg via INTRAVENOUS
  Filled 2013-08-25: qty 1

## 2013-08-25 MED ORDER — HEPARIN SODIUM (PORCINE) 5000 UNIT/ML IJ SOLN
5000.0000 [IU] | Freq: Three times a day (TID) | INTRAMUSCULAR | Status: DC
  Administered 2013-08-25 – 2013-08-29 (×10): 5000 [IU] via SUBCUTANEOUS
  Filled 2013-08-25 (×11): qty 1

## 2013-08-25 MED ORDER — LORAZEPAM 2 MG/ML IJ SOLN
2.0000 mg | INTRAMUSCULAR | Status: DC | PRN
  Administered 2013-08-25 – 2013-08-26 (×4): 2 mg via INTRAVENOUS
  Filled 2013-08-25 (×6): qty 1

## 2013-08-25 NOTE — Progress Notes (Signed)
[facility] PROGRESS NOTE  Bonnie [name]:[id number] DOB: Dec 01, 1927 DOA: 08/24/2013 PCP: Cassell Smiles., MD  Assessment/Plan  Metabolic encephalopathy, likely due to urinary tract infection in setting of underlying dementia. May also have some medication side effect from benzodiazepines and some dehydration.  EKG demonstrates RBBB with bifascicular block, similar to previous.  Prolonged QTc of > .  Ammonia level wnl.   - Continue antibiotics for urinary tract infection  - Gentle hydration  - I doubt patient would stay still long enough for MRI.  Will order CT head to see if there have been any recent strokes.    Dementia, worsening  - TSH, B12, RPR pending - Minimize sedating medications if possible - Falls precautions  - Frequent reorientation  - Avoid haldol, seroquel, etc due to prolonged QTc - will need to rely on benzodiazepines for sedation for now - Continue statin, however these can worsen dementia symptoms   UTI suggested by 11-20 white blood cells per high power field, however this may represent clearing infection  - Continue ceftriaxone  - Followup on urine culture   Acute on chronic renal insufficiency -  F/u AM BMP - Minimize nephrotoxins  - Renally dosed medications  -  Discontinue IVF for now  Dehydration suggested by mild AKI and elevated specific gravity and UA s/p IVF overnight.  Appears euvolemic. - Discontinue HCTZ  - d/c IVF  Hypertension/hyperlipidemia, blood pressure wnl. Trend BP  - Hold HCTZ  - Continue simvastatin   GERD, stable.   Adrenal insufficiency, continue steroids.   Chronic diarrhea and constipation.  - Continue home medications   Depression and anxiety, stable. Continue Cymbalta.  History of breast cancer, currently in remission.   Hypokalemia, likely secondary to diuretic use.   -  F/u AM BMP - Replete with oral potassium  - Hold diuretic   Patient apparently goes several days without eating or drinking  intermittently at home. At risk for moderate to severe protein calorie malnutrition  - Regular diet with supplements   Diet: Regular  Access: PIV  IVF: OFF Proph: Lovenox   Code Status: DO NOT RESUSCITATE  Family Communication: Spoke with the patient and 2 sons  Disposition Plan: Anticipate discharge to ALF dementia unit.     Consultants:  None  Procedures:  CT head pending  Antibiotics:  Ceftriaxone 9/24 >>   Was on cipro and then bactrim as outpatient prior to admission   HPI/Subjective:  Patient confused and agitated this morning.  Was very agitated overnight requiring administration of ativan to keep patient from hitting staff and family members.      Objective: Filed Vitals:   08/24/13 1057 08/24/13 1603 08/24/13 1654 08/24/13 2210  BP: 116/63 134/71 99/61 116/52  Pulse: 88 64 89 84  Temp: 98.3 F (36.8 C) 98.4 F (36.9 C) 98.1 F (36.7 C) 98.5 F (36.9 C)  TempSrc: Oral Oral Oral Oral  Resp: 17 18 18 20   Height: 5\' 2"  (1.575 m)  5\' 2"  (1.575 m)   Weight: 58.968 kg (130 lb)  58.6 kg (129 lb 3 oz)   SpO2: 99% 99% 100% 95%    Intake/Output Summary (Last 24 hours) at 08/25/13 1005 Last data filed at 08/25/13 0853  Gross per 24 hour  Intake    150 ml  Output      1 ml  Net    149 ml   Filed Weights   08/24/13 1057 08/24/13 1654  Weight: 58.968 kg (130 lb) 58.6 kg (129 lb  3 oz)    Exam:   General:  Caucasian female, No acute distress, awake and disoriented.    HEENT:  NCAT, MMM  Cardiovascular:  RRR, nl S1, S2 no mrg, 2+ pulses, warm extremities  Respiratory:  CTAB, no increased WOB  Abdomen:   NABS, soft, NT/ND  MSK:   Normal tone and bulk, trace LEE  Neuro:  Grossly moves all extremities  Psych:  Oriented to person only.    Data Reviewed: Basic Metabolic Panel:  Recent Labs Lab 08/24/13 1224  NA 136  K 3.3*  CL 97  CO2 31  GLUCOSE 101*  BUN 20  CREATININE 1.32*  CALCIUM 10.3   Liver Function Tests: No results found for  this basename: AST, ALT, ALKPHOS, BILITOT, PROT, ALBUMIN,  in the last 168 hours No results found for this basename: LIPASE, AMYLASE,  in the last 168 hours  Recent Labs Lab 08/25/13 0456  AMMONIA 26   CBC:  Recent Labs Lab 08/24/13 1224  WBC 5.7  NEUTROABS 4.0  HGB 12.8  HCT 38.1  MCV 90.3  PLT 403*   Cardiac Enzymes: No results found for this basename: CKTOTAL, CKMB, CKMBINDEX, TROPONINI,  in the last 168 hours BNP (last 3 results) No results found for this basename: PROBNP,  in the last 8760 hours CBG: No results found for this basename: GLUCAP,  in the last 168 hours  No results found for this or any previous visit (from the past 240 hour(s)).   Studies: No results found.  Scheduled Meds: . cefTRIAXone (ROCEPHIN)  IV  1 g Intravenous Q24H  . DULoxetine  120 mg Oral Daily  . feeding supplement  237 mL Oral BID BM  . heparin subcutaneous  5,000 Units Subcutaneous Q8H  . hydrocortisone  10 mg Oral Daily  . influenza vac split quadrivalent PF  0.5 mL Intramuscular Tomorrow-1000  . loteprednol  1 drop Both Eyes QID  . lubiprostone  24 mcg Oral Q breakfast  . multivitamin with minerals  1 tablet Oral Daily  . simvastatin  40 mg Oral QHS   Continuous Infusions: . sodium chloride 75 mL/hr at 08/24/13 1907    Principal Problem:   Metabolic encephalopathy Active Problems:   Diarrhea   Memory disorder   Hypoadrenalism   Hypertension   Hyperlipidemia   Constipation   UTI (lower urinary tract infection)   Dementia   Dehydration   Acute on chronic kidney disease, stage 3   Hypokalemia    Time spent: 30 min    Cleota Pellerito, [facility]  [facility] Pager [phone]. If 7PM-7AM, please contact night-coverage at [email or web address], password [facility] 08/25/2013, 10:05 AM  LOS: 1 day

## 2013-08-25 NOTE — Clinical Social Work Placement (Signed)
Clinical Social Work Department CLINICAL SOCIAL WORK PLACEMENT NOTE 08/25/2013  Patient:  Bonnie [name], Bonnie [name]  Account Number:  [id number] Admit date:  08/24/2013  Clinical Social Worker:  [name], LCSW  Date/time:  08/25/2013 10:40 AM  Clinical Social Work is seeking post-discharge placement for this patient at the following level of care:   ASSISTED LIVING/REST HOME   (*CSW will update this form in Epic as items are completed)   08/25/2013  Patient/family provided with [facility] Department of Clinical Social Work's list of facilities offering this level of care within the geographic area requested by the patient (or if unable, by the patient's family).  08/25/2013  Patient/family informed of their freedom to choose among providers that offer the needed level of care, that participate in Medicare, Medicaid or managed care program needed by the patient, have an available bed and are willing to accept the patient.  08/25/2013  Patient/family informed of [facility]' ownership interest in [facility], as well as of the fact that they are under no obligation to receive care at this facility.  PASARR submitted to EDS on  PASARR number received from EDS on   FL2 transmitted to all facilities in geographic area requested by pt/family on  08/25/2013 FL2 transmitted to all facilities within larger geographic area on   Patient informed that his/her managed care company has contracts with or will negotiate with  certain facilities, including the following:     Patient/family informed of bed offers received:   Patient chooses bed at  Physician recommends and patient chooses bed at    Patient to be transferred to  on   Patient to be transferred to facility by   The following physician request were entered in Epic:   Additional Comments:  [name], LCSW [phone]

## 2013-08-25 NOTE — Progress Notes (Signed)
INITIAL NUTRITION ASSESSMENT  DOCUMENTATION CODES Per approved criteria  -Not Applicable   INTERVENTION: Ensure Complete po BID, each supplement provides 350 kcal and 13 grams of protein.  NUTRITION DIAGNOSIS: Inadequate oral intake related to acute illness  as evidenced by dehydration on admission, metabolic encephalopathy and UTI, pt refused breakfast, ate 50% lunch.   Goal: Pt to meet >/= 90% of their estimated nutrition needs   Monitor:  Po intake, labs and wt trends  Reason for Assessment: Malnutrition Screen Score= 2  79 y.o. female  Admitting Dx: Metabolic encephalopathy  ASSESSMENT: Pt confused and combative. Limited oral intake likely associated with associated with current illness. Son says pt was driving until 2-3 weeks ago. She usually feeds herself but is unable to currently. She is trying to get out of bed continually during my visit.  Unable to complete nutrition focused exam due to pt current status. She is at risk for moderate to severe protein calorie malnutrition due to her acute and chronic illness and advanced age. Disposition: Family pursuing ALF [facility])   Patient Active Problem List   Diagnosis Date Noted  . UTI (lower urinary tract infection) 08/24/2013  . Dementia 08/24/2013  . Metabolic encephalopathy 08/24/2013  . Dehydration 08/24/2013  . Acute on chronic kidney disease, stage 3 08/24/2013  . Hypokalemia 08/24/2013  . Constipation 01/04/2013  . Diarrhea 12/21/2012  . Memory disorder 12/21/2012  . Hypoadrenalism 12/21/2012  . Hypertension 12/21/2012  . Hyperlipidemia 12/21/2012  . Malignant neoplasm of upper-outer quadrant of female breast 02/25/2012  . DCIS (ductal carcinoma in situ) of breast 02/19/2012   Height: Ht Readings from Last 1 Encounters:  08/24/13 5\' 2"  (1.575 m)    Weight: Wt Readings from Last 1 Encounters:  08/24/13 129 lb 3 oz (58.6 kg)    Ideal Body Weight: 110# (50 kg)  % Ideal Body Weight: 117%  Wt  Readings from Last 10 Encounters:  08/24/13 129 lb 3 oz (58.6 kg)  08/09/13 130 lb (58.968 kg)  07/26/13 130 lb 4.8 oz (59.104 kg)  04/05/13 121 lb 12.8 oz (55.248 kg)  03/01/13 126 lb 1.6 oz (57.199 kg)  02/25/13 126 lb 3.2 oz (57.244 kg)  01/04/13 123 lb 1.6 oz (55.838 kg)  12/21/12 116 lb 6.4 oz (52.799 kg)  09/15/12 124 lb 9.6 oz (56.518 kg)  04/02/12 135 lb 6.4 oz (61.417 kg)    Usual Body Weight: 125-130#  % Usual Body Weight:   BMI:  Body mass index is 23.62 kg/(m^2).normal range  Estimated Nutritional Needs: Kcal: 2952-8413 Protein: 70-77 gr Fluid: 1800 ml/day  Skin: No issues noted  Diet Order: General  EDUCATION NEEDS: -Education not appropriate at this time   Intake/Output Summary (Last 24 hours) at 08/25/13 1244 Last data filed at 08/25/13 1235  Gross per 24 hour  Intake    590 ml  Output      2 ml  Net    588 ml    Last BM: PTA  Labs:   Recent Labs Lab 08/24/13 1224 08/25/13 1040  NA 136 138  K 3.3* 3.5  CL 97 102  CO2 31 23  BUN 20 15  CREATININE 1.32* 1.01  CALCIUM 10.3 9.6  GLUCOSE 101* 125*    CBG (last 3)  No results found for this basename: GLUCAP,  in the last 72 hours  Scheduled Meds: . cefTRIAXone (ROCEPHIN)  IV  1 g Intravenous Q24H  . DULoxetine  120 mg Oral Daily  . feeding supplement  237  mL Oral BID BM  . heparin subcutaneous  5,000 Units Subcutaneous Q8H  . hydrocortisone  10 mg Oral Daily  . loteprednol  1 drop Both Eyes QID  . lubiprostone  24 mcg Oral Q breakfast  . multivitamin with minerals  1 tablet Oral Daily  . simvastatin  40 mg Oral QHS  . tuberculin  5 Units Intradermal Once    Continuous Infusions:   Past Medical History  Diagnosis Date  . GERD (gastroesophageal reflux disease)   . Colitis, ischemic   . Osteoarthritis   . Breast cancer     Right Breast - May 2013/ Left Breast - 2003  . Anxiety   . Stress incontinence, female   . Constipation - functional   . Hypertension   . Headache(784.0)    . Hx: UTI (urinary tract infection)   . HOH (hard of hearing)     both ears; refuses to wear hearing aides  . Dementia   . Benign essential tremor     left hand; intermittent  . Hyperlipidemia   . Adrenal gland hypofunction     takes cortisone daily  . S/P radiation therapy 04/15/12 -05/13/12    RIGHT BREAST  . Chronic constipation 01/04/13  . Fatigue 02/25/13    chronic  . Collar bone fracture     Past Surgical History  Procedure Laterality Date  . Tonsillectomy      At age 69  . Lumbar laminectomy    . Abdominal hysterectomy    . Bunionectomy      bilateral  . Rotor cuff      Right Shoulder  . Upper gastrointestinal endoscopy    . Kidney stone    . Knee surgery  1987    L  . Foot surgery      left foot 1994, right foot 1995  . Lumpectomy  2003  . Eye surgery  2005  . Back surgery    . Breast surgery  2003    left breast  . Tonsillectomy    . Breast surgery  2013    Royann Shivers MS,RD,LDN,CSG Office: [phone] Pager: [phone]

## 2013-08-25 NOTE — Clinical Social Work Note (Signed)
Bonnie [name] from [facility] came to assess patient.  Facility cannot take patient at present, patient behaviors include hitting, biting, swinging at staff.  Facility does not have memory care unit.  [employer] staff has seen patient at their facility in recent past visiting friends and did not observe any problem behaviors at that point.  Son confirms that patient is not in her normal state of health.  [employer] wonders if behaviors are result of UTI, wants to come back when patient is medically clear, can return 9/29 if needed.  Cannot offer bed at present.  Santa Genera, LCSW Clinical Social Worker [phone])

## 2013-08-25 NOTE — Progress Notes (Signed)
OT Screen Note  Patient Details Name: Bonnie [name] MRN: [id number] DOB: [birth year]   Cancelled Treatment:    Reason Eval/Treat Not Completed: OT screened, no needs identified, will sign off. Patient oriented to self only. States she lives with her mother, but lives alone with rotating help from 2 sons. Patient followed 1 step commands 25-50% of the time during assessment of BUE. Due to cognitive deficits, recommend nursing home with memory care unit for safety.     Limmie Patricia, OTR/L,CBIS    08/25/2013, 8:39 AM

## 2013-08-25 NOTE — Clinical Social Work Psych Note (Signed)
Clinical Social Work Department BRIEF PSYCHOSOCIAL ASSESSMENT 08/25/2013  Patient:  Bonnie [name], Bonnie [name]     Account Number:  [id number]     Admit date:  08/24/2013  Clinical Social Worker:  Nancie Neas  Date/Time:  08/25/2013 03:35 PM  Referred by:  Physician  Date Referred:  08/25/2013 Referred for  ALF Placement   Other Referral:   Interview type:  Family Other interview type:   [name]- son    PSYCHOSOCIAL DATA Living Status:  ALONE Admitted from facility:   Level of care:   Primary support name:  [name] Primary support relationship to patient:  CHILD, ADULT Degree of support available:   supportive    CURRENT CONCERNS Current Concerns  Post-Acute Placement   Other Concerns:    SOCIAL WORK ASSESSMENT / PLAN CSW met with pt and pt's son, [name], and his wife. Pt disoriented and slightly aggitated. Son states that pt lives alone, but the family has been staying with her around the clock for the past several weeks. Per son, pt began experiencing more difficulty 6 months ago. She was driving until this point. He also explains pt has had a UTI for the last 3 weeks. He states pt ambulates independently, and has been a limited assist with bathing, dressing, and toileting since UTI. She has also been incontinent during this time. Son explained that  pt has a long term care policy, but his brother does most of the finances. His brother arrived and above was discussed with him as well. They are aware that they will need to work out payment with facility. They had been interested in [facility]  He would like to look at ALF for placement. Per MD, pt most likely will need memory care unit.   Assessment/plan status:  Psychosocial Support/Ongoing Assessment of Needs Other assessment/ plan:   Information/referral to community resources:   ALF list    PATIENT'S/FAMILY'S RESPONSE TO PLAN OF CARE: Pt unable to discuss plan of care. Family request CSW initiate ALF bed search. CSW requested TB  skin test.       [name], LCSW [phone]

## 2013-08-25 NOTE — Progress Notes (Signed)
UR chart review completed.  

## 2013-08-25 NOTE — Clinical Social Work Note (Signed)
Pt assessed by both [facility] and [address] today. Neither can offer a bed. [facility] does not have memory care unit and would need to re-assess pt at a later date to determine if they can meet needs. [facility] does not have a bed available on memory care unit. CSW discussed with pt's sons who are in agreement that eventually they want pt at [facility]. They request that CSW fax information to [facility] and [facility] which are sister facilities to [place] and could facilitate a transfer when bed is available at [facility]. Pt is currently on wait list there. Sons are very upset about possibility of d/c tomorrow. CSW attempted to reassure them that MD will evaluate tomorrow before any decisions are made. They feel that pt should not be d/c until she is completely better. MD notified of above. CSW will follow up in AM.   [name], LCSW [phone]

## 2013-08-25 NOTE — Progress Notes (Signed)
Pt's TB skin test placed in [facility].

## 2013-08-25 NOTE — Progress Notes (Signed)
At 0400 pt again became upset and wanted to get up to go see her mother.  Attempts to reorient pt was unsuccessful and she began to swing at staff and landed a punch to the back of [place], [place]. Security and Nursing supervisor Olegario Messier, RN called to bedside.   Dr. Sharl Ma called and asked to come to pt's room to see her and he did.  While at the bedside he gave an order for Lorazepam.  Pt continued to be aggressive and fight staff with mitten's in use.  Lorazepam given per orders.  Pt continues to attempt to get out of bed and attempts to kick and hit staff.  A family member BJ Odis Luster  has arrived at bedside and states this is the same behavior exhibited at home.  Family member and sitter remain at bedside and mittens are still in use.  Nursing staff to continue to monitor.

## 2013-08-25 NOTE — Progress Notes (Signed)
At 0110 pt became upset and wanted her purse.  Staff tried to convince her we did not have her purse and she became belligerent and began to kick and hit at staff.  Pt began to try to pull her IV out and was placed in mittens to prevent her from pulling her IV out.  I called the nursing supervisor to bedside along with myself and safety sitter Myrene Buddy, [place] and pt continued to accuse staff to taking her purse and money. We checked the room for the patient's purse and it was not seen.  I attempted to call patient's son [name] at [phone] and he did not answer so I left a message for him to return my call.  Dr. Sharl Ma notified of the above and he gave and order for Lorazepam.  Lorazepam was given per orders.  At this time pt remain in bed with mittens in use and sitter at bedside.  She is  calmer and more cooperative after lorazepam.  Awaiting call from pt's son.  Nursing staff to continue to monitor.

## 2013-08-25 NOTE — Care Management Note (Addendum)
    Page 1 of 2   08/29/2013     10:46:34 AM   CARE MANAGEMENT NOTE 08/29/2013  Patient:  LIRA, STEPHEN   Account Number:  [id number]  Date Initiated:  08/25/2013  Documentation initiated by:  Sharrie Rothman  Subjective/Objective Assessment:   Pt admitted from home with metabolic encephalopathy and UTI. Pt lives alone but in the last few weeks, her 2 sons have been taking turns staying with her. Pt has been independent with ADL's.     Action/Plan:   Pt very confused and trying to get out of bed. Family has been looking at [facility] for ALF placement. CSW is aware of placement and will assist and arrange discharge when medically stable.   Anticipated DC Date:  08/29/2013   Anticipated DC Plan:  ASSISTED LIVING / REST HOME  In-house referral  Clinical Social Worker      DC Planning Services  CM consult      Choice offered to / List presented to:             Status of service:  Completed, signed off Medicare Important Message given?  YES (If response is "NO", the following Medicare IM given date fields will be blank) Date Medicare IM given:  08/26/2013 Date Additional Medicare IM given:    Discharge Disposition:  HOME/SELF CARE  Per UR Regulation:    If discussed at Long Length of Stay Meetings, dates discussed:    Comments:  08/29/13 1040 Arlyss Queen, RN BSN CM Pt discharged home on 08/27/13. FAmily did not show to pick pt up and was unable to be contacted by phone numbers on the chart. Family also did now show up on 08/28/13 was not available by phone as well. Family did show up to hospital on 08/29/13 and plan to take pt home and place pt at ALF when bed available. Family was provided a list of private duty sitter agencies and also a list of HH agencies were given to pts son on Friday for discharge over the weekend. CSW followed up with family on 08/29/13 and they agreed that they were taking pt home today. NO other CM needs noted.  08/26/13 1445 Arlyss Queen, RN BSN  CM Pt potential discharge over the weekend. List of HH agencies and private duty agencies left for pts sons in the room. CM spoke with [name] over the phone and he was made aware of where list had been placed. [name] also made aware that staff can arrange [facility] if they wish and IM reviewed over the phone. Pts nurse aware also.  08/25/13 1130 Arlyss Queen, RN BSN CM

## 2013-08-25 NOTE — Progress Notes (Signed)
PT Screen Note  Patient Details Name: Bonnie [name] MRN: [id number] DOB: 03/22/1927   Screen Treatment:    Reason Eval/Treat Not Completed: PT screened, no needs identified, will sign off;Patient's level of consciousness.  Pt lives alone, has some family that come in intermittently.  Son states that she has been having difficulty with her memory for about 8 months.  Pt son reports they are looking at [facility] for placement.  Educated son on nursing facility with dementia units which may be safer for the pt.  When talking with pt she reports "I'm at [facility]...wait that's not right. I don't know where I am." Pt unable to follow any PT commands, using verbal, tactile, visual and demonstration cueing, today when assessing LE function and strength.   [name] 08/25/2013, 8:34 AM

## 2013-08-26 DIAGNOSIS — I359 Nonrheumatic aortic valve disorder, unspecified: Secondary | ICD-10-CM

## 2013-08-26 LAB — LIPID PANEL
Cholesterol: 175 mg/dL (ref 0–200)
LDL Cholesterol: 81 mg/dL (ref 0–99)
Total CHOL/HDL Ratio: 2.5 RATIO
Triglycerides: 117 mg/dL (ref <150)
VLDL: 23 mg/dL (ref 0–40)

## 2013-08-26 LAB — URINE CULTURE

## 2013-08-26 MED ORDER — LORAZEPAM 2 MG/ML IJ SOLN
2.0000 mg | Freq: Once | INTRAMUSCULAR | Status: DC
  Filled 2013-08-26: qty 1

## 2013-08-26 MED ORDER — LORAZEPAM 2 MG/ML IJ SOLN
2.0000 mg | INTRAMUSCULAR | Status: DC | PRN
  Administered 2013-08-26 – 2013-08-28 (×5): 2 mg via INTRAMUSCULAR
  Filled 2013-08-26 (×4): qty 1

## 2013-08-26 NOTE — Clinical Social Work Note (Signed)
CSW spoke with [name] on phone as no family present at bedside. [name] seems to be much more understanding of situation today. He reports he had a conversation with MD and is more reassured. CSW discussed bed situation. [facility] may have a bed open on memory care unit next week. This is family's preference. Their sister facility, [facility] has offered a bed to pt for weekend with understanding that pt would transfer when a bed was open at [facility]. [name] reports they had already talked to [facility] and were aware of above. If pt is d/c over weekend, they plan to take pt home with private duty care. If pt remains in hospital, CSW will follow up on Monday with [facility] for update. [name] is aware that PCP will need to complete FL2 if they place her from home. CM notified of need for private duty care list.   [name], LCSW [phone]

## 2013-08-26 NOTE — Progress Notes (Signed)
*  PRELIMINARY RESULTS* Echocardiogram 2D Echocardiogram has been performed.  Bonnie [name] 08/26/2013, 5:24 PM 

## 2013-08-26 NOTE — Progress Notes (Signed)
[facility] PROGRESS NOTE  Bonnie [name] WJX:[id number] DOB: 12/20/1926 DOA: 08/24/2013 PCP: Cassell Smiles., MD  Assessment/Plan  Metabolic encephalopathy, likely due to urinary tract infection in setting of underlying dementia. May also have some medication side effect from benzodiazepines and some dehydration.  EKG demonstrates RBBB with bifascicular block, similar to previous.  Prolonged QTc of > .  Ammonia level wnl.   - Urine culture negative -  Discontinue antibiotics -   Unable to complete MRI due to patient cooperation. CT of the head demonstrated atrophy with small vessel disease. Prior small lacunar infarct in left cerebellum. No acute infarcts. -  ECHO pending -  Carotid duplex pending and  Dementia, worsening  - TSH 0.467, B12 527, RPR nonreactive - Minimize sedating medications if possible - Falls precautions  - Frequent reorientation  - Avoid haldol, seroquel, etc due to prolonged QTc - will need to rely on benzodiazepines for sedation for now - Continue statin, however these can worsen dementia symptoms   Acute on chronic renal insufficiency -  F/u AM BMP - Minimize nephrotoxins  - Renally dosed medications  -  Discontinue IVF for now  Dehydration resolved  Hypertension/hyperlipidemia, blood pressure wnl. Trend BP  - Hold HCTZ  - Continue simvastatin   GERD, stable.   Adrenal insufficiency, continue steroids.   Chronic diarrhea and constipation.  - Continue home medications   Depression and anxiety, stable. Continue Cymbalta.  History of breast cancer, currently in remission.   Hypokalemia, likely secondary to diuretic use.  Resolved with oral supplementation. - Hold diuretic   Patient apparently goes several days without eating or drinking intermittently at home. At risk for moderate to severe protein calorie malnutrition  - Regular diet with supplements   Diet: Regular  Access: PIV  IVF: OFF Proph: Lovenox   Code Status: DO  NOT RESUSCITATE  Family Communication: Spoke with the patient and son Disposition Plan: Anticipate discharge to ALF dementia unit or to home with home health services to await placement in an ALF dementia unit   Consultants:  None  Procedures:  CT head   Echo  Carotid duplex  Antibiotics:  Ceftriaxone 9/24 >> 9.26  Was on cipro and then bactrim as outpatient prior to admission   HPI/Subjective:  Patient confused and agitated this morning.    Objective: Filed Vitals:   08/24/13 2210 08/25/13 2148 08/26/13 0518 08/26/13 1430  BP: 116/52 146/69 145/64 125/70  Pulse: 84 83 78 90  Temp:  97.7 F (36.5 C) 97.8 F (36.6 C) 98.3 F (36.8 C)  TempSrc: Oral Oral Oral Oral  Resp: 20 20 18 18   Height:      Weight:      SpO2: 95% 97% 97% 93%    Intake/Output Summary (Last 24 hours) at 08/26/13 1756 Last data filed at 08/26/13 1251  Gross per 24 hour  Intake    600 ml  Output      1 ml  Net    599 ml   Filed Weights   08/24/13 1057 08/24/13 1654  Weight: 58.968 kg (130 lb) 58.6 kg (129 lb 3 oz)    Exam:   General:  Caucasian female, No acute distress, awake and disoriented, yelling and kicking at me during exam  HEENT:  NCAT, MMM  Cardiovascular:  RRR, nl S1, S2 no mrg, 2+ pulses, warm extremities  Respiratory:  CTAB, no increased WOB  Abdomen:   NABS, soft, NT/ND  MSK:   Normal tone and bulk, trace LEE  Neuro:  Grossly moves all extremities  Psych:  Not oriented to person, place, or time this morning.    Data Reviewed: Basic Metabolic Panel:  Recent Labs Lab 08/24/13 1224 08/25/13 1040  NA 136 138  K 3.3* 3.5  CL 97 102  CO2 31 23  GLUCOSE 101* 125*  BUN 20 15  CREATININE 1.32* 1.01  CALCIUM 10.3 9.6   Liver Function Tests: No results found for this basename: AST, ALT, ALKPHOS, BILITOT, PROT, ALBUMIN,  in the last 168 hours No results found for this basename: LIPASE, AMYLASE,  in the last 168 hours  Recent Labs Lab 08/25/13 0456   AMMONIA 26   CBC:  Recent Labs Lab 08/24/13 1224  WBC 5.7  NEUTROABS 4.0  HGB 12.8  HCT 38.1  MCV 90.3  PLT 403*   Cardiac Enzymes: No results found for this basename: CKTOTAL, CKMB, CKMBINDEX, TROPONINI,  in the last 168 hours BNP (last 3 results) No results found for this basename: PROBNP,  in the last 8760 hours CBG: No results found for this basename: GLUCAP,  in the last 168 hours  Recent Results (from the past 240 hour(s))  URINE CULTURE     Status: None   Collection Time    08/24/13 11:55 AM      Result Value Range Status   Specimen Description URINE, CLEAN CATCH   Final   Special Requests NONE   Final   Culture  Setup Time     Final   Value: 08/25/2013 01:44     Performed at [facility] Count     Final   Value: NO GROWTH     Performed at [facility]   Culture     Final   Value: NO GROWTH     Performed at [facility]   Report Status 08/26/2013 FINAL   Final     Studies: Ct Head Wo Contrast  08/26/2013   CLINICAL DATA:  Confusion/altered mental status  EXAM: CT HEAD WITHOUT CONTRAST  TECHNIQUE: Contiguous axial images were obtained from the base of the skull through the vertex without intravenous contrast.  COMPARISON:  December 11, 2012.  FINDINGS: There is moderate diffuse atrophy. There is no mass, hemorrhage, extra-axial fluid collection, or midline shift. There is patchy small vessel disease in the centra semiovale bilaterally. There is evidence of a prior small lacunar type infarct in the posterior mid left cerebellum. There are no new gray-white compartment lesions. There is no demonstrable acute infarct.  Bony calvarium appears intact. Bones do appear osteoporotic. Mastoid air cells are clear. There is opacification of an ethmoid air cell on the right.  IMPRESSION: Atrophy with small vessel disease. Prior small lacunar infarct in the left cerebellum. No mass, hemorrhage, or acute infarct identified. Mild right-sided ethmoid  sinus disease.   Electronically Signed   By: Bretta Bang   On: 08/26/2013 09:49    Scheduled Meds: . DULoxetine  120 mg Oral Daily  . feeding supplement  237 mL Oral BID BM  . heparin subcutaneous  5,000 Units Subcutaneous Q8H  . hydrocortisone  10 mg Oral Daily  . LORazepam  2 mg Intravenous Once  . loteprednol  1 drop Both Eyes QID  . lubiprostone  24 mcg Oral Q breakfast  . multivitamin with minerals  1 tablet Oral Daily  . simvastatin  40 mg Oral QHS  . tuberculin  5 Units Intradermal Once   Continuous Infusions:    Principal Problem:  Metabolic encephalopathy Active Problems:   Diarrhea   Memory disorder   Hypoadrenalism   Hypertension   Hyperlipidemia   Constipation   UTI (lower urinary tract infection)   Dementia   Dehydration   Acute on chronic kidney disease, stage 3   Hypokalemia    Time spent: 30 min    Crissa Sowder, [facility]  [facility] Pager [phone]. If 7PM-7AM, please contact night-coverage at [email or web address], password [facility] 08/26/2013, 5:56 PM  LOS: 2 days

## 2013-08-26 NOTE — Progress Notes (Addendum)
At approximately 1830 patient became very upset stating that someone was trying to hurt her.  She standing beside the bed and was pulling away from the NT and sitter.  Patient stated "that woman is trying to hurt me and take my comb".  Patient was clenching her comb in her hand and was holding it between her legs.  Yelling and swinging at times.  Attempts to reorient patient were unsuccessful.  Patient refused to sit down wanted to stand up; however, did agree to sit on bedside commode. Patient pulled gown off. Charge Nurse notified.  Patient pulled IV out of left arm.  Dr. Malachi Bonds notified and route of PRN Ativan changed to IM.  IM Ativan administered in left gluteal.  Patient sitting on edge of bed with NT at side.  Stated she wanted to put her clothes on from home.  Patient calming down.  Night sitter and NT at bedside.  Oncoming nurse aware.  Nursing will continue to monitor.

## 2013-08-27 LAB — HEMOGLOBIN A1C: Hgb A1c MFr Bld: 5.8 % — ABNORMAL HIGH (ref <5.7)

## 2013-08-27 MED ORDER — AMLODIPINE BESYLATE 2.5 MG PO TABS: 2.5000 mg | ORAL_TABLET | Freq: Every day | ORAL | Status: DC

## 2013-08-27 MED ORDER — LORAZEPAM 2 MG PO TABS: 2.0000 mg | ORAL_TABLET | Freq: Four times a day (QID) | ORAL | Status: DC | PRN

## 2013-08-27 MED ORDER — ATORVASTATIN CALCIUM 10 MG PO TABS
10.0000 mg | ORAL_TABLET | Freq: Every day | ORAL | Status: DC
  Administered 2013-08-27 – 2013-08-28 (×2): 10 mg via ORAL
  Filled 2013-08-27 (×3): qty 1

## 2013-08-27 MED ORDER — AMLODIPINE BESYLATE 5 MG PO TABS
2.5000 mg | ORAL_TABLET | Freq: Every day | ORAL | Status: DC
  Administered 2013-08-27 – 2013-08-29 (×3): 2.5 mg via ORAL
  Filled 2013-08-27 (×3): qty 1

## 2013-08-27 MED ORDER — ENSURE COMPLETE PO LIQD: 237.0000 mL | Freq: Two times a day (BID) | ORAL | Status: DC

## 2013-08-27 MED ORDER — PRAVASTATIN SODIUM 80 MG PO TABS: 80.0000 mg | ORAL_TABLET | Freq: Every day | ORAL | Status: AC

## 2013-08-27 NOTE — Progress Notes (Deleted)
FL2 updated and faxed to Dept. 300. Please reconsult if further Social Work needs arise. CSW signing off.   Honora Searson Morgan, LCSWA Weekend CSW [phone]  

## 2013-08-27 NOTE — Progress Notes (Addendum)
Clinical Child psychotherapist (CSW) contacted [facility] in [place] and they reported no admissions over the weekend. CSW called [facility] and could not get in touch with admissions personal. CSW attempted to contacted son [name] and the phone number listed is no longer working.  Jetta Lout, LCSWA Weekend CSW [phone]  Lynn Ito [phone] admissions for [facility] returned my call and tried to set up an admissions with RN's and was not successful. Lawson Fiscal reported that she will try tomorrow to set up an admission.   Jetta Lout, LCSWA Weekend CSW [phone]

## 2013-08-27 NOTE — Discharge Summary (Addendum)
Physician Discharge Summary  Bonnie [name]:[id number] DOB: [birth year] DOA: 08/24/2013  PCP: Cassell Smiles., MD  Admit date: 08/24/2013 Discharge date: 08/29/2013  Recommendations for Outpatient Follow-up:  1. Repeat blood pressure in 2 weeks.  PCP to please address possibility of dementia medications as outpatient 2. Repeat CBC and BMP in 2 weeks to see if platelets, potassium have normalized  Discharge Diagnoses:  Principal Problem:   Metabolic encephalopathy Active Problems:   Diarrhea   Memory disorder   Hypoadrenalism   Hypertension   Hyperlipidemia   Constipation   UTI (lower urinary tract infection)   Dementia   Dehydration   Acute on chronic kidney disease, stage 3   Hypokalemia   Discharge Condition: stable, improved  Diet recommendation: regular with supplements  Wt Readings from Last 3 Encounters:  08/24/13 58.6 kg (129 lb 3 oz)  08/09/13 58.968 kg (130 lb)  07/26/13 59.104 kg (130 lb 4.8 oz)    History of present illness:  The patient is a 79 y.o. year-old female with history of HTN, HLD, dementia, chronic constipation and diarrhea, adrenal insufficiency, acid reflux, breast cancer currently in remission and last radiation treatment approximately 6 months ago, anxiety and depression who presents with altered mental status. The patient has had problems with memory and decreasing function for over a year. She has had a further decline since her last treatment for breast cancer approximately 6 months ago. Over the last 3 weeks, she has had multiple urinary tract infections which required antibiotics initially with ciprofloxacin and afterwords with Bactrim. She has had a fall and that time and has had increasing problems with confusion, hallucinations, urinary and bowel incontinence. Her family was concerned that she was having hallucinations of her mother and has been asking repeatedly where her mother is over the last day, and they are concerned that she may have  another urinary tract infection. She has still been taking Bactrim and her last urine culture from 3 weeks ago was pansensitive Escherichia coli. She has not had fevers, chills, vomiting, shortness of breath, cough. She has been having her baseline stool textures  Hospital Course:   Metabolic encephalopathy, likely due to hospital acquired delirium in setting of underlying progressive dementia. May also have some medication side effect from benzodiazepines and some dehydration. EKG demonstrates RBBB with bifascicular block, similar to previous. Prolonged QTc of > . Ammonia level wnl. Urine culture was negative so her empiric ceftriaxone was discontinued.  Unable to complete MRI due to patient cooperation. CT of the head demonstrated atrophy with small vessel disease an d prior small lacunar infarct in left cerebellum which may be related to high blood pressure. No acute infarcts. ECHO demonstrated preserved ejection fracture, grade 1 diastolic dysfunction.  Telemetry did not demonstrate any atrial fibrillation. Cholesterol panel as below.  Did not start statin as this may sometimes cause further memory problems.  Consider outpatient carotid duplex if concerns about further strokes or progression.  She was started on low dose norvasc.  Avoided BB due to heart block, ACEI due to recent AKI, diuretics due to unpredictable oral intake.    Dementia, worsening.  TSH 0.467, B12 527, RPR nonreactive.  Minimize sedating medications if possible.  She was placed on fall precautions and did not experience any falls.  Avoided haldol, seroquel and other related antipsychotic medications secondary to prolonged QTc.  Were unfortunately only able to rely on benzodiazepines for sedation secondary to agitation.    Lacunar CVA.  Continue good blood pressure and cholesterol  control.  Start full dose aspirin.    Acute on chronic renal insufficiency.  Resolved with IVF.    Dehydration resolved with  fluids.  Hypertension/hyperlipidemia, blood pressure mildly elevated. Trend BP.  Started low dose norvasc and transitioned simvastatin to equivalent dose of pravastatin at discharge due to drug drug interaction.    GERD, stable.   Adrenal insufficiency, continue steroids.   Chronic diarrhea and constipation.  Continue home medications.  Depression and anxiety, stable. Continue Cymbalta.   History of breast cancer, currently in remission.   Hypokalemia, likely secondary to diuretic use. Resolved with oral supplementation and discontinuation of HCTZ.  Patient apparently goes several days without eating or drinking intermittently at home. At risk for moderate to severe protein calorie malnutrition.  Regular diet with supplements    Consultants:  None Procedures:  CT head  Echo  Carotid duplex Antibiotics:  Ceftriaxone 9/24 >> 9.26  Was on cipro and then bactrim as outpatient prior to admission    Discharge Exam: Filed Vitals:   08/29/13 0500  BP: 143/66  Pulse: 82  Temp: 98.4 F (36.9 C)  Resp: 18   Filed Vitals:   08/28/13 0601 08/28/13 1519 08/28/13 2215 08/29/13 0500  BP: 123/75 143/71 138/82 143/66  Pulse: 83 108 86 82  Temp: 98.3 F (36.8 C) 98 F (36.7 C) 98.1 F (36.7 C) 98.4 F (36.9 C)  TempSrc: Oral     Resp: 18 18 18 18   Height:      Weight:      SpO2: 96% 95% 96% 97%    General: Caucasian female, No acute distress, awake and disoriented, pleasant and cooperative today HEENT: NCAT, MMM  Cardiovascular: RRR, nl S1, S2 no mrg, 2+ pulses, warm extremities  Respiratory: CTAB, no increased WOB  Abdomen: NABS, soft, NT/ND  MSK: Normal tone and bulk, trace LEE  Neuro: Grossly moves all extremities  Psych: Oriented to person but not place or time this morning.    Discharge Instructions      Discharge Orders   Future Appointments Provider Department Dept Phone   10/04/2013 10:30 AM [facility] Covering Provider [facility] [phone]    01/31/2014 10:30 AM Malissa Hippo, MD [facility] [phone]   03/07/2014 3:00 PM Lonie Peak, MD [facility] [phone]   Future Orders Complete By Expires   Call MD for:  difficulty breathing, headache or visual disturbances  As directed    Call MD for:  extreme fatigue  As directed    Call MD for:  hives  As directed    Call MD for:  persistant dizziness or light-headedness  As directed    Call MD for:  persistant nausea and vomiting  As directed    Call MD for:  severe uncontrolled pain  As directed    Call MD for:  temperature >100.4  As directed    Diet general  As directed    Discharge instructions  As directed    Comments:     Ms. Garno was admitted to the hospital with agitation and confusion which has been rapidly progressive over the last three weeks.  She currently does not have a urinary tract infection.  She was tested for liver problems, vitamin deficiency, thyroid problems, and syphilis.  All these tests were normal.  She had a lacunar stroke on her head CT and strokes can contribute to dementia.  We tried to identify risk factors for stroke and she has high  blood pressure.  Her cholesterol is well controlled and she does not have diabetes.  Her heart ultrasound did not show any valve problems which would explain stroke and her heart rhythm has been regular.  She should stop her HCTZ blood pressure medication because this can quickly lead to dehydration if she does not eat and drink regularly.  Also, this probably caused her low potassium.  For her blood pressure, i have started her on a low dose of amlodipine and she should have her blood pressure checked again in a few weeks to see if it needs to be adjusted.  There is a drug-drug interaction between amlodipine and simvastatin so i have changed the simvastatin to pravastatin (an equal dose based on conversion).  She should have 24 hour supervision and you may use ativan as  needed for anxiety, agitation, and sleep as prescribed.  Please stop the xanax.   Driving Restrictions  As directed    Comments:     No driving, operating heavy machinery, babysitting.   Increase activity slowly  As directed        Medication List    STOP taking these medications       ALPRAZolam 0.5 MG tablet  Commonly known as:  XANAX     ciprofloxacin 500 MG tablet  Commonly known as:  CIPRO     hydrochlorothiazide 25 MG tablet  Commonly known as:  HYDRODIURIL     HYDROcodone-acetaminophen 10-325 MG per tablet  Commonly known as:  NORCO     HYDROcodone-acetaminophen 5-325 MG per tablet  Commonly known as:  NORCO/VICODIN     simvastatin 40 MG tablet  Commonly known as:  ZOCOR     sulfamethoxazole-trimethoprim 800-160 MG per tablet  Commonly known as:  BACTRIM DS      TAKE these medications       amLODipine 2.5 MG tablet  Commonly known as:  NORVASC  Take 1 tablet (2.5 mg total) by mouth daily.     aspirin EC 325 MG tablet  Take 1 tablet (325 mg total) by mouth daily.     BENEFIBER PO  Take by mouth daily as needed (constipation).     CENTRUM SILVER ADULT 50+ PO  Take 1 tablet by mouth daily.     cyanocobalamin 1000 MCG/ML injection  Commonly known as:  (VITAMIN B-12)  Inject 1,000 mcg into the muscle every 30 (thirty) days.     docusate sodium 100 MG capsule  Commonly known as:  COLACE  Take 100 mg by mouth at bedtime as needed for constipation.     DULoxetine 60 MG capsule  Commonly known as:  CYMBALTA  Take 120 mg by mouth daily.     feeding supplement Liqd  Take 237 mLs by mouth 2 (two) times daily between meals.     hydrocortisone 20 MG tablet  Commonly known as:  CORTEF  Take 10 mg by mouth daily.     LORazepam 2 MG tablet  Commonly known as:  ATIVAN  Take 1 tablet (2 mg total) by mouth every 6 (six) hours as needed for anxiety (agitation or sleep).     loteprednol 0.5 % ophthalmic suspension  Commonly known as:  LOTEMAX  Place 1 drop  into both eyes 4 (four) times daily.     lubiprostone 24 MCG capsule  Commonly known as:  AMITIZA  Take 24 mcg by mouth daily with breakfast.     pravastatin 80 MG tablet  Commonly known as:  PRAVACHOL  Take  1 tablet (80 mg total) by mouth daily.       Follow-up Information   Follow up with Cassell Smiles., MD. Schedule an appointment as soon as possible for a visit in 2 weeks.   Specialty:  Internal Medicine   Contact information:   [address] [place] [address] [phone]        The results of significant diagnostics from this hospitalization (including imaging, microbiology, ancillary and laboratory) are listed below for reference.    Significant Diagnostic Studies: Dg Chest 2 View  08/09/2013   *RADIOLOGY REPORT*  Clinical Data: Fall, bruising upper chest/right shoulder  CHEST - 2 VIEW  Comparison: None.  Findings: Lungs are clear. No pleural effusion or pneumothorax.  The heart is normal in size.  Surgical clips overlying the right lower chest wall/breast.  Mildly displaced/angulated mid right clavicle fracture.  IMPRESSION: Mildly displaced/angulated mid right clavicle fracture.  No pneumothorax.   Original Report Authenticated By: Charline Bills, M.D.   Dg Shoulder Right  08/09/2013   *RADIOLOGY REPORT*  Clinical Data: Right shoulder pain post fall 2 nights ago  RIGHT SHOULDER - 2+ VIEW  Comparison: 11/6/ 12  Findings: Three views of the right shoulder submitted.  There is diffuse osteopenia.  There is angulated fracture of the mid clavicle shaft.  Glenohumeral joint is preserved.  Mild degenerative changes AC joint.  Mild spurring of the humeral head.  IMPRESSION: There is angulated fracture of the mid clavicle shaft. Glenohumeral joint is preserved.  Mild degenerative changes AC joint.  Mild spurring of the humeral head.   Original Report Authenticated By: Natasha Mead, M.D.   Ct Head Wo Contrast  08/26/2013   CLINICAL DATA:  Confusion/altered  mental status  EXAM: CT HEAD WITHOUT CONTRAST  TECHNIQUE: Contiguous axial images were obtained from the base of the skull through the vertex without intravenous contrast.  COMPARISON:  December 11, 2012.  FINDINGS: There is moderate diffuse atrophy. There is no mass, hemorrhage, extra-axial fluid collection, or midline shift. There is patchy small vessel disease in the centra semiovale bilaterally. There is evidence of a prior small lacunar type infarct in the posterior mid left cerebellum. There are no new gray-white compartment lesions. There is no demonstrable acute infarct.  Bony calvarium appears intact. Bones do appear osteoporotic. Mastoid air cells are clear. There is opacification of an ethmoid air cell on the right.  IMPRESSION: Atrophy with small vessel disease. Prior small lacunar infarct in the left cerebellum. No mass, hemorrhage, or acute infarct identified. Mild right-sided ethmoid sinus disease.   Electronically Signed   By: Bretta Bang   On: 08/26/2013 09:49    Microbiology: Recent Results (from the past 240 hour(s))  URINE CULTURE     Status: None   Collection Time    08/24/13 11:55 AM      Result Value Range Status   Specimen Description URINE, CLEAN CATCH   Final   Special Requests NONE   Final   Culture  Setup Time     Final   Value: 08/25/2013 01:44     Performed at [facility] Count     Final   Value: NO GROWTH     Performed at [facility]   Culture     Final   Value: NO GROWTH     Performed at [facility]   Report Status 08/26/2013 FINAL   Final     Labs: Basic Metabolic Panel:  Recent  Labs Lab 08/24/13 1224 08/25/13 1040  NA 136 138  K 3.3* 3.5  CL 97 102  CO2 31 23  GLUCOSE 101* 125*  BUN 20 15  CREATININE 1.32* 1.01  CALCIUM 10.3 9.6   Liver Function Tests: No results found for this basename: AST, ALT, ALKPHOS, BILITOT, PROT, ALBUMIN,  in the last 168 hours No results found for this basename: LIPASE,  AMYLASE,  in the last 168 hours  Recent Labs Lab 08/25/13 0456  AMMONIA 26   CBC:  Recent Labs Lab 08/24/13 1224  WBC 5.7  NEUTROABS 4.0  HGB 12.8  HCT 38.1  MCV 90.3  PLT 403*   Cardiac Enzymes: No results found for this basename: CKTOTAL, CKMB, CKMBINDEX, TROPONINI,  in the last 168 hours BNP: BNP (last 3 results) No results found for this basename: PROBNP,  in the last 8760 hours CBG: No results found for this basename: GLUCAP,  in the last 168 hours  Time coordinating discharge: 45 minutes  Signed:  Anaira Seay  [facility] 08/29/2013, 12:51 PM

## 2013-08-28 NOTE — Progress Notes (Addendum)
[facility] PROGRESS NOTE  LEAIRA FULLAM ZOX:[id number] DOB: 01-03-1927 DOA: 08/24/2013 PCP: Cassell Smiles., MD  Assessment/Plan  Metabolic encephalopathy, likely due to hospital acquired delirium in setting of underlying progressive dementia. May also have some medication side effect from benzodiazepines and antibiotics, and some dehydration.  Ammonia level wnl. Urine culture was negative so her empiric ceftriaxone was discontinued. Unable to complete MRI due to patient cooperation. CT of the head demonstrated atrophy with small vessel disease an d prior small lacunar infarct in left cerebellum which may be related to high blood pressure. No acute infarcts. ECHO demonstrated preserved ejection fracture, grade 1 diastolic dysfunction. Telemetry did not demonstrate any atrial fibrillation. Cholesterol panel as below. Did not start statin as this may sometimes cause further memory problems. Consider outpatient carotid duplex if concerns about further strokes or progression. She was started on low dose norvasc. Avoided BB due to heart block, ACEI due to recent AKI, diuretics due to unpredictable oral intake.   Dementia, worsening. TSH 0.467, B12 527, RPR nonreactive. Minimize sedating medications if possible. She was placed on fall precautions and did not experience any falls. Avoided haldol, seroquel and other related antipsychotic medications secondary to prolonged QTc.  EKG demonstrated RBBB with bifascicular block, similar to previous. Prolonged QTc of > .  Were unfortunately only able to rely on benzodiazepines for sedation secondary to agitation.   Acute on chronic renal insufficiency. Resolved with IVF.   Dehydration resolved with fluids.   Hypertension/hyperlipidemia, blood pressure mildly elevated. Trend BP. Started low dose norvasc and transitioned simvastatin to equivalent dose of pravastatin at discharge due to drug drug interaction.   GERD, stable.  Adrenal insufficiency,  continue steroids.  Chronic diarrhea and constipation. Continue home medications.  Depression and anxiety, stable. Continue Cymbalta.  History of breast cancer, currently in remission.  Hypokalemia, likely secondary to diuretic use. Resolved with oral supplementation and discontinuation of HCTZ.  Patient apparently goes several days without eating or drinking intermittently at home. At risk for moderate to severe protein calorie malnutrition. Regular diet with supplements   - No change in plan today.  Notified daughter-in-law yesterday morning that patient was ready for discharge, however, the family left and could not be reached.  Left several messages with the one working phone number for daughter-in-law.  Plan to discharge to home once family arrives.    PPD NEGATIVE at 72 hours.    Diet: Regular  Access: PIV  IVF: OFF Proph: Lovenox   Code Status: DO NOT RESUSCITATE  Family Communication:  None Disposition Plan:  To home if family arrives.     Consultants:  None  Procedures:  CT head   Echo  Carotid duplex  Antibiotics:  Ceftriaxone 9/24 >> 9.26  Was on cipro and then bactrim as outpatient prior to admission   HPI/Subjective:  Patient pleasantly confused today.  States that she has some abdominal pain and mild nausea, both of which are improving.   Objective: Filed Vitals:   08/27/13 1233 08/27/13 1400 08/27/13 2040 08/28/13 0601  BP: 120/71 107/70 142/83 123/75  Pulse: 97 127 92 83  Temp: 97.6 F (36.4 C) 97.8 F (36.6 C) 97.9 F (36.6 C) 98.3 F (36.8 C)  TempSrc: Oral Oral Oral Oral  Resp: 18 18 18 18   Height:      Weight:      SpO2: 95% 95% 96% 96%    Intake/Output Summary (Last 24 hours) at 08/28/13 1001 Last data filed at 08/27/13 1200  Gross per 24  hour  Intake    120 ml  Output    300 ml  Net   -180 ml   Filed Weights   08/24/13 1057 08/24/13 1654  Weight: 58.968 kg (130 lb) 58.6 kg (129 lb 3 oz)    Exam:   General:  Caucasian  female, No acute distress, awake, pleasant and disoriented  HEENT:  NCAT, MMM  Cardiovascular:  RRR, nl S1, S2 no mrg, 2+ pulses, warm extremities  Respiratory:  CTAB, no increased WOB  Abdomen:   NABS, soft, NT/ND  MSK:   Normal tone and bulk, trace LEE  Neuro:  Grossly moves all extremities  Psych:  Oriented to person but not place or time this morning.    Data Reviewed: Basic Metabolic Panel:  Recent Labs Lab 08/24/13 1224 08/25/13 1040  NA 136 138  K 3.3* 3.5  CL 97 102  CO2 31 23  GLUCOSE 101* 125*  BUN 20 15  CREATININE 1.32* 1.01  CALCIUM 10.3 9.6   Liver Function Tests: No results found for this basename: AST, ALT, ALKPHOS, BILITOT, PROT, ALBUMIN,  in the last 168 hours No results found for this basename: LIPASE, AMYLASE,  in the last 168 hours  Recent Labs Lab 08/25/13 0456  AMMONIA 26   CBC:  Recent Labs Lab 08/24/13 1224  WBC 5.7  NEUTROABS 4.0  HGB 12.8  HCT 38.1  MCV 90.3  PLT 403*   Cardiac Enzymes: No results found for this basename: CKTOTAL, CKMB, CKMBINDEX, TROPONINI,  in the last 168 hours BNP (last 3 results) No results found for this basename: PROBNP,  in the last 8760 hours CBG: No results found for this basename: GLUCAP,  in the last 168 hours  Recent Results (from the past 240 hour(s))  URINE CULTURE     Status: None   Collection Time    08/24/13 11:55 AM      Result Value Range Status   Specimen Description URINE, CLEAN CATCH   Final   Special Requests NONE   Final   Culture  Setup Time     Final   Value: 08/25/2013 01:44     Performed at [facility] Count     Final   Value: NO GROWTH     Performed at [facility]   Culture     Final   Value: NO GROWTH     Performed at [facility]   Report Status 08/26/2013 FINAL   Final     Studies: No results found.  Scheduled Meds: . amLODipine  2.5 mg Oral Daily  . atorvastatin  10 mg Oral q1800  . DULoxetine  120 mg Oral Daily  .  feeding supplement  237 mL Oral BID BM  . heparin subcutaneous  5,000 Units Subcutaneous Q8H  . hydrocortisone  10 mg Oral Daily  . LORazepam  2 mg Intravenous Once  . loteprednol  1 drop Both Eyes QID  . lubiprostone  24 mcg Oral Q breakfast  . multivitamin with minerals  1 tablet Oral Daily   Continuous Infusions:    Principal Problem:   Metabolic encephalopathy Active Problems:   Diarrhea   Memory disorder   Hypoadrenalism   Hypertension   Hyperlipidemia   Constipation   UTI (lower urinary tract infection)   Dementia   Dehydration   Acute on chronic kidney disease, stage 3   Hypokalemia    Time spent: 30 min    Kollyns Mickelson  [facility]  Pager [phone]. If 7PM-7AM, please contact night-coverage at [email or web address], password [email or web address] 08/28/2013, 10:01 AM  LOS: 4 days

## 2013-08-29 MED ORDER — STROKE: EARLY STAGES OF RECOVERY BOOK
Freq: Once | Status: AC
  Administered 2013-08-29: 12:00:00
  Filled 2013-08-29: qty 1

## 2013-08-29 MED ORDER — ASPIRIN EC 325 MG PO TBEC: 325.0000 mg | DELAYED_RELEASE_TABLET | Freq: Every day | ORAL | Status: DC

## 2013-08-29 NOTE — Progress Notes (Signed)
[facility] PROGRESS NOTE  Bonnie [name] UJW:[id number] DOB: [birth year] DOA: 08/24/2013 PCP: Cassell Smiles., MD  Assessment/Plan  Metabolic encephalopathy, likely due to hospital acquired delirium in setting of underlying progressive dementia. May also have some medication side effect from benzodiazepines and antibiotics, and some dehydration.  Ammonia level wnl. Urine culture was negative so her empiric ceftriaxone was discontinued. Unable to complete MRI due to patient cooperation. CT of the head demonstrated atrophy with small vessel disease an d prior small lacunar infarct in left cerebellum which may be related to high blood pressure. No acute infarcts. ECHO demonstrated preserved ejection fracture, grade 1 diastolic dysfunction. Telemetry did not demonstrate any atrial fibrillation. Cholesterol panel as below. Did not start statin as this may sometimes cause further memory problems. Consider outpatient carotid duplex if concerns about further strokes or progression. She was started on low dose norvasc. Avoided BB due to heart block, ACEI due to recent AKI, diuretics due to unpredictable oral intake.   Dementia, worsening. TSH 0.467, B12 527, RPR nonreactive. Minimize sedating medications if possible. She was placed on fall precautions and did not experience any falls. Avoided haldol, seroquel and other related antipsychotic medications secondary to prolonged QTc.  EKG demonstrated RBBB with bifascicular block, similar to previous. Prolonged QTc of > .  Were unfortunately only able to rely on benzodiazepines for sedation secondary to agitation.   Acute on chronic renal insufficiency. Resolved with IVF.   Dehydration resolved with fluids.   Hypertension/hyperlipidemia, blood pressure mildly elevated. Trend BP. Started low dose norvasc and transitioned simvastatin to equivalent dose of pravastatin at discharge due to drug drug interaction.   GERD, stable.  Adrenal insufficiency,  continue steroids.  Chronic diarrhea and constipation. Continue home medications.  Depression and anxiety, stable. Continue Cymbalta.  History of breast cancer, currently in remission.  Hypokalemia, likely secondary to diuretic use. Resolved with oral supplementation and discontinuation of HCTZ.  Patient apparently goes several days without eating or drinking intermittently at home. At risk for moderate to severe protein calorie malnutrition. Regular diet with supplements   PPD NEGATIVE at 72 hours.    -  No change in plan today, still awaiting family to pick up patient for home or to okay transfer to ALF with dementia unit today.    Diet: Regular  Access: PIV  IVF: OFF Proph: Lovenox   Code Status: DO NOT RESUSCITATE  Family Communication:  None Disposition Plan:  See above.   Consultants:  None  Procedures:  CT head   Echo  Carotid duplex  Antibiotics:  Ceftriaxone 9/24 >> 9.26  Was on cipro and then bactrim as outpatient prior to admission   HPI/Subjective:  Patient pleasantly confused today and feeling well.  No stool incontinence.    Objective: Filed Vitals:   08/28/13 0601 08/28/13 1519 08/28/13 2215 08/29/13 0500  BP: 123/75 143/71 138/82 143/66  Pulse: 83 108 86 82  Temp: 98.3 F (36.8 C) 98 F (36.7 C) 98.1 F (36.7 C) 98.4 F (36.9 C)  TempSrc: Oral     Resp: 18 18 18 18   Height:      Weight:      SpO2: 96% 95% 96% 97%    Intake/Output Summary (Last 24 hours) at 08/29/13 0952 Last data filed at 08/28/13 1200  Gross per 24 hour  Intake    100 ml  Output      0 ml  Net    100 ml   Filed Weights   08/24/13 1057 08/24/13 1654  Weight: 58.968 kg (130 lb) 58.6 kg (129 lb 3 oz)    Exam:   General:  Caucasian female, No acute distress, awake, pleasant and disoriented  HEENT:  NCAT, MMM  Cardiovascular:  RRR, nl S1, S2 no mrg, 2+ pulses, warm extremities  Respiratory:  CTAB, no increased WOB  Abdomen:   NABS, soft, NT/ND  MSK:    Normal tone and bulk, trace LEE  Neuro:  Grossly moves all extremities  Psych:  Oriented to person but not place or time this morning.    Data Reviewed: Basic Metabolic Panel:  Recent Labs Lab 08/24/13 1224 08/25/13 1040  NA 136 138  K 3.3* 3.5  CL 97 102  CO2 31 23  GLUCOSE 101* 125*  BUN 20 15  CREATININE 1.32* 1.01  CALCIUM 10.3 9.6   Liver Function Tests: No results found for this basename: AST, ALT, ALKPHOS, BILITOT, PROT, ALBUMIN,  in the last 168 hours No results found for this basename: LIPASE, AMYLASE,  in the last 168 hours  Recent Labs Lab 08/25/13 0456  AMMONIA 26   CBC:  Recent Labs Lab 08/24/13 1224  WBC 5.7  NEUTROABS 4.0  HGB 12.8  HCT 38.1  MCV 90.3  PLT 403*   Cardiac Enzymes: No results found for this basename: CKTOTAL, CKMB, CKMBINDEX, TROPONINI,  in the last 168 hours BNP (last 3 results) No results found for this basename: PROBNP,  in the last 8760 hours CBG: No results found for this basename: GLUCAP,  in the last 168 hours  Recent Results (from the past 240 hour(s))  URINE CULTURE     Status: None   Collection Time    08/24/13 11:55 AM      Result Value Range Status   Specimen Description URINE, CLEAN CATCH   Final   Special Requests NONE   Final   Culture  Setup Time     Final   Value: 08/25/2013 01:44     Performed at [facility] Count     Final   Value: NO GROWTH     Performed at [facility]   Culture     Final   Value: NO GROWTH     Performed at [facility]   Report Status 08/26/2013 FINAL   Final     Studies: No results found.  Scheduled Meds: . amLODipine  2.5 mg Oral Daily  . atorvastatin  10 mg Oral q1800  . DULoxetine  120 mg Oral Daily  . feeding supplement  237 mL Oral BID BM  . heparin subcutaneous  5,000 Units Subcutaneous Q8H  . hydrocortisone  10 mg Oral Daily  . LORazepam  2 mg Intravenous Once  . loteprednol  1 drop Both Eyes QID  . lubiprostone  24 mcg Oral Q  breakfast  . multivitamin with minerals  1 tablet Oral Daily   Continuous Infusions:    Principal Problem:   Metabolic encephalopathy Active Problems:   Diarrhea   Memory disorder   Hypoadrenalism   Hypertension   Hyperlipidemia   Constipation   UTI (lower urinary tract infection)   Dementia   Dehydration   Acute on chronic kidney disease, stage 3   Hypokalemia    Time spent: 30 min    Ho Parisi  [facility] Pager [phone]. If 7PM-7AM, please contact night-coverage at [email or web address], password [facility] 08/29/2013, 9:52 AM  LOS: 5 days

## 2013-08-29 NOTE — Progress Notes (Signed)
UR chart review completed.  

## 2013-08-29 NOTE — Plan of Care (Signed)
Problem: Discharge/Transitional Outcomes Goal: Independent mobility/functioning independent or with min Independent mobility/functioning independently or with minimal assistance Outcome: Completed/Met Date Met:  08/29/13 Ambulating in hallway without difficulty,x's one assist.

## 2013-08-29 NOTE — Progress Notes (Signed)
Patient discharged home today with instructions given on discharge medications and follow up visits,educational materials given on mapping your way to recovery from a Stroke,the medications,modifiable risk factors for stroke,warning signs,and when to call 911,family verbalized understanding.Prescriptions are to be picked up by family form Pharmacy of choice documented on AVS. Vital signs stable.Accompanied by staff to an awaiting vehicle.

## 2013-08-29 NOTE — Clinical Social Work Note (Signed)
CSW read notes from staff over weekend indicating that they could not get in touch with family. Left voicemail for daughter-in-law as son's phone did not take voicemails. Shared that we were concerned about inability to reach family and that if needed police would be contacted for a welfare check. Family showed up within an hour. CSW met with Bonnie [name], pt's son in hallway this morning. Expressed concern that staff were unable to reach family over weekend and there were no visits. Bonnie [name] said they were having "phone issues" and that it must be "lack of communication." When asked about why no one came to visit, he asked what day it was today and was surprised to hear that she was actually d/c on Saturday. [facility] came to evaluate again today and may have a bed soon for pt. Family continue to want to take pt home until she can transfer to [facility]. Bonnie [name] has gone to PCP's office to drop off FL2 for Dr. Sherwood Gambler to complete. Sons plan to continue around the clock supervision. Bonnie [name] states, "We will do that unless there is an emergency, but you don't have to worry about it if there is as we won't be bringing her back here." CSW will sign off.  [name], [place] [phone]

## 2013-08-29 NOTE — Plan of Care (Signed)
Problem: Discharge Progression Outcomes Goal: Activity appropriate for discharge plan Outcome: Completed/Met Date Met:  08/29/13 Patient ambulated in hallway today without difficulty.

## 2013-09-08 ENCOUNTER — Encounter (HOSPITAL_COMMUNITY): Payer: Self-pay | Admitting: Emergency Medicine

## 2013-09-08 ENCOUNTER — Emergency Department (HOSPITAL_COMMUNITY): Payer: Medicare Other

## 2013-09-08 ENCOUNTER — Emergency Department (HOSPITAL_COMMUNITY)
Admission: EM | Admit: 2013-09-08 | Discharge: 2013-09-08 | Disposition: A | Discharge status: Home / Self Care | Payer: Medicare Other | Attending: Emergency Medicine | Admitting: Emergency Medicine

## 2013-09-08 DIAGNOSIS — F411 Generalized anxiety disorder: Secondary | ICD-10-CM | POA: Insufficient documentation

## 2013-09-08 DIAGNOSIS — M1990 Unspecified osteoarthritis, unspecified site: Secondary | ICD-10-CM | POA: Insufficient documentation

## 2013-09-08 DIAGNOSIS — Z853 Personal history of malignant neoplasm of breast: Secondary | ICD-10-CM | POA: Insufficient documentation

## 2013-09-08 DIAGNOSIS — Z87891 Personal history of nicotine dependence: Secondary | ICD-10-CM | POA: Insufficient documentation

## 2013-09-08 DIAGNOSIS — Z923 Personal history of irradiation: Secondary | ICD-10-CM | POA: Insufficient documentation

## 2013-09-08 DIAGNOSIS — Z87440 Personal history of urinary (tract) infections: Secondary | ICD-10-CM | POA: Insufficient documentation

## 2013-09-08 DIAGNOSIS — I10 Essential (primary) hypertension: Secondary | ICD-10-CM | POA: Insufficient documentation

## 2013-09-08 DIAGNOSIS — Z8669 Personal history of other diseases of the nervous system and sense organs: Secondary | ICD-10-CM | POA: Insufficient documentation

## 2013-09-08 DIAGNOSIS — E876 Hypokalemia: Secondary | ICD-10-CM | POA: Insufficient documentation

## 2013-09-08 DIAGNOSIS — F0390 Unspecified dementia without behavioral disturbance: Secondary | ICD-10-CM | POA: Insufficient documentation

## 2013-09-08 DIAGNOSIS — Z8719 Personal history of other diseases of the digestive system: Secondary | ICD-10-CM | POA: Insufficient documentation

## 2013-09-08 DIAGNOSIS — Z79899 Other long term (current) drug therapy: Secondary | ICD-10-CM | POA: Insufficient documentation

## 2013-09-08 DIAGNOSIS — IMO0002 Reserved for concepts with insufficient information to code with codable children: Secondary | ICD-10-CM | POA: Insufficient documentation

## 2013-09-08 DIAGNOSIS — Z7982 Long term (current) use of aspirin: Secondary | ICD-10-CM | POA: Insufficient documentation

## 2013-09-08 DIAGNOSIS — E785 Hyperlipidemia, unspecified: Secondary | ICD-10-CM | POA: Insufficient documentation

## 2013-09-08 DIAGNOSIS — Z8781 Personal history of (healed) traumatic fracture: Secondary | ICD-10-CM | POA: Insufficient documentation

## 2013-09-08 DIAGNOSIS — Z8742 Personal history of other diseases of the female genital tract: Secondary | ICD-10-CM | POA: Insufficient documentation

## 2013-09-08 LAB — CBC WITH DIFFERENTIAL/PLATELET
Basophils Absolute: 0.1 10*3/uL (ref 0.0–0.1)
Basophils Relative: 1 % (ref 0–1)
HCT: 37.1 % (ref 36.0–46.0)
Lymphocytes Relative: 22 % (ref 12–46)
Lymphs Abs: 1.2 10*3/uL (ref 0.7–4.0)
Monocytes Absolute: 0.6 10*3/uL (ref 0.1–1.0)
Neutro Abs: 3.4 10*3/uL (ref 1.7–7.7)
Neutrophils Relative %: 65 % (ref 43–77)
Platelets: 409 10*3/uL — ABNORMAL HIGH (ref 150–400)
RDW: 13 % (ref 11.5–15.5)
WBC: 5.3 10*3/uL (ref 4.0–10.5)

## 2013-09-08 LAB — URINALYSIS W MICROSCOPIC + REFLEX CULTURE
Bilirubin Urine: NEGATIVE
Glucose, UA: NEGATIVE mg/dL
Specific Gravity, Urine: 1.02 (ref 1.005–1.030)
Urobilinogen, UA: 0.2 mg/dL (ref 0.0–1.0)
pH: 5.5 (ref 5.0–8.0)

## 2013-09-08 LAB — BASIC METABOLIC PANEL
CO2: 23 mEq/L (ref 19–32)
Chloride: 101 mEq/L (ref 96–112)
GFR calc Af Amer: 71 mL/min — ABNORMAL LOW (ref >90)
Potassium: 3 mEq/L — ABNORMAL LOW (ref 3.5–5.1)
Sodium: 138 mEq/L (ref 135–145)

## 2013-09-08 LAB — LACTIC ACID, PLASMA: Lactic Acid, Venous: 2.2 mmol/L (ref 0.5–2.2)

## 2013-09-08 MED ORDER — POTASSIUM CHLORIDE 20 MEQ/15ML (10%) PO LIQD
40.0000 meq | Freq: Once | ORAL | Status: AC
  Administered 2013-09-08: 40 meq via ORAL
  Filled 2013-09-08: qty 30

## 2013-09-08 NOTE — ED Notes (Signed)
EMS states that patient has been diagnosed with Dementia and had an outburst today and told her son and a female relative to leave her alone. States that her son wants her admitted and that he states she needs to go ahead and be placed into a home. Patient states that there was an argument between her and the female that was rather heated and that someone called 911. Patient states that she has cancer and she was tearful during triage. Patient states that she thinks her son is mad at her and that he would disown her at any time.

## 2013-09-08 NOTE — ED Notes (Signed)
Patient ambulated around the nurses station and walked backwards to show me she could. Patient has no complaints at this time. Just wants to go home.

## 2013-09-08 NOTE — ED Notes (Signed)
Assisted patient to restroom. Gait is steady with hands on.

## 2013-09-08 NOTE — ED Provider Notes (Signed)
CSN: [id number]     Arrival date & time 09/08/13  1940 History   First MD Initiated Contact with Patient 09/08/13 2005     Chief Complaint  Patient presents with  . Dementia    The history is provided by a relative and a caregiver. The history is limited by the condition of the patient (Hx dementia).  Pt was seen at 2015. Per pt's family and pt, c/o sudden onset and resolution of one episode of "agitation" that occurred tonight PTA. Pt's family states pt became agitated and argumentative with caregiver and family this evening. Pt states they were having an "heated argument." Family states they have home health care and are in the process of making arrangements to admit pt to a nursing home. Son states they brought her to the ED tonight because they "didn't know what to do with her when she got like that," as well as he is concerned regarding "a UTI or a stroke." States he "forgot" pt had been prescribed ativan to take as needed for agitation after her last hospital admission. Pt herself denies any complaints and states she "just feels like my son is mad at me and wants to disown me."     Past Medical History  Diagnosis Date  . GERD (gastroesophageal reflux disease)   . Colitis, ischemic   . Osteoarthritis   . Breast cancer     Right Breast - May 2013/ Left Breast - 2003  . Anxiety   . Stress incontinence, female   . Constipation - functional   . Hypertension   . Headache(784.0)   . Hx: UTI (urinary tract infection)   . HOH (hard of hearing)     both ears; refuses to wear hearing aides  . Dementia   . Benign essential tremor     left hand; intermittent  . Hyperlipidemia   . Adrenal gland hypofunction     takes cortisone daily  . S/P radiation therapy 04/15/12 -05/13/12    RIGHT BREAST  . Chronic constipation 01/04/13  . Fatigue 02/25/13    chronic  . Collar bone fracture    Past Surgical History  Procedure Laterality Date  . Tonsillectomy      At age 35  . Lumbar laminectomy     . Abdominal hysterectomy    . Bunionectomy      bilateral  . Rotor cuff      Right Shoulder  . Upper gastrointestinal endoscopy    . Kidney stone    . Knee surgery  1987    L  . Foot surgery      left foot 1994, right foot 1995  . Lumpectomy  2003  . Eye surgery  2005  . Back surgery    . Breast surgery  2003    left breast  . Tonsillectomy    . Breast surgery  2013   Family History  Problem Relation Age of Onset  . Heart disease Brother   . Alcohol abuse Brother   . Alcohol abuse Brother   . Healthy Son   . Anesthesia problems Neg Hx   . Heart disease Mother   . Heart disease Father   . Dementia Mother    History  Substance Use Topics  . Smoking status: Former Smoker -- 0.50 packs/day for 20 years    Types: Cigarettes    Quit date: 09/21/1996  . Smokeless tobacco: Never Used     Comment: smoked to 3-4 cigartettes a day  . Alcohol Use:  Yes     Comment: Occasional   OB History   Grav Para Term Preterm Abortions TAB SAB Ect Mult Living   2 2 2       2      Review of Systems  Unable to perform ROS: Dementia    Allergies  Macrodantin  Home Medications   Current Outpatient Rx  Name  Route  Sig  Dispense  Refill  . aspirin EC 325 MG tablet   Oral   Take 1 tablet (325 mg total) by mouth daily.   30 tablet   0   . cyanocobalamin (,VITAMIN B-12,) 1000 MCG/ML injection   Intramuscular   Inject 1,000 mcg into the muscle every 30 (thirty) days.          . feeding supplement (ENSURE COMPLETE) LIQD   Oral   Take 237 mLs by mouth 2 (two) times daily between meals.   60 Bottle   0   . LORazepam (ATIVAN) 2 MG tablet   Oral   Take 1 tablet (2 mg total) by mouth every 6 (six) hours as needed for anxiety (agitation or sleep).   30 tablet   0   . loteprednol (LOTEMAX) 0.5 % ophthalmic suspension   Both Eyes   Place 1 drop into both eyes 4 (four) times daily.          . Multiple Vitamins-Minerals (CENTRUM SILVER ADULT 50+ PO)   Oral   Take 1 tablet  by mouth daily.          Marland Kitchen amLODipine (NORVASC) 2.5 MG tablet   Oral   Take 1 tablet (2.5 mg total) by mouth daily.   30 tablet   0   . docusate sodium (COLACE) 100 MG capsule   Oral   Take 100 mg by mouth at bedtime as needed for constipation.         . DULoxetine (CYMBALTA) 60 MG capsule   Oral   Take 120 mg by mouth daily.          . hydrocortisone (CORTEF) 20 MG tablet   Oral   Take 10 mg by mouth daily.          Marland Kitchen lubiprostone (AMITIZA) 24 MCG capsule   Oral   Take 24 mcg by mouth daily with breakfast.          . pravastatin (PRAVACHOL) 80 MG tablet   Oral   Take 1 tablet (80 mg total) by mouth daily.   30 tablet   0   . Wheat Dextrin (BENEFIBER PO)   Oral   Take by mouth daily as needed (constipation).          BP 157/85  Pulse 101  Temp(Src) 97.8 F (36.6 C) (Oral)  Resp 24  Ht 5\' 2"  (1.575 m)  Wt 115 lb (52.164 kg)  BMI 21.03 kg/m2  SpO2 95% Physical Exam 2020: Physical examination:  Nursing notes reviewed; Vital signs and O2 SAT reviewed;  Constitutional: Well developed, Well nourished, Well hydrated, In no acute distress; Head:  Normocephalic, atraumatic; Eyes: EOMI, PERRL, No scleral icterus; ENMT: Mouth and pharynx normal, Mucous membranes moist; Neck: Supple, Full range of motion, No lymphadenopathy; Cardiovascular: Regular rate and rhythm, No gallop; Respiratory: Breath sounds clear & equal bilaterally, No wheezes.  Speaking full sentences with ease, Normal respiratory effort/excursion; Chest: Nontender, Movement normal; Abdomen: Soft, Nontender, Nondistended, Normal bowel sounds; Genitourinary: No CVA tenderness; Extremities: Pulses normal, No tenderness, No edema, No calf edema or asymmetry.; Neuro: Awake,  alert, mildly confused regarding time. Major CN grossly intact. No facial droop. Speech clear. Grips equal. Strength 5/5 equal bilat UE's and LE's. No apparent gross focal motor deficits in extremities.; Skin: Color normal, Warm,  Dry.   ED Course  Procedures     MDM  MDM Reviewed: previous chart, nursing note and vitals Reviewed previous: labs Interpretation: labs, x-ray and CT scan   Results for orders placed during the hospital encounter of 09/08/13  URINALYSIS W MICROSCOPIC + REFLEX CULTURE      Result Value Range   Color, Urine YELLOW  YELLOW   APPearance CLEAR  CLEAR   Specific Gravity, Urine 1.020  1.005 - 1.030   pH 5.5  5.0 - 8.0   Glucose, UA NEGATIVE  NEGATIVE mg/dL   Hgb urine dipstick TRACE (*) NEGATIVE   Bilirubin Urine NEGATIVE  NEGATIVE   Ketones, ur NEGATIVE  NEGATIVE mg/dL   Protein, ur NEGATIVE  NEGATIVE mg/dL   Urobilinogen, UA 0.2  0.0 - 1.0 mg/dL   Nitrite NEGATIVE  NEGATIVE   Leukocytes, UA NEGATIVE  NEGATIVE   WBC, UA 0-2  <3 WBC/hpf   RBC / HPF 3-6  <3 RBC/hpf   Bacteria, UA RARE  RARE   Squamous Epithelial / LPF RARE  RARE   Crystals CA OXALATE CRYSTALS (*) NEGATIVE  CBC WITH DIFFERENTIAL      Result Value Range   WBC 5.3  4.0 - 10.5 K/uL   RBC 4.09  3.87 - 5.11 MIL/uL   Hemoglobin 12.5  12.0 - 15.0 g/dL   HCT 16.1  09.6 - 04.5 %   MCV 90.7  78.0 - 100.0 fL   MCH 30.6  26.0 - 34.0 pg   MCHC 33.7  30.0 - 36.0 g/dL   RDW 40.9  81.1 - 91.4 %   Platelets 409 (*) 150 - 400 K/uL   Neutrophils Relative % 65  43 - 77 %   Neutro Abs 3.4  1.7 - 7.7 K/uL   Lymphocytes Relative 22  12 - 46 %   Lymphs Abs 1.2  0.7 - 4.0 K/uL   Monocytes Relative 11  3 - 12 %   Monocytes Absolute 0.6  0.1 - 1.0 K/uL   Eosinophils Relative 2  0 - 5 %   Eosinophils Absolute 0.1  0.0 - 0.7 K/uL   Basophils Relative 1  0 - 1 %   Basophils Absolute 0.1  0.0 - 0.1 K/uL  BASIC METABOLIC PANEL      Result Value Range   Sodium 138  135 - 145 mEq/L   Potassium 3.0 (*) 3.5 - 5.1 mEq/L   Chloride 101  96 - 112 mEq/L   CO2 23  19 - 32 mEq/L   Glucose, Bld 107 (*) 70 - 99 mg/dL   BUN 16  6 - 23 mg/dL   Creatinine, Ser 7.82  0.50 - 1.10 mg/dL   Calcium 9.8  8.4 - 95.6 mg/dL   GFR calc non Af Amer  61 (*) >90 mL/min   GFR calc Af Amer 71 (*) >90 mL/min  LACTIC ACID, PLASMA      Result Value Range   Lactic Acid, Venous 2.2  0.5 - 2.2 mmol/L   Dg Chest 2 View 09/08/2013   *RADIOLOGY REPORT*  Clinical Data: Dementia.  CHEST - 2 VIEW  Comparison: Chest radiograph performed 08/09/2013  Findings: The lungs are hyperexpanded, with flattening of the hemidiaphragms, compatible with COPD.  There is no evidence of focal opacification,  pleural effusion or pneumothorax.  The heart is normal in size; the mediastinal contour is within normal limits.  No acute osseous abnormalities are seen.  The previously noted right clavicular fracture is less well characterized on the current study.  Clips are noted overlying the right breast.  IMPRESSION: Findings of COPD; no acute cardiopulmonary process identified.   Original Report Authenticated By: Tonia Ghent, M.D.   Ct Head Wo Contrast 09/08/2013   *RADIOLOGY REPORT*  Clinical Data: Dementia; history of breast cancer.  CT HEAD WITHOUT CONTRAST  Technique:  Contiguous axial images were obtained from the base of the skull through the vertex without contrast.  Comparison: CT of the head performed 08/26/2013  Findings: There is no evidence of acute infarction, mass lesion, or intra- or extra-axial hemorrhage on CT.  Prominence of the ventricles and sulci reflects mild cortical volume loss.  Cerebellar atrophy is noted.  Scattered periventricular and subcortical white matter change likely reflects small vessel ischemic microangiopathy.  A small chronic lacunar infarct is noted at the left cerebellar hemisphere.  The brainstem and fourth ventricle are within normal limits.  The basal ganglia are unremarkable in appearance.  The cerebral hemispheres demonstrate grossly normal gray-white differentiation. No mass effect or midline shift is seen.  There is no evidence of fracture; visualized osseous structures are unremarkable in appearance.  The visualized portions of the orbits  are within normal limits.  The paranasal sinuses and mastoid air cells are well-aerated.  No significant soft tissue abnormalities are seen.  IMPRESSION:  1.  No acute intracranial pathology seen on CT. 2.  Mild cortical volume loss and scattered small vessel ischemic microangiopathy. 3.  Mild cerebellar atrophy; small chronic lacunar infarct at the left cerebellar hemisphere.   Original Report Authenticated By: Tonia Ghent, M.D.     2315:  Pt has tol PO well while in the ED without N/V. Potassium repleted PO. Pt does not have any symptoms of orthostasis when VS were checked. Pt has climbed off the stretcher and ambulated in the ED multiple times with steady gait, easy resps. Denies CP/SOB. Pt jokingly stated to staff "see watch this" and proceeded to walk backwards with steady unassisted gait. Pt has been smiling, pleasant, cooperative and very talkative with staff and family while she has been in the ED. States she "feels fine" and wants to go home now.  Family insistent pt be admitted to the hospital "and go to a nursing home." Lengthy d/w pt's son regarding dx of dementia and that there is no criteria for admission to the hospital at this time; he would need to coordinate admission to a nursing home with her PMD as an outpatient. Son verb understanding. He continues to endorse the family does have Home Health that comes in daily and assists with pt's care. Son reminded to give pt her rx ativan as needed for agitation. Again verb understanding. At this time, pt has climbed off the stretcher on her own and gotten herself dressed, stating "I want to go home now and I'll call a cab if he's not going to drive me." Son states he will take his mother home now and contact PMD tomorrow.  Dx and testing d/w pt and family.  Questions answered.  Verb understanding, agreeable to d/c home with outpt f/u.       Laray Anger, DO 09/11/13 1122

## 2013-09-12 ENCOUNTER — Ambulatory Visit (HOSPITAL_COMMUNITY)
Admission: RE | Admit: 2013-09-12 | Discharge: 2013-09-12 | Disposition: A | Discharge status: Home / Self Care | Payer: Medicare Other | Source: Ambulatory Visit | Attending: Internal Medicine | Admitting: Internal Medicine

## 2013-09-12 ENCOUNTER — Other Ambulatory Visit (HOSPITAL_COMMUNITY): Payer: Self-pay | Admitting: Internal Medicine

## 2013-09-12 DIAGNOSIS — S298XXA Other specified injuries of thorax, initial encounter: Secondary | ICD-10-CM

## 2013-09-12 DIAGNOSIS — R079 Chest pain, unspecified: Secondary | ICD-10-CM | POA: Insufficient documentation

## 2013-09-12 DIAGNOSIS — Z4789 Encounter for other orthopedic aftercare: Secondary | ICD-10-CM | POA: Insufficient documentation

## 2013-10-04 ENCOUNTER — Ambulatory Visit (HOSPITAL_COMMUNITY): Payer: Medicare Other

## 2013-10-24 ENCOUNTER — Emergency Department (HOSPITAL_COMMUNITY)
Admission: EM | Admit: 2013-10-24 | Discharge: 2013-10-24 | Disposition: A | Discharge status: Home / Self Care | Payer: Medicare Other | Attending: Emergency Medicine | Admitting: Emergency Medicine

## 2013-10-24 ENCOUNTER — Encounter (HOSPITAL_COMMUNITY): Payer: Self-pay | Admitting: Emergency Medicine

## 2013-10-24 DIAGNOSIS — F329 Major depressive disorder, single episode, unspecified: Secondary | ICD-10-CM | POA: Insufficient documentation

## 2013-10-24 DIAGNOSIS — F0390 Unspecified dementia without behavioral disturbance: Secondary | ICD-10-CM | POA: Insufficient documentation

## 2013-10-24 DIAGNOSIS — Z853 Personal history of malignant neoplasm of breast: Secondary | ICD-10-CM | POA: Insufficient documentation

## 2013-10-24 DIAGNOSIS — F3289 Other specified depressive episodes: Secondary | ICD-10-CM | POA: Insufficient documentation

## 2013-10-24 DIAGNOSIS — E2749 Other adrenocortical insufficiency: Secondary | ICD-10-CM | POA: Insufficient documentation

## 2013-10-24 DIAGNOSIS — IMO0002 Reserved for concepts with insufficient information to code with codable children: Secondary | ICD-10-CM | POA: Insufficient documentation

## 2013-10-24 DIAGNOSIS — K5909 Other constipation: Secondary | ICD-10-CM | POA: Insufficient documentation

## 2013-10-24 DIAGNOSIS — H9190 Unspecified hearing loss, unspecified ear: Secondary | ICD-10-CM | POA: Insufficient documentation

## 2013-10-24 DIAGNOSIS — I10 Essential (primary) hypertension: Secondary | ICD-10-CM | POA: Insufficient documentation

## 2013-10-24 DIAGNOSIS — R51 Headache: Secondary | ICD-10-CM | POA: Insufficient documentation

## 2013-10-24 DIAGNOSIS — Z87448 Personal history of other diseases of urinary system: Secondary | ICD-10-CM | POA: Insufficient documentation

## 2013-10-24 DIAGNOSIS — M1990 Unspecified osteoarthritis, unspecified site: Secondary | ICD-10-CM | POA: Insufficient documentation

## 2013-10-24 DIAGNOSIS — Z8669 Personal history of other diseases of the nervous system and sense organs: Secondary | ICD-10-CM | POA: Insufficient documentation

## 2013-10-24 DIAGNOSIS — R519 Headache, unspecified: Secondary | ICD-10-CM

## 2013-10-24 DIAGNOSIS — Z79899 Other long term (current) drug therapy: Secondary | ICD-10-CM | POA: Insufficient documentation

## 2013-10-24 DIAGNOSIS — F411 Generalized anxiety disorder: Secondary | ICD-10-CM | POA: Insufficient documentation

## 2013-10-24 DIAGNOSIS — Z923 Personal history of irradiation: Secondary | ICD-10-CM | POA: Insufficient documentation

## 2013-10-24 DIAGNOSIS — Z8781 Personal history of (healed) traumatic fracture: Secondary | ICD-10-CM | POA: Insufficient documentation

## 2013-10-24 DIAGNOSIS — Z87891 Personal history of nicotine dependence: Secondary | ICD-10-CM | POA: Insufficient documentation

## 2013-10-24 DIAGNOSIS — Z87440 Personal history of urinary (tract) infections: Secondary | ICD-10-CM | POA: Insufficient documentation

## 2013-10-24 DIAGNOSIS — Z7982 Long term (current) use of aspirin: Secondary | ICD-10-CM | POA: Insufficient documentation

## 2013-10-24 HISTORY — DX: Other adrenocortical insufficiency: E27.49

## 2013-10-24 HISTORY — DX: Depression, unspecified: F32.A

## 2013-10-24 HISTORY — DX: Major depressive disorder, single episode, unspecified: F32.9

## 2013-10-24 MED ORDER — LORAZEPAM 2 MG/ML IJ SOLN
0.5000 mg | Freq: Once | INTRAMUSCULAR | Status: AC
  Administered 2013-10-24: 0.5 mg via INTRAMUSCULAR
  Filled 2013-10-24: qty 1

## 2013-10-24 MED ORDER — IBUPROFEN 800 MG PO TABS
800.0000 mg | ORAL_TABLET | Freq: Once | ORAL | Status: AC
  Administered 2013-10-24: 800 mg via ORAL
  Filled 2013-10-24: qty 1

## 2013-10-24 NOTE — ED Notes (Signed)
Dr. Estell Harpin notified of pt c/o pain; order rec'd for ibuprofen 800mg 

## 2013-10-24 NOTE — ED Notes (Signed)
edp requests patient return to Nursing home via ambulance . States illness is due to patients recent admission to facility

## 2013-10-24 NOTE — ED Notes (Signed)
Per pt and EMS, staff at [facility] have been medicating pt with ibuprofen for HA pain; per staff and pt, this has alleviated her pain each time.

## 2013-10-24 NOTE — ED Notes (Signed)
Now c/o severe frontal HA

## 2013-10-24 NOTE — ED Provider Notes (Signed)
CSN: [id number]     Arrival date & time 10/24/13  1129 History  This chart was scribed for Bonnie Melter, MD by [name], ED Scribe. This patient was seen in room APA18/APA18 and the patient's care was started at 1:13 PM.   Chief Complaint  Patient presents with  . Headache   Level 5 Caveat-Dementia  The history is provided by the patient. The history is limited by the condition of the patient (Hx of dementia ). No language interpreter was used.    HPI Comments: Bonnie [name] is a 79 y.o. female who presents to the Emergency Department from [facility] by ambulance complaining of chronic frontally located HAs that start every morning upon waking. Per SNF, pt has been medicated with ibuprofen with improvement in her HAs each time. She was sent here by Dr. Sherwood Gambler for further evaluation. Pt denies any sinus congestion, fever, neck pain, back pain or CP. She denies that she ate today but states that this is her usual routine. She is unable to answer any further questions due to the dementia.  Past Medical History  Diagnosis Date  . GERD (gastroesophageal reflux disease)   . Colitis, ischemic   . Osteoarthritis   . Breast cancer     Right Breast - May 2013/ Left Breast - 2003  . Anxiety   . Stress incontinence, female   . Constipation - functional   . Hypertension   . Headache(784.0)   . Hx: UTI (urinary tract infection)   . HOH (hard of hearing)     both ears; refuses to wear hearing aides  . Dementia   . Benign essential tremor     left hand; intermittent  . Hyperlipidemia   . Adrenal gland hypofunction     takes cortisone daily  . S/P radiation therapy 04/15/12 -05/13/12    RIGHT BREAST  . Chronic constipation 01/04/13  . Fatigue 02/25/13    chronic  . Collar bone fracture   . Depression   . Glucocorticoid deficiency    Past Surgical History  Procedure Laterality Date  . Tonsillectomy      At age 22  . Lumbar laminectomy    . Abdominal hysterectomy    .  Bunionectomy      bilateral  . Rotor cuff      Right Shoulder  . Upper gastrointestinal endoscopy    . Kidney stone    . Knee surgery  1987    L  . Foot surgery      left foot 1994, right foot 1995  . Lumpectomy  2003  . Eye surgery  2005  . Back surgery    . Breast surgery  2003    left breast  . Tonsillectomy    . Breast surgery  2013   Family History  Problem Relation Age of Onset  . Heart disease Brother   . Alcohol abuse Brother   . Alcohol abuse Brother   . Healthy Son   . Anesthesia problems Neg Hx   . Heart disease Mother   . Heart disease Father   . Dementia Mother    History  Substance Use Topics  . Smoking status: Former Smoker -- 0.50 packs/day for 20 years    Types: Cigarettes    Quit date: 09/21/1996  . Smokeless tobacco: Never Used     Comment: smoked to 3-4 cigartettes a day  . Alcohol Use: Yes     Comment: Occasional   OB History   Grav  Para Term Preterm Abortions TAB SAB Ect Mult Living   2 2 2       2      Review of Systems  Unable to perform ROS: Dementia    Allergies  Macrodantin  Home Medications   Current Outpatient Rx  Name  Route  Sig  Dispense  Refill  . ALPRAZolam (XANAX) 0.5 MG tablet   Oral   Take 0.5-1 mg by mouth 2 (two) times daily. One tablet in the morning and 2 at bedtime         . aspirin EC 81 MG tablet   Oral   Take 81 mg by mouth daily.         . Aspirin-Acetaminophen-Caffeine (GOODY HEADACHE PO)   Oral   Take 1 packet by mouth daily as needed (headaches).         . docusate sodium (COLACE) 100 MG capsule   Oral   Take 100 mg by mouth at bedtime as needed for constipation.         . DULoxetine (CYMBALTA) 30 MG capsule   Oral   Take 30 mg by mouth daily.         Marland Kitchen HYDROcodone-acetaminophen (NORCO) 10-325 MG per tablet   Oral   Take 1-2 tablets by mouth every 6 (six) hours as needed for moderate pain.         . hydrocortisone (CORTEF) 20 MG tablet   Oral   Take 10 mg by mouth daily.           Marland Kitchen loteprednol (LOTEMAX) 0.5 % ophthalmic suspension   Both Eyes   Place 1 drop into both eyes 4 (four) times daily.          Marland Kitchen lubiprostone (AMITIZA) 24 MCG capsule   Oral   Take 24 mcg by mouth daily with breakfast.          . Multiple Vitamins-Minerals (CENTRUM SILVER ADULT 50+ PO)   Oral   Take 1 tablet by mouth daily.          . pravastatin (PRAVACHOL) 80 MG tablet   Oral   Take 1 tablet (80 mg total) by mouth daily.   30 tablet   0   . psyllium (METAMUCIL) 58.6 % packet   Oral   Take 1 packet by mouth daily.          Triage Vitals: BP 167/87  Pulse 88  Temp(Src) 98.3 F (36.8 C) (Oral)  Resp 20  Ht 5\' 2"  (1.575 m)  Wt 115 lb (52.164 kg)  BMI 21.03 kg/m2  SpO2 96%  Physical Exam  Nursing note and vitals reviewed. Constitutional: She appears well-developed and well-nourished.  HENT:  Head: Normocephalic and atraumatic.  Eyes: Conjunctivae and EOM are normal. Pupils are equal, round, and reactive to light.  eccentric left pupil that is reactive. Normal and reactive right pupil   Neck: Normal range of motion and phonation normal. Neck supple.  Cardiovascular: Normal rate, regular rhythm and intact distal pulses.   Pulmonary/Chest: Effort normal and breath sounds normal. She exhibits no tenderness.  Abdominal: Soft. She exhibits no distension. There is no tenderness. There is no guarding.  Musculoskeletal: Normal range of motion.  Neurological: She is alert. She exhibits normal muscle tone.  Skin: Skin is warm and dry.  Psychiatric: Her behavior is normal. Judgment and thought content normal. Her mood appears anxious.    ED Course  Procedures (including critical care time)  Medications  ibuprofen (ADVIL,MOTRIN) tablet 800 mg (  800 mg Oral Given 10/24/13 1307)  LORazepam (ATIVAN) injection 0.5 mg (0.5 mg Intramuscular Given 10/24/13 1324)   Patient Vitals for the past 24 hrs:  BP Temp Temp src Pulse Resp SpO2 Height Weight  10/24/13 1513 - - - -  20 - - -  10/24/13 1511 157/91 mmHg 97.9 F (36.6 C) Oral 96 - - - -  10/24/13 1134 167/87 mmHg 98.3 F (36.8 C) Oral 88 20 96 % 5\' 2"  (1.575 m) 115 lb (52.164 kg)    DIAGNOSTIC STUDIES: Oxygen Saturation is 96% on room air, normal by my interpretation.    COORDINATION OF CARE: 1:16 PM-Will order Ativan and a second round of ibuprofen.   2:48 PM-Pt rechecked and feels improved with medications listed above. Had lengthy discussion with pt's family member as to why I did not order a CT scan of the head.  3:35 PM-Family now expresses concern over pt's c/o right collar bone pain from a clavicle fx. Pt has a palpable deformity to the right clavicle that has pulled her arm inward which is the most probable cause of pain. Family states that she was on hydrocodone but was taken off by SNF doctor. They state that they thought that she was put back on by her PCP. Increase Xanax to three times a day to improve the HAs.  The patient's MAR was obtained from her facility. This indicates that she is getting scheduled Xanax mornings and evenings and when necessary Xanax and hydrocodone.   Onions or discuss with the patient and her family members were with her in the emergency department. All questions were answered.   Nursing Notes Reviewed/ Care Coordinated Applicable Imaging Reviewed  Labs Review Labs Reviewed - No data to display Imaging Review No results found.  EKG Interpretation    Date/Time:  Monday October 24 2013 14:51:48 EST Ventricular Rate:  92 PR Interval:  206 QRS Duration: 128 QT Interval:  414 QTC Calculation: 511 R Axis:   -73 Text Interpretation:  Normal sinus rhythm Right bundle branch block Left anterior fascicular block Bifascicular Block Minimal voltage criteria for LVH, may be normal variant Abnormal ECG When compared with ECG of 24-Aug-2013 15:44, T wave inversion no longer evident in Inferior leads Reconfirmed by [facility]  MD, Geselle Cardosa ([id number]) on 10/24/2013 4:28:55  PM            MDM   1. Headache    The patient's EKG was checked to followup on her abnormal QT; to enable consideration of other medications for her discomfort. The QT interval is unchanged from prior. This means that medications that prolong QT cannot be used for her. Currently, she does not have any syncope. There is no need to evaluate the prolonged QT any further, today.  Nonspecific headache, without evidence for CVA, focal neurologic abnormality, meningitis, serious bacterial infection or metabolic instability. Patient's improved with symptomatic treatment and is stable for discharge.   Plan: Home Medications-pt to start tramadol; Home Treatments- increase xanax to three times daily, take tramadol for chronic pain, treat HAs with OTC medications PRN, return here if the recommended treatment, does not improve the symptoms; Recommended follow up- with PCP   Plan: Home Medications- usual; Home Treatments and Observation- rest; return here if the recommended treatment, does not improve the symptoms; Recommended follow up- PCP, when necessary     I personally performed the services described in this documentation, which was scribed in my presence. The recorded information has been reviewed and is accurate.  Bonnie Melter, MD 10/24/13 [phone]

## 2013-10-24 NOTE — ED Notes (Signed)
Pt c/o headache in left side of head and left eye watering x 1 1/2 weeks.  Reports was sent here by Dr. Sherwood Gambler for evaluation.

## 2013-10-24 NOTE — ED Notes (Signed)
Report given to [facility]

## 2013-10-24 NOTE — ED Notes (Signed)
Dr. Effie Shy at bedside to examine.

## 2013-11-26 ENCOUNTER — Encounter (HOSPITAL_COMMUNITY): Payer: Self-pay | Admitting: Emergency Medicine

## 2013-11-26 ENCOUNTER — Emergency Department (HOSPITAL_COMMUNITY)
Admission: EM | Admit: 2013-11-26 | Discharge: 2013-11-26 | Disposition: A | Discharge status: Skilled Nursing Facility | Payer: Medicare Other | Source: Home / Self Care | Attending: Emergency Medicine | Admitting: Emergency Medicine

## 2013-11-26 ENCOUNTER — Emergency Department (HOSPITAL_COMMUNITY): Payer: Medicare Other

## 2013-11-26 DIAGNOSIS — E785 Hyperlipidemia, unspecified: Secondary | ICD-10-CM | POA: Insufficient documentation

## 2013-11-26 DIAGNOSIS — M1990 Unspecified osteoarthritis, unspecified site: Secondary | ICD-10-CM | POA: Insufficient documentation

## 2013-11-26 DIAGNOSIS — Z9089 Acquired absence of other organs: Secondary | ICD-10-CM | POA: Insufficient documentation

## 2013-11-26 DIAGNOSIS — K5900 Constipation, unspecified: Secondary | ICD-10-CM | POA: Insufficient documentation

## 2013-11-26 DIAGNOSIS — J069 Acute upper respiratory infection, unspecified: Secondary | ICD-10-CM | POA: Insufficient documentation

## 2013-11-26 DIAGNOSIS — R509 Fever, unspecified: Secondary | ICD-10-CM | POA: Insufficient documentation

## 2013-11-26 DIAGNOSIS — IMO0002 Reserved for concepts with insufficient information to code with codable children: Secondary | ICD-10-CM | POA: Insufficient documentation

## 2013-11-26 DIAGNOSIS — Z79899 Other long term (current) drug therapy: Secondary | ICD-10-CM | POA: Insufficient documentation

## 2013-11-26 DIAGNOSIS — R0602 Shortness of breath: Secondary | ICD-10-CM | POA: Diagnosis not present

## 2013-11-26 DIAGNOSIS — I10 Essential (primary) hypertension: Secondary | ICD-10-CM | POA: Insufficient documentation

## 2013-11-26 DIAGNOSIS — Z923 Personal history of irradiation: Secondary | ICD-10-CM | POA: Insufficient documentation

## 2013-11-26 DIAGNOSIS — Z87891 Personal history of nicotine dependence: Secondary | ICD-10-CM | POA: Insufficient documentation

## 2013-11-26 DIAGNOSIS — F411 Generalized anxiety disorder: Secondary | ICD-10-CM | POA: Insufficient documentation

## 2013-11-26 DIAGNOSIS — F0390 Unspecified dementia without behavioral disturbance: Secondary | ICD-10-CM | POA: Insufficient documentation

## 2013-11-26 DIAGNOSIS — E2749 Other adrenocortical insufficiency: Secondary | ICD-10-CM | POA: Insufficient documentation

## 2013-11-26 DIAGNOSIS — Z87448 Personal history of other diseases of urinary system: Secondary | ICD-10-CM | POA: Insufficient documentation

## 2013-11-26 DIAGNOSIS — Z87440 Personal history of urinary (tract) infections: Secondary | ICD-10-CM | POA: Insufficient documentation

## 2013-11-26 DIAGNOSIS — Z853 Personal history of malignant neoplasm of breast: Secondary | ICD-10-CM | POA: Insufficient documentation

## 2013-11-26 DIAGNOSIS — F3289 Other specified depressive episodes: Secondary | ICD-10-CM | POA: Insufficient documentation

## 2013-11-26 DIAGNOSIS — F329 Major depressive disorder, single episode, unspecified: Secondary | ICD-10-CM | POA: Insufficient documentation

## 2013-11-26 DIAGNOSIS — A419 Sepsis, unspecified organism: Secondary | ICD-10-CM | POA: Diagnosis not present

## 2013-11-26 DIAGNOSIS — H9190 Unspecified hearing loss, unspecified ear: Secondary | ICD-10-CM | POA: Insufficient documentation

## 2013-11-26 DIAGNOSIS — Z7982 Long term (current) use of aspirin: Secondary | ICD-10-CM | POA: Insufficient documentation

## 2013-11-26 DIAGNOSIS — Z8781 Personal history of (healed) traumatic fracture: Secondary | ICD-10-CM | POA: Insufficient documentation

## 2013-11-26 MED ORDER — ACETAMINOPHEN 500 MG PO TABS
1000.0000 mg | ORAL_TABLET | Freq: Once | ORAL | Status: AC
  Administered 2013-11-26: 1000 mg via ORAL
  Filled 2013-11-26: qty 2

## 2013-11-26 NOTE — ED Provider Notes (Signed)
CSN: [id number]     Arrival date & time 11/26/13  1005 History   This chart was scribed for Donnetta Hutching, MD by Smiley Houseman, ED Scribe. The patient was seen in room APA10/APA10. Patient's care was started at 10:19 AM.    Chief Complaint  Patient presents with  . Cough   The history is provided by the patient. No language interpreter was used.   HPI Comments: Bonnie [name] is a 79 y.o. female who presents to the Emergency Department complaining of  constant worsening nonproductive cough that began a few days ago.  She denies any SOB and fever.  ED temperature is 99.69F.  Pt lives in a SNF where her roommate has had a cough recently.  Severity is mild. Nothing makes symptoms better or worse.        Past Medical History  Diagnosis Date  . GERD (gastroesophageal reflux disease)   . Colitis, ischemic   . Osteoarthritis   . Breast cancer     Right Breast - May 2013/ Left Breast - 2003  . Anxiety   . Stress incontinence, female   . Constipation - functional   . Hypertension   . Headache(784.0)   . Hx: UTI (urinary tract infection)   . HOH (hard of hearing)     both ears; refuses to wear hearing aides  . Dementia   . Benign essential tremor     left hand; intermittent  . Hyperlipidemia   . Adrenal gland hypofunction     takes cortisone daily  . S/P radiation therapy 04/15/12 -05/13/12    RIGHT BREAST  . Chronic constipation 01/04/13  . Fatigue 02/25/13    chronic  . Collar bone fracture   . Depression   . Glucocorticoid deficiency    Past Surgical History  Procedure Laterality Date  . Tonsillectomy      At age 33  . Lumbar laminectomy    . Abdominal hysterectomy    . Bunionectomy      bilateral  . Rotor cuff      Right Shoulder  . Upper gastrointestinal endoscopy    . Kidney stone    . Knee surgery  1987    L  . Foot surgery      left foot 1994, right foot 1995  . Lumpectomy  2003  . Eye surgery  2005  . Back surgery    . Breast surgery  2003    left breast  .  Tonsillectomy    . Breast surgery  2013   Family History  Problem Relation Age of Onset  . Heart disease Brother   . Alcohol abuse Brother   . Alcohol abuse Brother   . Healthy Son   . Anesthesia problems Neg Hx   . Heart disease Mother   . Heart disease Father   . Dementia Mother    History  Substance Use Topics  . Smoking status: Former Smoker -- 0.50 packs/day for 20 years    Types: Cigarettes    Quit date: 09/21/1996  . Smokeless tobacco: Never Used     Comment: smoked to 3-4 cigartettes a day  . Alcohol Use: Yes     Comment: Occasional   OB History   Grav Para Term Preterm Abortions TAB SAB Ect Mult Living   2 2 2       2      Review of Systems A complete 10 system review of systems was obtained and all systems are negative except as noted  in the HPI and PMH.   Allergies  Macrodantin  Home Medications   Current Outpatient Rx  Name  Route  Sig  Dispense  Refill  . ALPRAZolam (XANAX) 0.5 MG tablet   Oral   Take 0.5-1 mg by mouth 2 (two) times daily. One tablet in the morning and 2 at bedtime         . aspirin EC 81 MG tablet   Oral   Take 81 mg by mouth daily.         . Aspirin-Acetaminophen-Caffeine (GOODY HEADACHE PO)   Oral   Take 1 packet by mouth daily as needed (headaches).         . docusate sodium (COLACE) 100 MG capsule   Oral   Take 100 mg by mouth at bedtime as needed for constipation.         . DULoxetine (CYMBALTA) 30 MG capsule   Oral   Take 30 mg by mouth daily.         Marland Kitchen HYDROcodone-acetaminophen (NORCO) 10-325 MG per tablet   Oral   Take 1-2 tablets by mouth every 6 (six) hours as needed for moderate pain.         . hydrocortisone (CORTEF) 20 MG tablet   Oral   Take 10 mg by mouth daily.          Marland Kitchen loteprednol (LOTEMAX) 0.5 % ophthalmic suspension   Both Eyes   Place 1 drop into both eyes 4 (four) times daily.          Marland Kitchen lubiprostone (AMITIZA) 24 MCG capsule   Oral   Take 24 mcg by mouth daily with breakfast.           . Multiple Vitamins-Minerals (CENTRUM SILVER ADULT 50+ PO)   Oral   Take 1 tablet by mouth daily.          . pravastatin (PRAVACHOL) 80 MG tablet   Oral   Take 1 tablet (80 mg total) by mouth daily.   30 tablet   0   . psyllium (METAMUCIL) 58.6 % packet   Oral   Take 1 packet by mouth daily.          Triage Vitals: BP 116/65  Pulse 72  Temp(Src) 99.1 F (37.3 C) (Oral)  Resp 18  Ht 5\' 2"  (1.575 m)  Wt 110 lb (49.896 kg)  BMI 20.11 kg/m2  SpO2 95% Physical Exam  Nursing note and vitals reviewed. Constitutional: She is oriented to person, place, and time. She appears well-developed and well-nourished.  HENT:  Head: Normocephalic and atraumatic.  Eyes: Conjunctivae and EOM are normal. Pupils are equal, round, and reactive to light.  Neck: Normal range of motion. Neck supple.  Cardiovascular: Normal rate, regular rhythm and normal heart sounds.   Pulmonary/Chest: Effort normal and breath sounds normal.  Abdominal: Soft. Bowel sounds are normal.  Musculoskeletal: Normal range of motion.  Neurological: She is alert and oriented to person, place, and time.  Skin: Skin is warm and dry.  Psychiatric: She has a normal mood and affect. Her behavior is normal.    ED Course  Procedures (including critical care time) DIAGNOSTIC STUDIES: Oxygen Saturation is 95% on RA, adequate by my interpretation.    COORDINATION OF CARE: 10:23 AM-Will order chest x-ray and basic lab.  Patient informed of current plan of treatment and evaluation and agrees with plan.     Labs Review Labs Reviewed - No data to display Imaging Review Dg Chest 2 View  11/26/2013   CLINICAL DATA:  Cough.  EXAM: CHEST  2 VIEW  COMPARISON:  09/08/2013  FINDINGS: Right breast surgical clips are present. The cardiac silhouette is within normal limits. Thoracic aorta is tortuous and calcified. The lungs remain hyperinflated. There is no evidence of focal airspace consolidation, edema, pleural  effusion, or pneumothorax. Old right clavicle fracture is noted with prominent callus formation.  IMPRESSION: No evidence of acute airspace disease.   Electronically Signed   By: Sebastian Ache   On: 11/26/2013 10:57    EKG Interpretation   None       MDM  No diagnosis found. No acute distress. Good color. Chest x-ray negative.  I personally performed the services described in this documentation, which was scribed in my presence. The recorded information has been reviewed and is accurate.       Donnetta Hutching, MD 11/26/13 [phone]

## 2013-11-26 NOTE — ED Notes (Signed)
Pt c/o cough since yesterday.  Reports is nonproductive.  Denies fever.

## 2013-11-29 ENCOUNTER — Inpatient Hospital Stay (HOSPITAL_COMMUNITY)
Admission: EM | Admit: 2013-11-29 | Discharge: 2014-01-01 | DRG: 870 | Disposition: E | Payer: Medicare Other | Attending: Family Medicine | Admitting: Family Medicine

## 2013-11-29 ENCOUNTER — Encounter (HOSPITAL_COMMUNITY): Payer: Self-pay | Admitting: Emergency Medicine

## 2013-11-29 ENCOUNTER — Emergency Department (HOSPITAL_COMMUNITY): Payer: Medicare Other

## 2013-11-29 DIAGNOSIS — D0510 Intraductal carcinoma in situ of unspecified breast: Secondary | ICD-10-CM | POA: Diagnosis present

## 2013-11-29 DIAGNOSIS — R7309 Other abnormal glucose: Secondary | ICD-10-CM | POA: Diagnosis not present

## 2013-11-29 DIAGNOSIS — E870 Hyperosmolality and hypernatremia: Secondary | ICD-10-CM | POA: Diagnosis present

## 2013-11-29 DIAGNOSIS — F0390 Unspecified dementia without behavioral disturbance: Secondary | ICD-10-CM

## 2013-11-29 DIAGNOSIS — Z8249 Family history of ischemic heart disease and other diseases of the circulatory system: Secondary | ICD-10-CM

## 2013-11-29 DIAGNOSIS — G9341 Metabolic encephalopathy: Secondary | ICD-10-CM | POA: Diagnosis present

## 2013-11-29 DIAGNOSIS — J111 Influenza due to unidentified influenza virus with other respiratory manifestations: Secondary | ICD-10-CM

## 2013-11-29 DIAGNOSIS — R0602 Shortness of breath: Secondary | ICD-10-CM | POA: Diagnosis present

## 2013-11-29 DIAGNOSIS — E2749 Other adrenocortical insufficiency: Secondary | ICD-10-CM | POA: Diagnosis present

## 2013-11-29 DIAGNOSIS — E860 Dehydration: Secondary | ICD-10-CM

## 2013-11-29 DIAGNOSIS — N183 Chronic kidney disease, stage 3 unspecified: Secondary | ICD-10-CM | POA: Diagnosis present

## 2013-11-29 DIAGNOSIS — M1990 Unspecified osteoarthritis, unspecified site: Secondary | ICD-10-CM | POA: Diagnosis present

## 2013-11-29 DIAGNOSIS — Z66 Do not resuscitate: Secondary | ICD-10-CM | POA: Diagnosis present

## 2013-11-29 DIAGNOSIS — Z79899 Other long term (current) drug therapy: Secondary | ICD-10-CM

## 2013-11-29 DIAGNOSIS — E2740 Unspecified adrenocortical insufficiency: Secondary | ICD-10-CM | POA: Diagnosis present

## 2013-11-29 DIAGNOSIS — Z87891 Personal history of nicotine dependence: Secondary | ICD-10-CM

## 2013-11-29 DIAGNOSIS — F411 Generalized anxiety disorder: Secondary | ICD-10-CM | POA: Diagnosis present

## 2013-11-29 DIAGNOSIS — E785 Hyperlipidemia, unspecified: Secondary | ICD-10-CM | POA: Diagnosis present

## 2013-11-29 DIAGNOSIS — I509 Heart failure, unspecified: Secondary | ICD-10-CM | POA: Diagnosis not present

## 2013-11-29 DIAGNOSIS — N201 Calculus of ureter: Secondary | ICD-10-CM | POA: Diagnosis present

## 2013-11-29 DIAGNOSIS — J9600 Acute respiratory failure, unspecified whether with hypoxia or hypercapnia: Secondary | ICD-10-CM | POA: Diagnosis present

## 2013-11-29 DIAGNOSIS — Z853 Personal history of malignant neoplasm of breast: Secondary | ICD-10-CM

## 2013-11-29 DIAGNOSIS — Z7982 Long term (current) use of aspirin: Secondary | ICD-10-CM

## 2013-11-29 DIAGNOSIS — F3289 Other specified depressive episodes: Secondary | ICD-10-CM | POA: Diagnosis present

## 2013-11-29 DIAGNOSIS — F03918 Unspecified dementia, unspecified severity, with other behavioral disturbance: Secondary | ICD-10-CM | POA: Diagnosis present

## 2013-11-29 DIAGNOSIS — J1100 Influenza due to unidentified influenza virus with unspecified type of pneumonia: Secondary | ICD-10-CM

## 2013-11-29 DIAGNOSIS — T380X5A Adverse effect of glucocorticoids and synthetic analogues, initial encounter: Secondary | ICD-10-CM | POA: Diagnosis not present

## 2013-11-29 DIAGNOSIS — Z923 Personal history of irradiation: Secondary | ICD-10-CM

## 2013-11-29 DIAGNOSIS — Z9981 Dependence on supplemental oxygen: Secondary | ICD-10-CM

## 2013-11-29 DIAGNOSIS — E876 Hypokalemia: Secondary | ICD-10-CM | POA: Diagnosis present

## 2013-11-29 DIAGNOSIS — Z87442 Personal history of urinary calculi: Secondary | ICD-10-CM

## 2013-11-29 DIAGNOSIS — A419 Sepsis, unspecified organism: Secondary | ICD-10-CM | POA: Diagnosis present

## 2013-11-29 DIAGNOSIS — T502X5A Adverse effect of carbonic-anhydrase inhibitors, benzothiadiazides and other diuretics, initial encounter: Secondary | ICD-10-CM | POA: Diagnosis present

## 2013-11-29 DIAGNOSIS — H9190 Unspecified hearing loss, unspecified ear: Secondary | ICD-10-CM | POA: Diagnosis present

## 2013-11-29 DIAGNOSIS — I129 Hypertensive chronic kidney disease with stage 1 through stage 4 chronic kidney disease, or unspecified chronic kidney disease: Secondary | ICD-10-CM | POA: Diagnosis present

## 2013-11-29 DIAGNOSIS — J189 Pneumonia, unspecified organism: Secondary | ICD-10-CM

## 2013-11-29 DIAGNOSIS — K219 Gastro-esophageal reflux disease without esophagitis: Secondary | ICD-10-CM | POA: Diagnosis present

## 2013-11-29 DIAGNOSIS — J441 Chronic obstructive pulmonary disease with (acute) exacerbation: Secondary | ICD-10-CM | POA: Diagnosis present

## 2013-11-29 DIAGNOSIS — F329 Major depressive disorder, single episode, unspecified: Secondary | ICD-10-CM | POA: Diagnosis present

## 2013-11-29 DIAGNOSIS — K5900 Constipation, unspecified: Secondary | ICD-10-CM | POA: Diagnosis present

## 2013-11-29 DIAGNOSIS — I5033 Acute on chronic diastolic (congestive) heart failure: Secondary | ICD-10-CM | POA: Diagnosis not present

## 2013-11-29 DIAGNOSIS — J15212 Pneumonia due to Methicillin resistant Staphylococcus aureus: Secondary | ICD-10-CM | POA: Diagnosis present

## 2013-11-29 DIAGNOSIS — F0391 Unspecified dementia with behavioral disturbance: Secondary | ICD-10-CM | POA: Diagnosis present

## 2013-11-29 DIAGNOSIS — I10 Essential (primary) hypertension: Secondary | ICD-10-CM | POA: Diagnosis present

## 2013-11-29 DIAGNOSIS — N170 Acute kidney failure with tubular necrosis: Secondary | ICD-10-CM | POA: Diagnosis present

## 2013-11-29 DIAGNOSIS — Z23 Encounter for immunization: Secondary | ICD-10-CM

## 2013-11-29 DIAGNOSIS — R739 Hyperglycemia, unspecified: Secondary | ICD-10-CM | POA: Diagnosis not present

## 2013-11-29 LAB — CBC WITH DIFFERENTIAL/PLATELET
Basophils Relative: 0 % (ref 0–1)
Eosinophils Absolute: 0 10*3/uL (ref 0.0–0.7)
Eosinophils Relative: 0 % (ref 0–5)
Hemoglobin: 13.1 g/dL (ref 12.0–15.0)
Lymphocytes Relative: 6 % — ABNORMAL LOW (ref 12–46)
MCH: 29.1 pg (ref 26.0–34.0)
MCHC: 33.8 g/dL (ref 30.0–36.0)
Monocytes Absolute: 0.6 10*3/uL (ref 0.1–1.0)
Monocytes Relative: 7 % (ref 3–12)
Neutrophils Relative %: 87 % — ABNORMAL HIGH (ref 43–77)
Platelets: 223 10*3/uL (ref 150–400)
RBC: 4.5 MIL/uL (ref 3.87–5.11)
WBC: 8.6 10*3/uL (ref 4.0–10.5)

## 2013-11-29 LAB — URINALYSIS, ROUTINE W REFLEX MICROSCOPIC
Bilirubin Urine: NEGATIVE
Glucose, UA: NEGATIVE mg/dL
Ketones, ur: NEGATIVE mg/dL
Leukocytes, UA: NEGATIVE
Protein, ur: NEGATIVE mg/dL
Specific Gravity, Urine: 1.02 (ref 1.005–1.030)

## 2013-11-29 LAB — TROPONIN I
Troponin I: <0.3 ng/mL (ref <0.30)
Troponin I: <0.3 ng/mL (ref <0.30)

## 2013-11-29 LAB — BLOOD GAS, ARTERIAL
Acid-base deficit: 0.2 mmol/L (ref 0.0–2.0)
Bicarbonate: 23.6 mEq/L (ref 20.0–24.0)
Patient temperature: 37
TCO2: 21.1 mmol/L (ref 0–100)
pCO2 arterial: 36.1 mmHg (ref 35.0–45.0)
pH, Arterial: 7.431 (ref 7.350–7.450)
pO2, Arterial: 63.6 mmHg — ABNORMAL LOW (ref 80.0–100.0)

## 2013-11-29 LAB — URINE MICROSCOPIC-ADD ON

## 2013-11-29 LAB — COMPREHENSIVE METABOLIC PANEL
ALT: 20 U/L (ref 0–35)
AST: 22 U/L (ref 0–37)
Albumin: 3.8 g/dL (ref 3.5–5.2)
CO2: 27 mEq/L (ref 19–32)
Calcium: 8.9 mg/dL (ref 8.4–10.5)
Chloride: 93 mEq/L — ABNORMAL LOW (ref 96–112)
Creatinine, Ser: 0.79 mg/dL (ref 0.50–1.10)
GFR calc non Af Amer: 74 mL/min — ABNORMAL LOW (ref >90)
Glucose, Bld: 170 mg/dL — ABNORMAL HIGH (ref 70–99)
Potassium: 3.9 mEq/L (ref 3.7–5.3)
Sodium: 132 mEq/L — ABNORMAL LOW (ref 137–147)
Total Bilirubin: 0.4 mg/dL (ref 0.3–1.2)

## 2013-11-29 LAB — LACTIC ACID, PLASMA: Lactic Acid, Venous: 1.1 mmol/L (ref 0.5–2.2)

## 2013-11-29 LAB — PRO B NATRIURETIC PEPTIDE: Pro B Natriuretic peptide (BNP): 451.7 pg/mL — ABNORMAL HIGH (ref 0–450)

## 2013-11-29 MED ORDER — SODIUM CHLORIDE 0.9 % IV SOLN
INTRAVENOUS | Status: DC
  Administered 2013-11-30 – 2013-12-02 (×4): via INTRAVENOUS

## 2013-11-29 MED ORDER — SODIUM CHLORIDE 0.9 % IV BOLUS (SEPSIS)
500.0000 mL | Freq: Once | INTRAVENOUS | Status: AC
  Administered 2013-11-29: 500 mL via INTRAVENOUS

## 2013-11-29 MED ORDER — HYDROCODONE-ACETAMINOPHEN 5-325 MG PO TABS
1.0000 | ORAL_TABLET | Freq: Three times a day (TID) | ORAL | Status: DC | PRN
  Administered 2013-11-29 – 2013-11-30 (×2): 1 via ORAL
  Filled 2013-11-29 (×2): qty 1

## 2013-11-29 MED ORDER — ASPIRIN EC 81 MG PO TBEC
81.0000 mg | DELAYED_RELEASE_TABLET | Freq: Every day | ORAL | Status: DC
  Administered 2013-11-30: 81 mg via ORAL
  Filled 2013-11-29: qty 1

## 2013-11-29 MED ORDER — ALBUTEROL SULFATE (2.5 MG/3ML) 0.083% IN NEBU
5.0000 mg | INHALATION_SOLUTION | RESPIRATORY_TRACT | Status: AC
  Administered 2013-11-29: 5 mg via RESPIRATORY_TRACT
  Filled 2013-11-29: qty 6

## 2013-11-29 MED ORDER — SODIUM CHLORIDE 0.9 % IV BOLUS (SEPSIS): 500.0000 mL | Freq: Once | INTRAVENOUS | Status: DC

## 2013-11-29 MED ORDER — VANCOMYCIN HCL IN DEXTROSE 1-5 GM/200ML-% IV SOLN: 1000.0000 mg | INTRAVENOUS | Status: DC

## 2013-11-29 MED ORDER — DOCUSATE SODIUM 100 MG PO CAPS
100.0000 mg | ORAL_CAPSULE | Freq: Two times a day (BID) | ORAL | Status: DC
  Administered 2013-11-29 – 2013-12-04 (×5): 100 mg via ORAL
  Filled 2013-11-29 (×5): qty 1

## 2013-11-29 MED ORDER — VANCOMYCIN HCL IN DEXTROSE 750-5 MG/150ML-% IV SOLN
750.0000 mg | INTRAVENOUS | Status: DC
  Administered 2013-11-30 – 2013-12-03 (×4): 750 mg via INTRAVENOUS
  Filled 2013-11-29 (×5): qty 150

## 2013-11-29 MED ORDER — DEXTROSE 5 % IV SOLN: 1.0000 g | Freq: Three times a day (TID) | INTRAVENOUS | Status: DC

## 2013-11-29 MED ORDER — ONDANSETRON HCL 4 MG/2ML IJ SOLN
4.0000 mg | Freq: Once | INTRAMUSCULAR | Status: AC
  Administered 2013-11-29: 4 mg via INTRAVENOUS
  Filled 2013-11-29: qty 2

## 2013-11-29 MED ORDER — ACETAMINOPHEN 325 MG PO TABS
650.0000 mg | ORAL_TABLET | Freq: Once | ORAL | Status: AC
  Administered 2013-11-29: 650 mg via ORAL
  Filled 2013-11-29: qty 2

## 2013-11-29 MED ORDER — HEPARIN SODIUM (PORCINE) 5000 UNIT/ML IJ SOLN
5000.0000 [IU] | Freq: Three times a day (TID) | INTRAMUSCULAR | Status: DC
  Administered 2013-11-29 – 2013-12-12 (×38): 5000 [IU] via SUBCUTANEOUS
  Filled 2013-11-29 (×38): qty 1

## 2013-11-29 MED ORDER — VANCOMYCIN HCL IN DEXTROSE 1-5 GM/200ML-% IV SOLN
1000.0000 mg | Freq: Once | INTRAVENOUS | Status: AC
  Administered 2013-11-29: 1000 mg via INTRAVENOUS
  Filled 2013-11-29: qty 200

## 2013-11-29 MED ORDER — DULOXETINE HCL 30 MG PO CPEP
30.0000 mg | ORAL_CAPSULE | Freq: Every day | ORAL | Status: DC
  Administered 2013-11-30: 30 mg via ORAL
  Filled 2013-11-29: qty 1

## 2013-11-29 MED ORDER — LUBIPROSTONE 24 MCG PO CAPS
24.0000 ug | ORAL_CAPSULE | Freq: Every day | ORAL | Status: DC
  Administered 2013-11-30: 24 ug via ORAL
  Filled 2013-11-29 (×3): qty 1

## 2013-11-29 MED ORDER — IPRATROPIUM BROMIDE 0.02 % IN SOLN
0.5000 mg | RESPIRATORY_TRACT | Status: AC
  Administered 2013-11-29: 0.5 mg via RESPIRATORY_TRACT
  Filled 2013-11-29: qty 2.5

## 2013-11-29 MED ORDER — ALPRAZOLAM 0.5 MG PO TABS
1.0000 mg | ORAL_TABLET | Freq: Every day | ORAL | Status: DC
  Administered 2013-11-29: 1 mg via ORAL
  Filled 2013-11-29: qty 2

## 2013-11-29 MED ORDER — SODIUM CHLORIDE 0.9 % IV SOLN
INTRAVENOUS | Status: AC
  Administered 2013-11-29: 17:00:00 via INTRAVENOUS

## 2013-11-29 MED ORDER — ALPRAZOLAM 0.5 MG PO TABS: 0.5000 mg | ORAL_TABLET | Freq: Two times a day (BID) | ORAL | Status: DC

## 2013-11-29 MED ORDER — DEXTROSE 5 % IV SOLN
1.0000 g | INTRAVENOUS | Status: DC
  Administered 2013-11-30: 1 g via INTRAVENOUS
  Filled 2013-11-29 (×3): qty 1

## 2013-11-29 MED ORDER — CEFEPIME HCL 1 G IJ SOLR
1.0000 g | Freq: Once | INTRAMUSCULAR | Status: AC
  Administered 2013-11-29: 1 g via INTRAVENOUS

## 2013-11-29 MED ORDER — METHYLPREDNISOLONE SODIUM SUCC 125 MG IJ SOLR
80.0000 mg | Freq: Once | INTRAMUSCULAR | Status: AC
  Administered 2013-11-29: 80 mg via INTRAVENOUS
  Filled 2013-11-29: qty 2

## 2013-11-29 MED ORDER — ALPRAZOLAM 0.5 MG PO TABS
0.5000 mg | ORAL_TABLET | Freq: Every day | ORAL | Status: DC
  Administered 2013-11-29: 0.5 mg via ORAL
  Filled 2013-11-29: qty 1

## 2013-11-29 NOTE — H&P (Addendum)
[facility] History and Physical  Bonnie [name] FAO:[id number] DOB: [birth year] DOA: 12/08/2013  Referring physician: Estell Harpin PCP: Cassell Smiles., MD  Specialists: none yet  Chief Complaint: Confusion, SOB  HPI: Bonnie [name] is a 79 y.o. female none HTN, HLD, dementia, chronic constipation and diarrhea, adrenal insufficiency, acid reflux, breast cancer currently in remission and last radiation treatment approximately 9 months ago, anxiety and depression who presents with altered mental status came to [facility] ed from [facility] 12/13/2013 with flu-like symptoms. She was most recently seen 12/27 in the emergency room where shewas noted to have a nonproductive cough temperature 99.1 and triaged home as chest x-ray was negative. Recently of note she has been admitted 08/24/13-08/29/13 with pneumonia At the bedside she is not really able to tell me where she is, cannot tell me her date of birth cannot tell me the year and therefore review of systems is completely unreliable. History had to be obtained from ancillary sources. I called her son, [name] on [c] [phone] who states that the nurse called him-she was having upepr respiratory infections.  He hasn't noticed any specific aspiration.  He went to the NH last night and it's been quarantined as there eis FLU running rampant there per his report.  Patient did receive flu shot this year.  Last seen by him on Xmas day-was wheezing then. Used to smoke 20 years ago-no inhaler use. At baseline is at the [facility] on the dementia for about 4 months.   Review of Systems: The patient cannot participate in review of systems   Past Medical History  Diagnosis Date  . GERD (gastroesophageal reflux disease)   . Colitis, ischemic   . Osteoarthritis   . Breast cancer     Right Breast - May 2013/ Left Breast - 2003  . Anxiety   . Stress incontinence, female   . Constipation - functional   . Hypertension   . Headache(784.0)   . Hx:  UTI (urinary tract infection)   . HOH (hard of hearing)     both ears; refuses to wear hearing aides  . Dementia   . Benign essential tremor     left hand; intermittent  . Hyperlipidemia   . Adrenal gland hypofunction     takes cortisone daily  . S/P radiation therapy 04/15/12 -05/13/12    RIGHT BREAST  . Chronic constipation 01/04/13  . Fatigue 02/25/13    chronic  . Collar bone fracture   . Depression   . Glucocorticoid deficiency    Past Surgical History  Procedure Laterality Date  . Tonsillectomy      At age 5  . Lumbar laminectomy    . Abdominal hysterectomy    . Bunionectomy      bilateral  . Rotor cuff      Right Shoulder  . Upper gastrointestinal endoscopy    . Kidney stone    . Knee surgery  1987    L  . Foot surgery      left foot 1994, right foot 1995  . Lumpectomy  2003  . Eye surgery  2005  . Back surgery    . Breast surgery  2003    left breast  . Tonsillectomy    . Breast surgery  2013   Social History:  History   Social History Narrative   Lives at home with her two sons who rotate care of her 3 days each.      Allergies  Allergen Reactions  .  Macrodantin Nausea Only    It also causes fever    Family History  Problem Relation Age of Onset  . Heart disease Brother   . Alcohol abuse Brother   . Alcohol abuse Brother   . Healthy Son   . Anesthesia problems Neg Hx   . Heart disease Mother   . Heart disease Father   . Dementia Mother      Prior to Admission medications   Medication Sig Start Date End Date Taking? Authorizing Provider  ALPRAZolam Prudy Feeler) 0.5 MG tablet Take 0.5-1 mg by mouth 2 (two) times daily. One tablet in the morning and 2 at bedtime   Yes Historical Provider, MD  amLODipine (NORVASC) 2.5 MG tablet Take 2.5 mg by mouth daily.   Yes Historical Provider, MD  aspirin EC 81 MG tablet Take 81 mg by mouth daily.   Yes Historical Provider, MD  Aspirin-Acetaminophen-Caffeine (GOODY HEADACHE PO) Take 1 packet by mouth daily as  needed (headaches).   Yes Historical Provider, MD  benzonatate (TESSALON) 100 MG capsule Take 100 mg by mouth 3 (three) times daily as needed for cough.   Yes Historical Provider, MD  docusate sodium (COLACE) 100 MG capsule Take 100 mg by mouth 2 (two) times daily.    Yes Historical Provider, MD  DULoxetine (CYMBALTA) 30 MG capsule Take 30 mg by mouth daily.   Yes Historical Provider, MD  HYDROcodone-acetaminophen (NORCO) 10-325 MG per tablet Take 1-2 tablets by mouth every 6 (six) hours as needed for moderate pain.   Yes Historical Provider, MD  hydrocortisone (CORTEF) 20 MG tablet Take 10 mg by mouth daily.    Yes Historical Provider, MD  Loteprednol Etabonate (LOTEMAX) 0.5 % GEL Apply 1 drop to eye 3 (three) times daily.   Yes Historical Provider, MD  lubiprostone (AMITIZA) 24 MCG capsule Take 24 mcg by mouth daily with breakfast.    Yes Historical Provider, MD  Multiple Vitamin (DAILY VITE PO) Take 1 tablet by mouth daily.   Yes Historical Provider, MD  pravastatin (PRAVACHOL) 80 MG tablet Take 1 tablet (80 mg total) by mouth daily. 08/27/13  Yes Renae Fickle, MD  psyllium (METAMUCIL) 58.6 % packet Take 1 packet by mouth daily.   Yes Historical Provider, MD   Physical Exam: Filed Vitals:   11/05/2013 1038 11/08/2013 1223 11/27/2013 1242 11/25/2013 1254  BP: 119/59 137/62 156/77   Pulse: 101 104 108   Temp: 99.9 F (37.7 C)   100.8 F (38.2 C)  TempSrc: Oral   Oral  Resp: 22  24   SpO2: 92% 96% 94%      General:  Confused-R.N. states tha she wasn'tthis confused earlier in the emergency room  Eyes: equally reactive to light  ENT: soft supple  Neck: no JVD no bruit  Cardiovascular: S1-S2 tachycardic,regular  Respiratory: course breath sounds-cannot participatewith further exam  Abdomen: soft nontender  Skin: not examined  Musculoskeletal: grossly intact  Psychiatric: confused  Neurologic: confused grossly intact smile symmetrical moves upper and lower extremity is equally  cannot participate in full exam reacts to menace  Labs on Admission:  Basic Metabolic Panel:  Recent Labs Lab 11/15/2013 1233  NA 132*  K 3.9  CL 93*  CO2 27  GLUCOSE 170*  BUN 15  CREATININE 0.79  CALCIUM 8.9   Liver Function Tests:  Recent Labs Lab 11/26/2013 1233  AST 22  ALT 20  ALKPHOS 81  BILITOT 0.4  PROT 7.2  ALBUMIN 3.8   No results found for  this basename: LIPASE, AMYLASE,  in the last 168 hours No results found for this basename: AMMONIA,  in the last 168 hours CBC:  Recent Labs Lab 12/07/13 1233  WBC 8.6  NEUTROABS 7.5  HGB 13.1  HCT 38.8  MCV 86.2  PLT 223   Cardiac Enzymes: No results found for this basename: CKTOTAL, CKMB, CKMBINDEX, TROPONINI,  in the last 168 hours  BNP (last 3 results)  Recent Labs  2013/12/07 1233  PROBNP 451.7*   CBG: No results found for this basename: GLUCAP,  in the last 168 hours  Radiological Exams on Admission: Dg Chest 2 View  12/07/2013   CLINICAL DATA:  Influenza.  Weakness.  EXAM: CHEST  2 VIEW  COMPARISON:  11/26/2013 and previous  FINDINGS: There are patchy infiltrates in both lower lobes, right more than left, consistent with pneumonia. The upper lungs are clear. No effusions. Heart size is normal. There is calcification of the aorta up.  IMPRESSION: Patchy lower lobe pneumonia, right more than left.   Electronically Signed   By: Paulina Fusi M.D.   On: 2013-12-07 13:26    EKG: Independently reviewed. Sinus tachycardia, left anterior fascicular block axis -45, bifascicular block also noted with a rapid areas in V2 RSR prime pattern rate related ST-T wave depressions which are not new from prior EKG 08/2013.  Assessment/Plan Principal Problem:    Acute type I respiratory failure-ABG = PaO2 of 60-see below   Sepsis-Early, source either viral pneumonia vs. Likely secondarybacterial pneumonia. Obtain sputum cultures given this is the most sensitive to determine pathogen. Blood cultures have not yet been drawn.  Continue HCA Pcoverage started in the emergency room.will need generous IV fluids, rate 125 cc per hour x12 hours. Repeat labs in a.m.-as she has a history of hypo-adrenal is an will give stress dose steroids 80 mg Solu-Medrol every 8 hourly for the next 1-2 days despite conflicting evidence per critical care guidelines.   H. Influenza positive-would not treat with Tamiflu at this stage as she's had these symptoms for more than 3 days and there is no evidence that this will help in this specific case   Acute metabolic encephalopathy-likely secondary to hypoxia/contributor = stage III to 5 dementia. If persists, will obtain ammonia.   DCIS (ductal carcinoma in situ) of breast-Stable at present outpatient followup. If no improvement consider CT scan chest and discussion with family   Hypoadrenalism-see above   Hypertension-actually hypertensive at present and tachycardic denoting early sepsis. Continue amlodipine for now low-dose 2.5 mg   Hyperlipidemia-statin on hold   Dementia-at least stage III or 4. Will discuss further with son in the morning. She comes from the [facility] [place] [facility]   Metabolic encephalopathy-see above   Acute on chronic kidney disease, stage 3-normal   Elevated proBNP-unclear how to interpret lab value of 451 in a setting of sepsis. Would hold offon Lasix/repeat echocardiogram as this was done 08/26/13   Full code, Brief discussion with son at bedside Inpatient  Time spent: 76  Mahala Menghini [facility] Pager [phone]  If 7PM-7AM, please contact night-coverage [email or web address] Password [email or web address] 12-07-2013, 2:06 PM

## 2013-11-29 NOTE — Progress Notes (Addendum)
ANTIBIOTIC CONSULT NOTE - INITIAL  Pharmacy Consult for Vancomycin & Cefepime Indication: Pneumonia  Allergies  Allergen Reactions  . Macrodantin Nausea Only    It also causes fever    Patient Measurements: Height: 5\' 2"  (157.5 cm) Weight: 88 lb 6.5 oz (40.1 kg) IBW/kg (Calculated) : 50.1  Vital Signs: Temp: 100.8 F (38.2 C) (12/30 1254) Temp src: Oral (12/30 1254) BP: 142/66 mmHg (12/30 1430) Pulse Rate: 101 (12/30 1400) Intake/Output from previous day:   Intake/Output from this shift:    Labs:  Recent Labs  12/10/2013 1233  WBC 8.6  HGB 13.1  PLT 223  CREATININE 0.79   Estimated Creatinine Clearance: 33.1 ml/min (by C-G formula based on Cr of 0.79). No results found for this basename: VANCOTROUGH, VANCOPEAK, VANCORANDOM, GENTTROUGH, GENTPEAK, GENTRANDOM, TOBRATROUGH, TOBRAPEAK, TOBRARND, AMIKACINPEAK, AMIKACINTROU, AMIKACIN,  in the last 72 hours   Microbiology: No results found for this or any previous visit (from the past 720 hour(s)).  Medical History: Past Medical History  Diagnosis Date  . GERD (gastroesophageal reflux disease)   . Colitis, ischemic   . Osteoarthritis   . Breast cancer     Right Breast - May 2013/ Left Breast - 2003  . Anxiety   . Stress incontinence, female   . Constipation - functional   . Hypertension   . Headache(784.0)   . Hx: UTI (urinary tract infection)   . HOH (hard of hearing)     both ears; refuses to wear hearing aides  . Dementia   . Benign essential tremor     left hand; intermittent  . Hyperlipidemia   . Adrenal gland hypofunction     takes cortisone daily  . S/P radiation therapy 04/15/12 -05/13/12    RIGHT BREAST  . Chronic constipation 01/04/13  . Fatigue 02/25/13    chronic  . Collar bone fracture   . Depression   . Glucocorticoid deficiency     Medications:  Scheduled:  . sodium chloride   Intravenous STAT  . ALPRAZolam  0.5 mg Oral Daily   And  . ALPRAZolam  1 mg Oral QHS  . [START ON 11/30/2013]  aspirin EC  81 mg Oral Daily  . ceFEPime (MAXIPIME) IV  1 g Intravenous Q8H  . docusate sodium  100 mg Oral BID  . [START ON 11/30/2013] DULoxetine  30 mg Oral Daily  . heparin  5,000 Units Subcutaneous Q8H  . [START ON 11/30/2013] lubiprostone  24 mcg Oral Q breakfast  . vancomycin  1,000 mg Intravenous Once   Assessment: 79 yo F admitted from NH with flu-like symptoms.  CXR + PNA.  Broad-spectrum antibiotics to cover for HCAP.   Renal function at patient's baseline. Estimated CrCl 30-40 ml/min.  Cefepime 12/30>> Vancomycin 12/30>>  Goal of Therapy:  Vancomycin trough level 15-20 mcg/ml  Plan:  Cefepime 1gm IV q24h Vancomycin 1gm IV x1 then 750mg  IV q24h Check Vancomycin trough at steady state Monitor renal function and cx data   Elson Clan 2013/12/10,4:20 PM

## 2013-11-29 NOTE — ED Provider Notes (Addendum)
CSN: [id number]     Arrival date & time 11/15/2013  1043 History  This chart was scribed for Shelda Jakes, MD by Dorothey Baseman, ED Scribe. This patient was seen in room APA12/[facility]12 and the patient's care was started at 12:03 PM.    Chief Complaint  Patient presents with  . Influenza   Patient is a 79 y.o. female presenting with flu symptoms. The history is provided by the patient. No language interpreter was used.  Influenza Presenting symptoms: cough, fever, headache, myalgias and shortness of breath (baseline)   Presenting symptoms: no diarrhea, no nausea and no vomiting   Cough:    Cough characteristics:  Dry and productive   Severity:  Moderate   Onset quality:  Sudden   Timing:  Constant   Progression:  Unchanged Fever:    Timing:  Sporadic   Temp source:  Subjective Myalgias:    Location:  Generalized   Severity:  Mild   Onset quality:  Sudden   Timing:  Constant Severity:  Mild Onset quality:  Sudden Chronicity:  New Associated symptoms: chills   Risk factors: being elderly    HPI Comments: Bonnie [name] is a 79 y.o. Female with a history of dementia brought in by EMS who presents to the Emergency Department complaining of a constant, productive cough with associated subjective fever (99.9 measured in the ED), chills, myalgias, headache onset this morning. She reports associated shortness of breath that is normal for her and denies any recent changes. Patient is on 2 L of oxygen at home, but she is unable to recall why she is on it. She denies chest pain, nasuea, emesis, diarrhea, leg swelling, dysuria, visual disturbance, rash. Patient reports that she did not receive a flu vaccination this year. Patient reports that she lives alone and does not have any family nearby. She denies anticoagulant medication use. Patient also has a history of HTN and hyperlipidemia.   Past Medical History  Diagnosis Date  . GERD (gastroesophageal reflux disease)   . Colitis, ischemic   .  Osteoarthritis   . Breast cancer     Right Breast - May 2013/ Left Breast - 2003  . Anxiety   . Stress incontinence, female   . Constipation - functional   . Hypertension   . Headache(784.0)   . Hx: UTI (urinary tract infection)   . HOH (hard of hearing)     both ears; refuses to wear hearing aides  . Dementia   . Benign essential tremor     left hand; intermittent  . Hyperlipidemia   . Adrenal gland hypofunction     takes cortisone daily  . S/P radiation therapy 04/15/12 -05/13/12    RIGHT BREAST  . Chronic constipation 01/04/13  . Fatigue 02/25/13    chronic  . Collar bone fracture   . Depression   . Glucocorticoid deficiency    Past Surgical History  Procedure Laterality Date  . Tonsillectomy      At age 30  . Lumbar laminectomy    . Abdominal hysterectomy    . Bunionectomy      bilateral  . Rotor cuff      Right Shoulder  . Upper gastrointestinal endoscopy    . Kidney stone    . Knee surgery  1987    L  . Foot surgery      left foot 1994, right foot 1995  . Lumpectomy  2003  . Eye surgery  2005  . Back surgery    .  Breast surgery  2003    left breast  . Tonsillectomy    . Breast surgery  2013   Family History  Problem Relation Age of Onset  . Heart disease Brother   . Alcohol abuse Brother   . Alcohol abuse Brother   . Healthy Son   . Anesthesia problems Neg Hx   . Heart disease Mother   . Heart disease Father   . Dementia Mother    History  Substance Use Topics  . Smoking status: Former Smoker -- 0.50 packs/day for 20 years    Types: Cigarettes    Quit date: 09/21/1996  . Smokeless tobacco: Never Used     Comment: smoked to 3-4 cigartettes a day  . Alcohol Use: Yes     Comment: Occasional   OB History   Grav Para Term Preterm Abortions TAB SAB Ect Mult Living   2 2 2       2      Review of Systems  Constitutional: Positive for fever and chills.  Eyes: Negative for visual disturbance.  Respiratory: Positive for cough and shortness of breath  (baseline).   Cardiovascular: Negative for chest pain and leg swelling.  Gastrointestinal: Negative for nausea, vomiting and diarrhea.  Genitourinary: Negative for dysuria.  Musculoskeletal: Positive for myalgias.  Skin: Negative for rash.  Neurological: Positive for headaches.  Hematological: Does not bruise/bleed easily.  Psychiatric/Behavioral: Negative for confusion.    Allergies  Macrodantin  Home Medications   Current Outpatient Rx  Name  Route  Sig  Dispense  Refill  . ALPRAZolam (XANAX) 0.5 MG tablet   Oral   Take 0.5-1 mg by mouth 2 (two) times daily. One tablet in the morning and 2 at bedtime         . amLODipine (NORVASC) 2.5 MG tablet   Oral   Take 2.5 mg by mouth daily.         Marland Kitchen aspirin EC 81 MG tablet   Oral   Take 81 mg by mouth daily.         . Aspirin-Acetaminophen-Caffeine (GOODY HEADACHE PO)   Oral   Take 1 packet by mouth daily as needed (headaches).         . benzonatate (TESSALON) 100 MG capsule   Oral   Take 100 mg by mouth 3 (three) times daily as needed for cough.         . docusate sodium (COLACE) 100 MG capsule   Oral   Take 100 mg by mouth 2 (two) times daily.          . DULoxetine (CYMBALTA) 30 MG capsule   Oral   Take 30 mg by mouth daily.         Marland Kitchen HYDROcodone-acetaminophen (NORCO) 10-325 MG per tablet   Oral   Take 1-2 tablets by mouth every 6 (six) hours as needed for moderate pain.         . hydrocortisone (CORTEF) 20 MG tablet   Oral   Take 10 mg by mouth daily.          . Loteprednol Etabonate (LOTEMAX) 0.5 % GEL   Ophthalmic   Apply 1 drop to eye 3 (three) times daily.         Marland Kitchen lubiprostone (AMITIZA) 24 MCG capsule   Oral   Take 24 mcg by mouth daily with breakfast.          . Multiple Vitamin (DAILY VITE PO)   Oral   Take 1 tablet  by mouth daily.         . pravastatin (PRAVACHOL) 80 MG tablet   Oral   Take 1 tablet (80 mg total) by mouth daily.   30 tablet   0   . psyllium  (METAMUCIL) 58.6 % packet   Oral   Take 1 packet by mouth daily.          Triage Vitals: BP 119/59  Pulse 101  Temp(Src) 99.9 F (37.7 C) (Oral)  Resp 22  SpO2 92%  Physical Exam  Nursing note and vitals reviewed. Constitutional: She is oriented to person, place, and time. She appears well-developed and well-nourished. No distress.  Patient is very sleepy during exam, but is easily arousable and responsive to verbal stimuli.   HENT:  Head: Normocephalic and atraumatic.  Mouth/Throat: Oropharynx is clear and moist.  Eyes: Conjunctivae and EOM are normal.  Neck: Normal range of motion. Neck supple.  Cardiovascular: Normal rate, regular rhythm and normal heart sounds.   Pulmonary/Chest: Effort normal and breath sounds normal. No respiratory distress.  Abdominal: Soft. Bowel sounds are normal. She exhibits no distension. There is no tenderness.  Musculoskeletal: Normal range of motion. She exhibits no edema.  Neurological: She is alert and oriented to person, place, and time. No cranial nerve deficit. She exhibits normal muscle tone. Coordination normal.  Skin: Skin is warm and dry.  Psychiatric: She has a normal mood and affect. Her behavior is normal.    ED Course  Procedures (including critical care time)  DIAGNOSTIC STUDIES: Oxygen Saturation is 92% on room air, adequate by my interpretation.    COORDINATION OF CARE: 12:09 PM- Ordered a chest x-ray, blood labs, and UA. Will order IV fluids, Tylenol, and Zofran to manage symptoms. Discussed treatment plan with patient at bedside and patient verbalized agreement.   Results for orders placed during the hospital encounter of 2013-12-24  PRO B NATRIURETIC PEPTIDE      Result Value Range   Pro B Natriuretic peptide (BNP) 451.7 (*) 0 - 450 pg/mL  COMPREHENSIVE METABOLIC PANEL      Result Value Range   Sodium 132 (*) 137 - 147 mEq/L   Potassium 3.9  3.7 - 5.3 mEq/L   Chloride 93 (*) 96 - 112 mEq/L   CO2 27  19 - 32 mEq/L    Glucose, Bld 170 (*) 70 - 99 mg/dL   BUN 15  6 - 23 mg/dL   Creatinine, Ser 4.78  0.50 - 1.10 mg/dL   Calcium 8.9  8.4 - 29.5 mg/dL   Total Protein 7.2  6.0 - 8.3 g/dL   Albumin 3.8  3.5 - 5.2 g/dL   AST 22  0 - 37 U/L   ALT 20  0 - 35 U/L   Alkaline Phosphatase 81  39 - 117 U/L   Total Bilirubin 0.4  0.3 - 1.2 mg/dL   GFR calc non Af Amer 74 (*) >90 mL/min   GFR calc Af Amer 86 (*) >90 mL/min  CBC WITH DIFFERENTIAL      Result Value Range   WBC 8.6  4.0 - 10.5 K/uL   RBC 4.50  3.87 - 5.11 MIL/uL   Hemoglobin 13.1  12.0 - 15.0 g/dL   HCT 62.1  30.8 - 65.7 %   MCV 86.2  78.0 - 100.0 fL   MCH 29.1  26.0 - 34.0 pg   MCHC 33.8  30.0 - 36.0 g/dL   RDW 84.6  96.2 - 95.2 %   Platelets  223  150 - 400 K/uL   Neutrophils Relative % 87 (*) 43 - 77 %   Neutro Abs 7.5  1.7 - 7.7 K/uL   Lymphocytes Relative 6 (*) 12 - 46 %   Lymphs Abs 0.5 (*) 0.7 - 4.0 K/uL   Monocytes Relative 7  3 - 12 %   Monocytes Absolute 0.6  0.1 - 1.0 K/uL   Eosinophils Relative 0  0 - 5 %   Eosinophils Absolute 0.0  0.0 - 0.7 K/uL   Basophils Relative 0  0 - 1 %   Basophils Absolute 0.0  0.0 - 0.1 K/uL   WBC Morphology INCREASED BANDS (>20% BANDS)    LACTIC ACID, PLASMA      Result Value Range   Lactic Acid, Venous 1.1  0.5 - 2.2 mmol/L     EKG Interpretation    Date/Time:  Tuesday 2013/12/13 12:23:21 EST Ventricular Rate:  106 PR Interval:  190 QRS Duration: 138 QT Interval:  408 QTC Calculation: 541 R Axis:   -73 Text Interpretation:  Sinus tachycardia Right bundle branch block Left anterior fascicular block Bifascicular block  Abnormal ECG When compared with ECG of 24-Oct-2013 14:51, No significant change was found Confirmed by Maliya Marich  MD, Miu Chiong ([id number]) on 12/13/13 1:46:26 PM           Results for orders placed during the hospital encounter of 2013-12-13  PRO B NATRIURETIC PEPTIDE      Result Value Range   Pro B Natriuretic peptide (BNP) 451.7 (*) 0 - 450 pg/mL  COMPREHENSIVE  METABOLIC PANEL      Result Value Range   Sodium 132 (*) 137 - 147 mEq/L   Potassium 3.9  3.7 - 5.3 mEq/L   Chloride 93 (*) 96 - 112 mEq/L   CO2 27  19 - 32 mEq/L   Glucose, Bld 170 (*) 70 - 99 mg/dL   BUN 15  6 - 23 mg/dL   Creatinine, Ser 0.98  0.50 - 1.10 mg/dL   Calcium 8.9  8.4 - 11.9 mg/dL   Total Protein 7.2  6.0 - 8.3 g/dL   Albumin 3.8  3.5 - 5.2 g/dL   AST 22  0 - 37 U/L   ALT 20  0 - 35 U/L   Alkaline Phosphatase 81  39 - 117 U/L   Total Bilirubin 0.4  0.3 - 1.2 mg/dL   GFR calc non Af Amer 74 (*) >90 mL/min   GFR calc Af Amer 86 (*) >90 mL/min  CBC WITH DIFFERENTIAL      Result Value Range   WBC 8.6  4.0 - 10.5 K/uL   RBC 4.50  3.87 - 5.11 MIL/uL   Hemoglobin 13.1  12.0 - 15.0 g/dL   HCT 14.7  82.9 - 56.2 %   MCV 86.2  78.0 - 100.0 fL   MCH 29.1  26.0 - 34.0 pg   MCHC 33.8  30.0 - 36.0 g/dL   RDW 13.0  86.5 - 78.4 %   Platelets 223  150 - 400 K/uL   Neutrophils Relative % 87 (*) 43 - 77 %   Neutro Abs 7.5  1.7 - 7.7 K/uL   Lymphocytes Relative 6 (*) 12 - 46 %   Lymphs Abs 0.5 (*) 0.7 - 4.0 K/uL   Monocytes Relative 7  3 - 12 %   Monocytes Absolute 0.6  0.1 - 1.0 K/uL   Eosinophils Relative 0  0 - 5 %   Eosinophils Absolute  0.0  0.0 - 0.7 K/uL   Basophils Relative 0  0 - 1 %   Basophils Absolute 0.0  0.0 - 0.1 K/uL   WBC Morphology INCREASED BANDS (>20% BANDS)    LACTIC ACID, PLASMA      Result Value Range   Lactic Acid, Venous 1.1  0.5 - 2.2 mmol/L   Dg Chest 2 View  Dec 03, 2013   CLINICAL DATA:  Influenza.  Weakness.  EXAM: CHEST  2 VIEW  COMPARISON:  11/26/2013 and previous  FINDINGS: There are patchy infiltrates in both lower lobes, right more than left, consistent with pneumonia. The upper lungs are clear. No effusions. Heart size is normal. There is calcification of the aorta up.  IMPRESSION: Patchy lower lobe pneumonia, right more than left.   Electronically Signed   By: Paulina Fusi M.D.   On: 2013-12-03 13:26   Dg Chest 2 View  11/26/2013    CLINICAL DATA:  Cough.  EXAM: CHEST  2 VIEW  COMPARISON:  09/08/2013  FINDINGS: Right breast surgical clips are present. The cardiac silhouette is within normal limits. Thoracic aorta is tortuous and calcified. The lungs remain hyperinflated. There is no evidence of focal airspace consolidation, edema, pleural effusion, or pneumothorax. Old right clavicle fracture is noted with prominent callus formation.  IMPRESSION: No evidence of acute airspace disease.   Electronically Signed   By: Sebastian Ache   On: 11/26/2013 10:57     MDM   1. Influenza   2. HCAP (healthcare-associated pneumonia)    The patient was some hypoxia off oxygen. A little confusing but sounds as if patient may be on oxygen at home. Patient with flulike symptoms. Was value in ED on December 27 was negative chest x-ray. Chest x-ray now consistent with pneumonia. Patient has had recent admission to the hospital in the fall. We'll treat as a healthcare acquired pneumonia. Blood cultures have been requested prior to antibiotics. Protocol antibiotics ordered. Patient will require admission. She is somewhat somnolent. Slightly tachycardic. And off oxygen has a hypoxia problem. Patient reportedly lives by herself. Elderly and not able to take care of herself under the circumstances.  No significant leukocytosis no significant electrolyte abnormalities. EKG without any changes compared old. Lactic acid is not consistent with sepsis.  Will contact the hospitalist for admission.  I personally performed the services described in this documentation, which was scribed in my presence. The recorded information has been reviewed and is accurate.      Shelda Jakes, MD December 03, 2013 1354  Addendum: Discussed with hospitalist will admit they have requested that we give stress dose of site Medrol 80 mg. Verified that it was healthcare acquired pneumonia protocol antibiotics that we provided. Also get an arterial blood gas. Clinically patient  most likely need step down admission. Patient lives at home by herself as far as we know '. Past medical history does list dementia however she is and able to answer review of system questions seems appropriately. Still do question the history of oxygen at home. Question this because she does not have a history of COPD or asthma.  Shelda Jakes, MD Dec 03, 2013 [phone]

## 2013-11-29 NOTE — ED Notes (Signed)
Pt complaining of chest pain, repeat EKG

## 2013-11-29 NOTE — Progress Notes (Signed)
UR chart review completed.  

## 2013-11-29 NOTE — ED Notes (Addendum)
Pt back from x-ray.

## 2013-11-29 NOTE — ED Notes (Signed)
Patient placed on continuous cardiac monitoring, continuous pulse 0x monitoring, and patient on 2L O2 [address].

## 2013-11-29 NOTE — ED Notes (Signed)
[facility] called for report 

## 2013-11-29 NOTE — ED Notes (Signed)
Complain of not feeling well today. Pt woke up with a fever today and non productive cough

## 2013-11-30 ENCOUNTER — Inpatient Hospital Stay (HOSPITAL_COMMUNITY): Payer: Medicare Other | Admitting: Anesthesiology

## 2013-11-30 ENCOUNTER — Encounter (HOSPITAL_COMMUNITY): Payer: Medicare Other | Admitting: Anesthesiology

## 2013-11-30 ENCOUNTER — Inpatient Hospital Stay (HOSPITAL_COMMUNITY): Payer: Medicare Other

## 2013-11-30 DIAGNOSIS — A419 Sepsis, unspecified organism: Secondary | ICD-10-CM | POA: Diagnosis present

## 2013-11-30 LAB — COMPREHENSIVE METABOLIC PANEL
BUN: 11 mg/dL (ref 6–23)
CO2: 24 mEq/L (ref 19–32)
Calcium: 8.8 mg/dL (ref 8.4–10.5)
Chloride: 95 mEq/L — ABNORMAL LOW (ref 96–112)
Creatinine, Ser: 0.7 mg/dL (ref 0.50–1.10)
GFR calc Af Amer: 90 mL/min — ABNORMAL LOW (ref >90)
GFR calc non Af Amer: 77 mL/min — ABNORMAL LOW (ref >90)
Glucose, Bld: 161 mg/dL — ABNORMAL HIGH (ref 70–99)

## 2013-11-30 LAB — CBC WITH DIFFERENTIAL/PLATELET
Basophils Relative: 0 % (ref 0–1)
Eosinophils Absolute: 0 10*3/uL (ref 0.0–0.7)
Eosinophils Relative: 0 % (ref 0–5)
Hemoglobin: 12.8 g/dL (ref 12.0–15.0)
Lymphs Abs: 0.9 10*3/uL (ref 0.7–4.0)
MCH: 29.2 pg (ref 26.0–34.0)
MCHC: 34 g/dL (ref 30.0–36.0)
Monocytes Absolute: 0.6 10*3/uL (ref 0.1–1.0)
Neutro Abs: 5.8 10*3/uL (ref 1.7–7.7)
Neutrophils Relative %: 80 % — ABNORMAL HIGH (ref 43–77)

## 2013-11-30 LAB — BLOOD GAS, ARTERIAL
Acid-base deficit: 0.5 mmol/L (ref 0.0–2.0)
Bicarbonate: 24.7 mEq/L — ABNORMAL HIGH (ref 20.0–24.0)
Drawn by: 25788
Drawn by: 234301
FIO2: 0.5 %
FIO2: 100 %
FIO2: 100 %
O2 Saturation: 97.8 %
O2 Saturation: 99.6 %
PEEP: 5 cmH2O
Patient temperature: 37
Patient temperature: 37
RATE: 12 resp/min
pH, Arterial: 7.438 (ref 7.350–7.450)
pH, Arterial: 7.355 (ref 7.350–7.450)
pO2, Arterial: 113 mmHg — ABNORMAL HIGH (ref 80.0–100.0)
pO2, Arterial: 347 mmHg — ABNORMAL HIGH (ref 80.0–100.0)

## 2013-11-30 LAB — HIV ANTIBODY (ROUTINE TESTING W REFLEX): HIV: NONREACTIVE

## 2013-11-30 LAB — STREP PNEUMONIAE URINARY ANTIGEN: Strep Pneumo Urinary Antigen: NEGATIVE

## 2013-11-30 LAB — TROPONIN I: Troponin I: <0.3 ng/mL (ref <0.30)

## 2013-11-30 LAB — PROCALCITONIN: Procalcitonin: 12.13 ng/mL

## 2013-11-30 MED ORDER — MORPHINE SULFATE 2 MG/ML IJ SOLN
0.5000 mg | INTRAMUSCULAR | Status: DC | PRN
  Administered 2013-11-30: 0.5 mg via INTRAVENOUS
  Administered 2013-12-01 – 2013-12-06 (×2): 1 mg via INTRAVENOUS
  Administered 2013-12-08: 0.5 mg via INTRAVENOUS
  Administered 2013-12-08 – 2013-12-09 (×3): 1 mg via INTRAVENOUS
  Filled 2013-11-30 (×7): qty 1

## 2013-11-30 MED ORDER — ALBUTEROL SULFATE (2.5 MG/3ML) 0.083% IN NEBU: 2.5000 mg | INHALATION_SOLUTION | Freq: Four times a day (QID) | RESPIRATORY_TRACT | Status: DC

## 2013-11-30 MED ORDER — IPRATROPIUM BROMIDE 0.02 % IN SOLN: 0.5000 mg | Freq: Four times a day (QID) | RESPIRATORY_TRACT | Status: DC

## 2013-11-30 MED ORDER — ROCURONIUM BROMIDE 50 MG/5ML IV SOLN
INTRAVENOUS | Status: AC
  Filled 2013-11-30: qty 2

## 2013-11-30 MED ORDER — FENTANYL CITRATE 0.05 MG/ML IJ SOLN
50.0000 ug | INTRAMUSCULAR | Status: AC | PRN
  Administered 2013-11-30 – 2013-12-01 (×3): 50 ug via INTRAVENOUS
  Filled 2013-11-30 (×6): qty 2

## 2013-11-30 MED ORDER — IOHEXOL 300 MG/ML  SOLN
100.0000 mL | Freq: Once | INTRAMUSCULAR | Status: AC | PRN
  Administered 2013-11-30: 100 mL via INTRAVENOUS

## 2013-11-30 MED ORDER — OSELTAMIVIR PHOSPHATE 75 MG PO CAPS
75.0000 mg | ORAL_CAPSULE | Freq: Two times a day (BID) | ORAL | Status: DC
  Administered 2013-11-30 – 2013-12-01 (×5): 75 mg via ORAL
  Filled 2013-11-30 (×6): qty 1

## 2013-11-30 MED ORDER — ALPRAZOLAM 0.5 MG PO TABS
0.5000 mg | ORAL_TABLET | Freq: Three times a day (TID) | ORAL | Status: DC
  Administered 2013-11-30 – 2013-12-07 (×18): 0.5 mg via ORAL
  Filled 2013-11-30 (×19): qty 1

## 2013-11-30 MED ORDER — MIDAZOLAM HCL 2 MG/2ML IJ SOLN
INTRAMUSCULAR | Status: AC
  Filled 2013-11-30: qty 2

## 2013-11-30 MED ORDER — SODIUM CHLORIDE 0.9 % IJ SOLN
10.0000 mL | Freq: Two times a day (BID) | INTRAMUSCULAR | Status: DC
  Administered 2013-11-30 – 2013-12-12 (×13): 10 mL

## 2013-11-30 MED ORDER — HYDROCODONE-ACETAMINOPHEN 5-325 MG PO TABS
1.0000 | ORAL_TABLET | Freq: Four times a day (QID) | ORAL | Status: DC | PRN
  Administered 2013-11-30 (×2): 1 via ORAL
  Filled 2013-11-30 (×2): qty 1

## 2013-11-30 MED ORDER — SUCCINYLCHOLINE CHLORIDE 20 MG/ML IJ SOLN
INTRAMUSCULAR | Status: DC | PRN
  Administered 2013-11-30: 80 mg via INTRAVENOUS

## 2013-11-30 MED ORDER — PANTOPRAZOLE SODIUM 40 MG IV SOLR
40.0000 mg | Freq: Every day | INTRAVENOUS | Status: DC
  Administered 2013-11-30 – 2013-12-12 (×13): 40 mg via INTRAVENOUS
  Filled 2013-11-30 (×13): qty 40

## 2013-11-30 MED ORDER — IPRATROPIUM-ALBUTEROL 0.5-2.5 (3) MG/3ML IN SOLN
3.0000 mL | Freq: Four times a day (QID) | RESPIRATORY_TRACT | Status: DC
  Administered 2013-11-30 – 2013-12-10 (×41): 3 mL via RESPIRATORY_TRACT
  Filled 2013-11-30 (×40): qty 3

## 2013-11-30 MED ORDER — KETOROLAC TROMETHAMINE 30 MG/ML IJ SOLN
15.0000 mg | Freq: Four times a day (QID) | INTRAMUSCULAR | Status: AC
  Administered 2013-11-30 – 2013-12-02 (×6): 15 mg via INTRAVENOUS
  Filled 2013-11-30 (×7): qty 1

## 2013-11-30 MED ORDER — FENTANYL CITRATE 0.05 MG/ML IJ SOLN
100.0000 ug | Freq: Once | INTRAMUSCULAR | Status: AC
  Administered 2013-11-30: 100 ug via INTRAVENOUS

## 2013-11-30 MED ORDER — PROPOFOL 10 MG/ML IV EMUL
INTRAVENOUS | Status: AC
  Filled 2013-11-30: qty 20

## 2013-11-30 MED ORDER — ETOMIDATE 2 MG/ML IV SOLN
INTRAVENOUS | Status: DC | PRN
  Administered 2013-11-30: 10 mg via INTRAVENOUS

## 2013-11-30 MED ORDER — MIDAZOLAM HCL 2 MG/2ML IJ SOLN
INTRAMUSCULAR | Status: AC
  Administered 2013-11-30: 13:00:00
  Filled 2013-11-30: qty 2

## 2013-11-30 MED ORDER — BIOTENE DRY MOUTH MT LIQD
15.0000 mL | Freq: Four times a day (QID) | OROMUCOSAL | Status: DC
  Administered 2013-11-30 – 2013-12-12 (×47): 15 mL via OROMUCOSAL

## 2013-11-30 MED ORDER — ACETAMINOPHEN 10 MG/ML IV SOLN: 1000.0000 mg | Freq: Four times a day (QID) | INTRAVENOUS | Status: DC

## 2013-11-30 MED ORDER — ETOMIDATE 2 MG/ML IV SOLN
INTRAVENOUS | Status: AC
  Filled 2013-11-30: qty 10

## 2013-11-30 MED ORDER — SODIUM CHLORIDE 0.9 % IJ SOLN: 10.0000 mL | INTRAMUSCULAR | Status: DC | PRN

## 2013-11-30 MED ORDER — LIDOCAINE HCL (CARDIAC) 20 MG/ML IV SOLN
INTRAVENOUS | Status: AC
  Filled 2013-11-30: qty 5

## 2013-11-30 MED ORDER — CHLORHEXIDINE GLUCONATE CLOTH 2 % EX PADS
6.0000 | MEDICATED_PAD | Freq: Every day | CUTANEOUS | Status: AC
  Administered 2013-12-01 – 2013-12-05 (×4): 6 via TOPICAL

## 2013-11-30 MED ORDER — FENTANYL CITRATE 0.05 MG/ML IJ SOLN
INTRAMUSCULAR | Status: AC
  Administered 2013-11-30: 100 ug
  Filled 2013-11-30: qty 2

## 2013-11-30 MED ORDER — CHLORHEXIDINE GLUCONATE 0.12 % MT SOLN
15.0000 mL | Freq: Two times a day (BID) | OROMUCOSAL | Status: DC
  Administered 2013-11-30 – 2013-12-12 (×24): 15 mL via OROMUCOSAL
  Filled 2013-11-30 (×24): qty 15

## 2013-11-30 MED ORDER — ETOMIDATE 2 MG/ML IV SOLN
INTRAVENOUS | Status: AC
  Filled 2013-11-30: qty 20

## 2013-11-30 MED ORDER — MIDAZOLAM HCL 2 MG/2ML IJ SOLN
2.0000 mg | Freq: Once | INTRAMUSCULAR | Status: DC
  Filled 2013-11-30: qty 2

## 2013-11-30 MED ORDER — FENTANYL CITRATE 0.05 MG/ML IJ SOLN
50.0000 ug | INTRAMUSCULAR | Status: DC | PRN
  Administered 2013-11-30 – 2013-12-01 (×10): 50 ug via INTRAVENOUS
  Filled 2013-11-30 (×8): qty 2

## 2013-11-30 MED ORDER — SUCCINYLCHOLINE CHLORIDE 20 MG/ML IJ SOLN
INTRAMUSCULAR | Status: AC
  Filled 2013-11-30: qty 1

## 2013-11-30 MED ORDER — MUPIROCIN 2 % EX OINT
1.0000 "application " | TOPICAL_OINTMENT | Freq: Two times a day (BID) | CUTANEOUS | Status: AC
  Administered 2013-11-30 – 2013-12-05 (×10): 1 via NASAL
  Filled 2013-11-30 (×2): qty 22

## 2013-11-30 MED ORDER — FENTANYL CITRATE 0.05 MG/ML IJ SOLN
INTRAMUSCULAR | Status: AC
  Filled 2013-11-30: qty 2

## 2013-11-30 MED ORDER — ALPRAZOLAM 0.5 MG PO TABS
1.0000 mg | ORAL_TABLET | Freq: Every day | ORAL | Status: DC
  Administered 2013-11-30 – 2013-12-06 (×7): 1 mg via ORAL
  Filled 2013-11-30 (×7): qty 2

## 2013-11-30 MED ORDER — FENTANYL CITRATE 0.05 MG/ML IJ SOLN: 100.0000 ug | Freq: Once | INTRAMUSCULAR | Status: DC

## 2013-11-30 NOTE — Consult Note (Signed)
Consult requested by: [facility] Consult requested for respiratory failure:  HPI: This is an 79 year old who lives at an assisted living facility and who has been having cough and congestion. She was brought to the emergency department with flulike symptoms. She was started on antibiotics because of appears to be influenza and pneumonia but has been having increasing problems with shortness of breath and hypoxia and was intubated and placed on mechanical ventilation. She now has some bright red blood per the endotracheal tube although the intubation did not appear to be traumatic. She has multiple medical problems as noted below  Past Medical History  Diagnosis Date  . GERD (gastroesophageal reflux disease)   . Colitis, ischemic   . Osteoarthritis   . Breast cancer     Right Breast - May 2013/ Left Breast - 2003  . Anxiety   . Stress incontinence, female   . Constipation - functional   . Hypertension   . Headache(784.0)   . Hx: UTI (urinary tract infection)   . HOH (hard of hearing)     both ears; refuses to wear hearing aides  . Dementia   . Benign essential tremor     left hand; intermittent  . Hyperlipidemia   . Adrenal gland hypofunction     takes cortisone daily  . S/P radiation therapy 04/15/12 -05/13/12    RIGHT BREAST  . Chronic constipation 01/04/13  . Fatigue 02/25/13    chronic  . Collar bone fracture   . Depression   . Glucocorticoid deficiency      Family History  Problem Relation Age of Onset  . Heart disease Brother   . Alcohol abuse Brother   . Alcohol abuse Brother   . Healthy Son   . Anesthesia problems Neg Hx   . Heart disease Mother   . Heart disease Father   . Dementia Mother      History   Social History  . Marital Status: Widowed    Spouse Name: N/A    Number of Children: N/A  . Years of Education: N/A   Social History Main Topics  . Smoking status: Former Smoker -- 0.50 packs/day for 20 years    Types: Cigarettes    Quit date:  09/21/1996  . Smokeless tobacco: Never Used     Comment: smoked to 3-4 cigartettes a day  . Alcohol Use: Yes     Comment: Occasional  . Drug Use: No  . Sexual Activity: None   Other Topics Concern  . None   Social History Narrative   Lives at home with her two sons who rotate care of her 3 days each.       ROS: Unobtainable    Objective: Vital signs in last 24 hours: Temp:  [99.4 F (37.4 C)-102 F (38.9 C)] 100.9 F (38.3 C) (12/31 1200) Pulse Rate:  [41-120] 98 (12/31 1000) Resp:  [17-29] 22 (12/31 1000) BP: (71-142)/(44-96) 94/45 mmHg (12/31 1000) SpO2:  [90 %-100 %] 95 % (12/31 1000) FiO2 (%):  [50 %] 50 % (12/31 0655) Weight:  [60.1 kg (132 lb 7.9 oz)] 60.1 kg (132 lb 7.9 oz) (12/30 1616) Weight change:     Intake/Output from previous day: 12/30 0701 - 12/31 0700 In: 1222.9 [I.V.:972.9; IV Piggyback:250] Out: -   PHYSICAL EXAM She is intubated and sedated. He was reactive. Nose and throat are clear. She has an endotracheal tube in place. She has some bright red blood in the suction apparatus. Her chest shows rhonchi and  some crackles bilaterally. Her heart is regular without gallop. Abdomen is soft no masses are felt. Extremities showed no edema. Central nervous system not able to be evaluated  Lab Results: Basic Metabolic Panel:  Recent Labs  47/82/95 1233 11/30/13 0236  NA 132* 135*  K 3.9 3.5*  CL 93* 95*  CO2 27 24  GLUCOSE 170* 161*  BUN 15 11  CREATININE 0.79 0.70  CALCIUM 8.9 8.8   Liver Function Tests:  Recent Labs  11/25/2013 1233 11/30/13 0236  AST 22 21  ALT 20 16  ALKPHOS 81 58  BILITOT 0.4 0.4  PROT 7.2 6.7  ALBUMIN 3.8 3.2*   No results found for this basename: LIPASE, AMYLASE,  in the last 72 hours No results found for this basename: AMMONIA,  in the last 72 hours CBC:  Recent Labs  11/01/2013 1233 11/30/13 0236  WBC 8.6 7.3  NEUTROABS 7.5 5.8  HGB 13.1 12.8  HCT 38.8 37.6  MCV 86.2 85.6  PLT 223 200   Cardiac  Enzymes:  Recent Labs  11/22/2013 1433 11/07/2013 2013 11/30/13 0236  TROPONINI <0.30 <0.30 <0.30   BNP:  Recent Labs  11/17/2013 1233  PROBNP 451.7*   D-Dimer: No results found for this basename: DDIMER,  in the last 72 hours CBG: No results found for this basename: GLUCAP,  in the last 72 hours Hemoglobin A1C: No results found for this basename: HGBA1C,  in the last 72 hours Fasting Lipid Panel: No results found for this basename: CHOL, HDL, LDLCALC, TRIG, CHOLHDL, LDLDIRECT,  in the last 72 hours Thyroid Function Tests: No results found for this basename: TSH, T4TOTAL, FREET4, T3FREE, THYROIDAB,  in the last 72 hours Anemia Panel: No results found for this basename: VITAMINB12, FOLATE, FERRITIN, TIBC, IRON, RETICCTPCT,  in the last 72 hours Coagulation: No results found for this basename: LABPROT, INR,  in the last 72 hours Urine Drug Screen: Drugs of Abuse  No results found for this basename: labopia, cocainscrnur, labbenz, amphetmu, thcu, labbarb    Alcohol Level: No results found for this basename: ETH,  in the last 72 hours Urinalysis:  Recent Labs  11/12/2013 1454  COLORURINE YELLOW  LABSPEC 1.020  PHURINE 5.5  GLUCOSEU NEGATIVE  HGBUR MODERATE*  BILIRUBINUR NEGATIVE  KETONESUR NEGATIVE  PROTEINUR NEGATIVE  UROBILINOGEN 0.2  NITRITE NEGATIVE  LEUKOCYTESUR NEGATIVE   Misc. Labs:   ABGS:  Recent Labs  11/30/13 1150  PHART 7.355  PO2ART 113.0*  TCO2 22.4  HCO3 24.7*     MICROBIOLOGY: Recent Results (from the past 240 hour(s))  CULTURE, BLOOD (ROUTINE X 2)     Status: None   Collection Time    11/21/2013  2:34 PM      Result Value Range Status   Specimen Description BLOOD RIGHT ANTECUBITAL   Final   Special Requests     Final   Value: BOTTLES DRAWN AEROBIC AND ANAEROBIC AEB=12CC ANA=10CC   Culture NO GROWTH 1 DAY   Final   Report Status PENDING   Incomplete  CULTURE, BLOOD (ROUTINE X 2)     Status: None   Collection Time    11/17/2013  2:40 PM       Result Value Range Status   Specimen Description BLOOD RIGHT HAND   Final   Special Requests BOTTLES DRAWN AEROBIC AND ANAEROBIC 5CC   Final   Culture NO GROWTH 1 DAY   Final   Report Status PENDING   Incomplete  MRSA PCR SCREENING  Status: Abnormal   Collection Time    12-18-13  4:04 PM      Result Value Range Status   MRSA by PCR POSITIVE (*) NEGATIVE Final   Comment:            The GeneXpert MRSA Assay (FDA     approved for NASAL specimens     only), is one component of a     comprehensive MRSA colonization     surveillance program. It is not     intended to diagnose MRSA     infection nor to guide or     monitor treatment for     MRSA infections.     RESULT CALLED TO, READ BACK BY AND VERIFIED WITH:     COLLINS,B. AT 1906 ON 12-18-13 BY BAUGHAM,M.    Studies/Results: Dg Chest 2 View  2013/12/18   CLINICAL DATA:  Influenza.  Weakness.  EXAM: CHEST  2 VIEW  COMPARISON:  11/26/2013 and previous  FINDINGS: There are patchy infiltrates in both lower lobes, right more than left, consistent with pneumonia. The upper lungs are clear. No effusions. Heart size is normal. There is calcification of the aorta up.  IMPRESSION: Patchy lower lobe pneumonia, right more than left.   Electronically Signed   By: Paulina Fusi M.D.   On: 2013/12/18 13:26   Dg Chest Port 1 View  11/30/2013   CLINICAL DATA:  79 year old female with respiratory failure. Initial encounter. Sepsis.  EXAM: PORTABLE CHEST - 1 VIEW  COMPARISON:  2013-12-18 and earlier.  FINDINGS: Portable AP semi upright view at 0730 hrs. Mildly progressed confluent bibasilar opacity as well as patchy and streaky right lung opacity elsewhere. Increased left lung interstitial opacity to a lesser extent. Stable cardiac size and mediastinal contours. No pneumothorax. Small veiling opacity at both lung bases suggesting small pleural effusions.  IMPRESSION: Progressed basilar predominant opacity in both lungs most suggestive of widespread  lung infection.   Electronically Signed   By: Augusto Gamble M.D.   On: 11/30/2013 08:00   Dg Chest Port 1v Same Day  11/30/2013   CLINICAL DATA:  Intubation.  EXAM: PORTABLE CHEST - 1 VIEW SAME DAY  COMPARISON:  11/30/2013  FINDINGS: Endotracheal tube is approximately 3 cm above the carina. Left central line tip is in the SVC. NG tube enters the stomach. No pneumothorax.  There is hyperinflation of the lungs compatible with COPD. Cardiomegaly. Bilateral interstitial opacities. Increasing bibasilar atelectasis or infiltrates.  IMPRESSION: Interval intubation with endotracheal tube 3 cm above the carina. Left central line tip in the SVC. No pneumothorax.  Continued diffuse interstitial opacities. Increasing focal airspace opacities in the lower lobes.   Electronically Signed   By: Charlett Nose M.D.   On: 11/30/2013 13:27    Medications:  Prior to Admission:  Prescriptions prior to admission  Medication Sig Dispense Refill  . ALPRAZolam (XANAX) 0.5 MG tablet Take 0.5-1 mg by mouth 2 (two) times daily. One tablet in the morning and 2 at bedtime      . amLODipine (NORVASC) 2.5 MG tablet Take 2.5 mg by mouth daily.      Marland Kitchen aspirin EC 81 MG tablet Take 81 mg by mouth daily.      . Aspirin-Acetaminophen-Caffeine (GOODY HEADACHE PO) Take 1 packet by mouth daily as needed (headaches).      . benzonatate (TESSALON) 100 MG capsule Take 100 mg by mouth 3 (three) times daily as needed for cough.      . docusate  sodium (COLACE) 100 MG capsule Take 100 mg by mouth 2 (two) times daily.       . DULoxetine (CYMBALTA) 30 MG capsule Take 30 mg by mouth daily.      Marland Kitchen HYDROcodone-acetaminophen (NORCO) 10-325 MG per tablet Take 1-2 tablets by mouth every 6 (six) hours as needed for moderate pain.      . hydrocortisone (CORTEF) 20 MG tablet Take 10 mg by mouth daily.       . Loteprednol Etabonate (LOTEMAX) 0.5 % GEL Apply 1 drop to eye 3 (three) times daily.      Marland Kitchen lubiprostone (AMITIZA) 24 MCG capsule Take 24 mcg by mouth  daily with breakfast.       . Multiple Vitamin (DAILY VITE PO) Take 1 tablet by mouth daily.      . pravastatin (PRAVACHOL) 80 MG tablet Take 1 tablet (80 mg total) by mouth daily.  30 tablet  0  . psyllium (METAMUCIL) 58.6 % packet Take 1 packet by mouth daily.       Scheduled: . acetaminophen  1,000 mg Intravenous Q6H  . ALPRAZolam  0.5 mg Oral TID WC   And  . ALPRAZolam  1 mg Oral QHS  . ceFEPime (MAXIPIME) IV  1 g Intravenous Q24H  . docusate sodium  100 mg Oral BID  . DULoxetine  30 mg Oral Daily  . fentaNYL      . fentaNYL  100 mcg Intravenous Once  . heparin  5,000 Units Subcutaneous Q8H  . ipratropium-albuterol  3 mL Nebulization Q6H  . lubiprostone  24 mcg Oral Q breakfast  . midazolam      . midazolam  2 mg Intravenous Once  . oseltamivir  75 mg Oral BID  . sodium chloride  500 mL Intravenous Once  . sodium chloride  10-40 mL Intracatheter Q12H  . vancomycin  750 mg Intravenous Q24H   Continuous: . sodium chloride 50 mL/hr at 11/30/13 1100   ZOX:WRUEAVWU, fentaNYL, morphine injection, sodium chloride  Assesment: She has healthcare associated pneumonia. She has influenza. She is septic with septic shock and now has acute respiratory failure. She has been appropriately intubated and started on mechanical ventilation. She has baseline dementia which makes it a bit more difficult to evaluate her Principal Problem:   Severe sepsis with septic shock Active Problems:   DCIS (ductal carcinoma in situ) of breast   Hypoadrenalism   Hypertension   Hyperlipidemia   Dementia   Metabolic encephalopathy   Acute on chronic kidney disease, stage 3   HCAP (healthcare-associated pneumonia)    Plan: Continue her ventilator support. She is on appropriate antibiotics. She is scheduled for CT of the abdomen and pelvis and because of the blood in her endotracheal tube I have her get CT of the chest as well    LOS: 1 day   Jonalyn Sedlak L 11/30/2013, 1:43 PM

## 2013-11-30 NOTE — Progress Notes (Signed)
INITIAL NUTRITION ASSESSMENT  DOCUMENTATION CODES Per approved criteria  -Not Applicable   INTERVENTION: Recommend if pt is expected to be on vent support >24-48 hr to initiate Oxepa @ 20 ml/hr via OGT and increase by 10 ml after 4 hrs hours to goal rate of 30 ml/hr. Add 30 ml Prostat TID.  At goal rate, tube feeding regimen will provide 1380 kcal, 90 grams of  protein, and 565 ml of H2O.   NUTRITION DIAGNOSIS: Inadequate oral intake related to inability to eat as evidenced by mechanical ventilation requiring NPO status.  Goal: Pt to meet >/= 90% of their estimated nutrition needs   Monitor:  Respiratory status, nutrition provision, weights and labs  Reason for Assessment: Mechanical intubation  79 y.o. female  Admitting Dx: Severe sepsis with septic shock  ASSESSMENT: Patient is currently intubated on ventilator support @ 1222 today due to increased shortness of breath and hypoxia.  MV: 6.1 L/min Temp (24hrs), Avg:100.5 F (38.1 C), Min:99.4 F (37.4 C), Max:102 F (38.9 C)  Propofol:  n/a  Height: Ht Readings from Last 1 Encounters:  12/22/2013 5\' 2"  (1.575 m)    Weight: Wt Readings from Last 1 Encounters:  12-22-13 132 lb 7.9 oz (60.1 kg)    Ideal Body Weight: 110# (50 kg)  % Ideal Body Weight: 120%  Wt Readings from Last 10 Encounters:  December 22, 2013 132 lb 7.9 oz (60.1 kg)  11/26/13 110 lb (49.896 kg)  10/24/13 115 lb (52.164 kg)  09/08/13 115 lb (52.164 kg)  08/24/13 129 lb 3 oz (58.6 kg)  08/09/13 130 lb (58.968 kg)  07/26/13 130 lb 4.8 oz (59.104 kg)  04/05/13 121 lb 12.8 oz (55.248 kg)  03/01/13 126 lb 1.6 oz (57.199 kg)  02/25/13 126 lb 3.2 oz (57.244 kg)    Usual Body Weight: 125-130#  % Usual Body Weight: 101%  BMI:  Body mass index is 24.23 kg/(m^2). normal range  Estimated Nutritional Needs: Kcal: 1312 Protein: 84-96 Fluid: 1800 ml/day (normal needs)  Skin: intact  Diet Order: NPO  EDUCATION NEEDS: -No education needs identified at  this time   Intake/Output Summary (Last 24 hours) at 11/30/13 1532 Last data filed at 11/30/13 1400  Gross per 24 hour  Intake 1617.92 ml  Output    250 ml  Net 1367.92 ml    Last BM: PTA  Labs:   Recent Labs Lab 12/22/2013 1233 11/30/13 0236  NA 132* 135*  K 3.9 3.5*  CL 93* 95*  CO2 27 24  BUN 15 11  CREATININE 0.79 0.70  CALCIUM 8.9 8.8  GLUCOSE 170* 161*    CBG (last 3)  No results found for this basename: GLUCAP,  in the last 72 hours  Scheduled Meds: . ALPRAZolam  0.5 mg Oral TID WC   And  . ALPRAZolam  1 mg Oral QHS  . antiseptic oral rinse  15 mL Mouth Rinse QID  . ceFEPime (MAXIPIME) IV  1 g Intravenous Q24H  . chlorhexidine  15 mL Mouth Rinse BID  . docusate sodium  100 mg Oral BID  . DULoxetine  30 mg Oral Daily  . fentaNYL      . fentaNYL  100 mcg Intravenous Once  . heparin  5,000 Units Subcutaneous Q8H  . ipratropium-albuterol  3 mL Nebulization Q6H  . ketorolac  15 mg Intravenous Q6H  . lubiprostone  24 mcg Oral Q breakfast  . midazolam      . midazolam  2 mg Intravenous Once  . oseltamivir  75 mg Oral BID  . pantoprazole (PROTONIX) IV  40 mg Intravenous Daily  . sodium chloride  500 mL Intravenous Once  . sodium chloride  10-40 mL Intracatheter Q12H  . vancomycin  750 mg Intravenous Q24H    Continuous Infusions: . sodium chloride 50 mL/hr at 11/30/13 1400    Past Medical History  Diagnosis Date  . GERD (gastroesophageal reflux disease)   . Colitis, ischemic   . Osteoarthritis   . Breast cancer     Right Breast - May 2013/ Left Breast - 2003  . Anxiety   . Stress incontinence, female   . Constipation - functional   . Hypertension   . Headache(784.0)   . Hx: UTI (urinary tract infection)   . HOH (hard of hearing)     both ears; refuses to wear hearing aides  . Dementia   . Benign essential tremor     left hand; intermittent  . Hyperlipidemia   . Adrenal gland hypofunction     takes cortisone daily  . S/P radiation therapy  04/15/12 -05/13/12    RIGHT BREAST  . Chronic constipation 01/04/13  . Fatigue 02/25/13    chronic  . Collar bone fracture   . Depression   . Glucocorticoid deficiency     Past Surgical History  Procedure Laterality Date  . Tonsillectomy      At age 14  . Lumbar laminectomy    . Abdominal hysterectomy    . Bunionectomy      bilateral  . Rotor cuff      Right Shoulder  . Upper gastrointestinal endoscopy    . Kidney stone    . Knee surgery  1987    L  . Foot surgery      left foot 1994, right foot 1995  . Lumpectomy  2003  . Eye surgery  2005  . Back surgery    . Breast surgery  2003    left breast  . Tonsillectomy    . Breast surgery  2013    Royann Shivers MS,RD,CSG,LDN Office: #[phone] Pager: [phone]

## 2013-11-30 NOTE — Progress Notes (Signed)
MD paged. Pt moaning in pain and rubbing right upper quadrant of abdomen. HR elevated to 130's. RR also elevated in the 30's. Pt restless saying "I need to get up." Unable to console pt.  Pt placed on non-breather at about 1140. RR in 30's. And sats 85% on 50% Veni mask. MD at bedside  IV morphine given.  New orders. Will continue to monitor.

## 2013-11-30 NOTE — Clinical Social Work Psychosocial (Signed)
Clinical Social Work Department BRIEF PSYCHOSOCIAL ASSESSMENT 11/30/2013  Patient:  Bonnie [name], Bonnie [name]     Account Number:  [id number]     Admit date:  11/25/2013  Clinical Social Worker:  Nancie Neas  Date/Time:  11/30/2013 10:10 AM  Referred by:  Physician  Date Referred:  11/30/2013 Referred for  ALF Placement   Other Referral:   Interview type:  Family Other interview type:   [name]- son    PSYCHOSOCIAL DATA Living Status:  FACILITY Admitted from facility:  [facility] Level of care:  Assisted Living Primary support name:  [name] Primary support relationship to patient:  CHILD, ADULT Degree of support available:   supportive    CURRENT CONCERNS Current Concerns  Post-Acute Placement   Other Concerns:    SOCIAL WORK ASSESSMENT / PLAN CSW spoke with pt's son [name] on phone as pt is not oriented. Pt known to CSW from previous admission. Pt d/c home as bed was not available at [facility] at the time. They have since been able to place her there on memory care unit and she has been at [facility] for several months. Pt has advance dementia. Family appears to be involved and supportive. [name] indicates they visit regularly and take pt out on occasion. [name] has recently had a knee replacement and is unable to drive for another week. They have been very pleased with [facility] and request return when stable. Per Whitney at facility, pt is a limited assist. She ambulates independently. Pt admitted with pneumonia and also tested positive for influenza. Facility aware. Okay for return at d/c.   Assessment/plan status:  Psychosocial Support/Ongoing Assessment of Needs Other assessment/ plan:   Information/referral to community resources:   [employer]    PATIENT'S/FAMILY'S RESPONSE TO PLAN OF CARE: Pt unable to discuss plan of care at this time. Family report very positive feelings regarding return to [facility] when medically stable. CSW will  continue to follow.       [name], [place] [phone]

## 2013-11-30 NOTE — Procedures (Signed)
Intubation Procedure Note DAMARIZ PAGANELLI [id number] January 22, 1927  Procedure: Intubation Indications: Airway protection and maintenance  Procedure Details Consent: Risks of procedure as well as the alternatives and risks of each were explained to the (patient/caregiver).  Consent for procedure obtained. Time Out: Verified patient identification, verified procedure, site/side was marked, verified correct patient position, special equipment/implants available, medications/allergies/relevent history reviewed, required imaging and test results available.  Performed  Maximum sterile technique was used including gloves, gown, hand hygiene and mask.  MAC and 3    Evaluation Hemodynamic Status: BP stable throughout; O2 sats: stable throughout Patient's Current Condition: stable Complications: No apparent complications Patient did tolerate procedure well. Chest X-ray ordered to verify placement.  CXR: pending.   Katheren Shams 11/30/2013

## 2013-11-30 NOTE — Procedures (Signed)
Central Venous Catheter Insertion Procedure Note JAKEIA CARRERAS [id number] 07/13/1927  Procedure: Insertion of Central Venous Catheter Indications: Assessment of intravascular volume, Drug and/or fluid administration and Frequent blood sampling  Procedure Details Consent: Risks of procedure as well as the alternatives and risks of each were explained to the (patient/caregiver).  Consent for procedure obtained. Time Out: Verified patient identification, verified procedure, site/side was marked, verified correct patient position, special equipment/implants available, medications/allergies/relevent history reviewed, required imaging and test results available.  Performed  Maximum sterile technique was used including antiseptics, cap, gloves, gown, hand hygiene, mask and sheet. Skin prep: Chlorhexidine; local anesthetic administered A antimicrobial bonded/coated triple lumen catheter was placed in the left subclavian vein using the Seldinger technique.  Evaluation Blood flow good Complications: No apparent complications Patient did tolerate procedure well. Chest X-ray ordered to verify placement.  CXR: pending.  Jaceyon Strole A 11/30/2013, 12:59 PM

## 2013-11-30 NOTE — Care Management Note (Addendum)
    Page 1 of 1   12/05/2013     2:28:21 PM   CARE MANAGEMENT NOTE 12/05/2013  Patient:  Bonnie [name], Bonnie [name]   Account Number:  [id number]  Date Initiated:  11/30/2013  Documentation initiated by:  Sharrie Rothman  Subjective/Objective Assessment:   [facility] with respiratory failure due to flu. Pt will return to facility when discharged.     Action/Plan:   CSW to arrange discharge to facility when medically stable.   Anticipated DC Date:  12/02/2013   Anticipated DC Plan:  ASSISTED LIVING / REST HOME  In-house referral  Clinical Social Worker      DC Planning Services  CM consult      Choice offered to / List presented to:             Status of service:  Completed, signed off Medicare Important Message given?   (If response is "NO", the following Medicare IM given date fields will be blank) Date Medicare IM given:   Date Additional Medicare IM given:    Discharge Disposition:  ASSISTED LIVING  Per UR Regulation:    If discussed at Long Length of Stay Meetings, dates discussed:    Comments:  12/05/13 1430 Arlyss Queen, RN BSN CM Pt currently on ventilator and slow weaning to extubation. Possible LTAC candidate. Will discuss with MD.  11/30/13 1145 Arlyss Queen, RN BSN CM

## 2013-11-30 NOTE — Significant Event (Signed)
Patient hypoxic, tachy to 150's and having abd pain H/o limited by dementia and acute met encephlaopathy Exam abd reveals diffuse abd tenderness, no organomegally CT abd STAT ordered.  PCT noted 12.3.  DDx=Additional Abd process [sepsis vs colitis vs ischemic bowel ?] in addition to severe sepsis/septic shock from Pneumonia. If continues to deteriorate, might need advanced airway placement to manage. Will update family again.  Pleas Koch, MD [facility] [phone]

## 2013-11-30 NOTE — Progress Notes (Signed)
Bonnie [name] UJW:[id number] DOB: 1927/07/08 DOA: 07-Dec-2013 PCP: Cassell Smiles., MD  Brief narrative: 79 y.o. female none HTN, HLD, dementia, chronic constipation and diarrhea, adrenal insufficiency, acid reflux, breast cancer currently in remission and last radiation treatment approximately 9 months ago, anxiety and depression who presents with altered mental status  came to [facility] ed from [facility] 2013-12-07 with flu-like symptoms.  She was most recently seen 12/27 in the emergency room where shewas noted to have a nonproductive cough temperature 99.1 and triaged home as chest x-ray was negative. She returned to the emergency room 12/30 and was found to have widespread pneumonia, positive H. Influenza She was started on broad-spectrum antibiotic as well as Tamiflu-BNP was 451, lactic acid 1.1 Unfortunately she had significant confusion and has a baseline of dementia  Past medical history-As per Problem list Chart reviewed as below-  Consultants:  None  Procedures:  Chest x-ray 12/30  Chest x-ray 12/31  Antibiotics:  Cefepime 12/30  Vancomycin 12/30  Tamiflu 12/30  Blood cultures pending  Strep pneumo negative  Legionella pending  HIV negative   Subjective  Work of breathing is worse. Trial weaning from 100% non-rebreathing, significant tachypneic Workup breathing increased at rest, 30 times a minute Is pending Is a little more oriented today however    Objective    Interim History: See above  Telemetry: Sinus tachycardia   Objective: Filed Vitals:   11/30/13 0400 11/30/13 0500 11/30/13 0600 11/30/13 0655  BP: 96/47 119/56 99/84   Pulse: 88 90 101   Temp: 99.4 F (37.4 C)     TempSrc: Oral     Resp: 21 22 29    Height:      Weight:      SpO2: 93% 93% 96% 92%    Intake/Output Summary (Last 24 hours) at 11/30/13 5621 Last data filed at 11/30/13 0400  Gross per 24 hour  Intake 1087.5 ml  Output      0 ml  Net 1087.5 ml     Exam:  General: EOMI, NCAT Cardiovascular: S1-S2 tachycardic bounding pulses no JVD Respiratory: Increased work of breathing, accessory muscles in use Abdomen: Soft nontender nondistended Skin no lower extremity edema Neuro confused moving all 4 limbs however  Data Reviewed: Basic Metabolic Panel:  Recent Labs Lab 12-07-13 1233 11/30/13 0236  NA 132* 135*  K 3.9 3.5*  CL 93* 95*  CO2 27 24  GLUCOSE 170* 161*  BUN 15 11  CREATININE 0.79 0.70  CALCIUM 8.9 8.8   Liver Function Tests:  Recent Labs Lab December 07, 2013 1233 11/30/13 0236  AST 22 21  ALT 20 16  ALKPHOS 81 58  BILITOT 0.4 0.4  PROT 7.2 6.7  ALBUMIN 3.8 3.2*   No results found for this basename: LIPASE, AMYLASE,  in the last 168 hours No results found for this basename: AMMONIA,  in the last 168 hours CBC:  Recent Labs Lab 2013-12-07 1233 11/30/13 0236  WBC 8.6 7.3  NEUTROABS 7.5 5.8  HGB 13.1 12.8  HCT 38.8 37.6  MCV 86.2 85.6  PLT 223 200   Cardiac Enzymes:  Recent Labs Lab 07-Dec-2013 1433 12/07/13 2013 11/30/13 0236  TROPONINI <0.30 <0.30 <0.30   BNP: No components found with this basename: POCBNP,  CBG: No results found for this basename: GLUCAP,  in the last 168 hours  Recent Results (from the past 240 hour(s))  MRSA PCR SCREENING     Status: Abnormal   Collection Time    12-07-2013  4:04 PM  Result Value Range Status   MRSA by PCR POSITIVE (*) NEGATIVE Final   Comment:            The GeneXpert MRSA Assay (FDA     approved for NASAL specimens     only), is one component of a     comprehensive MRSA colonization     surveillance program. It is not     intended to diagnose MRSA     infection nor to guide or     monitor treatment for     MRSA infections.     RESULT CALLED TO, READ BACK BY AND VERIFIED WITH:     COLLINS,B. AT 1906 ON 11/02/2013 BY BAUGHAM,M.     Studies:              All Imaging reviewed and is as per above notation   Scheduled Meds: . ALPRAZolam  0.5 mg  Oral Daily   And  . ALPRAZolam  1 mg Oral QHS  . aspirin EC  81 mg Oral Daily  . ceFEPime (MAXIPIME) IV  1 g Intravenous Q24H  . docusate sodium  100 mg Oral BID  . DULoxetine  30 mg Oral Daily  . heparin  5,000 Units Subcutaneous Q8H  . ipratropium-albuterol  3 mL Nebulization Q6H  . lubiprostone  24 mcg Oral Q breakfast  . oseltamivir  75 mg Oral BID  . sodium chloride  500 mL Intravenous Once  . vancomycin  750 mg Intravenous Q24H   Continuous Infusions: . sodium chloride 125 mL/hr at 11/30/13 0555     1.       Acute type I respiratory failure-ABG = PaO2 of 60-repeat ABG 12/131 7.43/36/97 on 50% NRB. Add anxiolytic for now.  Rpt Gas if clinically still inc WOB on reassesment this Pm 2. Sepsis-Early, H Flu+ secondary bacterial pneumonia. Obtain sputum cultures given this is the most sensitive to determine pathogen. Blood cultures p. Continue HCAP coverage started in the emergency room. Cut 125 cc per hour x12 hours-->50 cc./hr 12/31. Repeat labs in a.m.-continue stress dose steroids 80 mg Solu-Medrol every 8 hourly for the next 1-2 days despite conflicting evidence per critical care guidelines.  3. H. Influenza positive-continue Tamiflu at this stage 4. Acute metabolic encephalopathy-likely secondary to hypoxia/contributor = stage III to 5 dementia. If persists, will obtain ammonia. Seems clearer today 5. DCIS (ductal carcinoma in situ) of breast-Stable at present outpatient followup. If no improvement consider CT scan chest and discussion with family  6. Hypoadrenalism-see above  7. Hypertension-actually hypertensive at present and tachycardic denoting early sepsis.  amlodipine low-dose 2.5 mg discontinued 8. Hyperlipidemia-statin on hold  9. Dementia-at least stage III or 4. Will discuss further with son in the morning. She comes [facility] the [facility] [place]  10. Metabolic encephalopathy-see above  11. Acute on chronic kidney disease, stage 3-normal  12. Elevated  proBNP-unclear how to interpret lab value of 451 in a setting of sepsis. Would hold off on Lasix/repeat echocardiogram as this was done 08/26/13   Code Status: Full Family Communication: none at bedside.  called [name] on [c] [phone], 10 min discussion Disposition Plan: sdu   >45  Pleas Koch, MD  [facility] Pager [phone] 11/30/2013, 7:12 AM    LOS: 1 day

## 2013-11-30 NOTE — Progress Notes (Signed)
MD at bedside. Pt HR continues to be 130-150's, RR in the 30's.  MD order to intubate. RT and anesthesia made aware. Pt intubated at 1232 by Dr Franky Macho. Dr Lovell Sheehan also placed a central line in left subclavian. Pt tolerated both procedures well.   Pts son at bedside. Support given.  Per MD order RT placed ART line. Pt tolearted procedure well.   Will continue to monitor.

## 2013-11-30 NOTE — Significant Event (Addendum)
Procedure Name: MAC Date/Time: 11/30/2013 12:51 PM Performed by: Rhetta Mura Pre-anesthesia Checklist: Patient identified, Emergency Drugs available, Timeout performed and Patient being monitored Patient Re-evaluated:Patient Re-evaluated prior to inductionOxygen Delivery Method: Non-rebreather mask Preoxygenation: Pre-oxygenation with 100% oxygen Intubation Type: IV induction, Cricoid Pressure applied and Rapid sequence Ventilation: Mask ventilation without difficulty Laryngoscope Size: Mac Grade View: Grade II Tube type: Subglottic suction tube Tube size: 7.0 mm Number of attempts: 2 Airway Equipment and Method: Stylet Placement Confirmation: ETT inserted through vocal cords under direct vision and positive ETCO2 Secured at: 22 cm Tube secured with: Tape Dental Injury: Teeth and Oropharynx as per pre-operative assessment  Difficulty Due To: Difficult Airway- due to large tongue Comments: Intubation attempted x 1 by myself Subsequently, Dr. Lovell Sheehan successfully intubated the patientn @ 12:22 pm

## 2013-12-01 ENCOUNTER — Inpatient Hospital Stay (HOSPITAL_COMMUNITY): Payer: Medicare Other

## 2013-12-01 LAB — COMPREHENSIVE METABOLIC PANEL
ALK PHOS: 52 U/L (ref 39–117)
ALT: 11 U/L (ref 0–35)
ALT: 11 U/L (ref 0–35)
AST: 16 U/L (ref 0–37)
AST: 14 U/L (ref 0–37)
Albumin: 2.3 g/dL — ABNORMAL LOW (ref 3.5–5.2)
Albumin: 2.3 g/dL — ABNORMAL LOW (ref 3.5–5.2)
Alkaline Phosphatase: 55 U/L (ref 39–117)
BILIRUBIN TOTAL: 0.5 mg/dL (ref 0.3–1.2)
BUN: 24 mg/dL — ABNORMAL HIGH (ref 6–23)
BUN: 22 mg/dL (ref 6–23)
CHLORIDE: 103 meq/L (ref 96–112)
CO2: 24 mEq/L (ref 19–32)
CO2: 25 mEq/L (ref 19–32)
Calcium: 8.3 mg/dL — ABNORMAL LOW (ref 8.4–10.5)
Calcium: 8.3 mg/dL — ABNORMAL LOW (ref 8.4–10.5)
Chloride: 101 mEq/L (ref 96–112)
Creatinine, Ser: 0.91 mg/dL (ref 0.50–1.10)
Creatinine, Ser: 0.76 mg/dL (ref 0.50–1.10)
GFR calc Af Amer: 65 mL/min — ABNORMAL LOW (ref >90)
GFR calc Af Amer: 87 mL/min — ABNORMAL LOW (ref >90)
GFR calc non Af Amer: 56 mL/min — ABNORMAL LOW (ref >90)
GFR, EST NON AFRICAN AMERICAN: 75 mL/min — AB (ref >90)
Glucose, Bld: 123 mg/dL — ABNORMAL HIGH (ref 70–99)
Glucose, Bld: 122 mg/dL — ABNORMAL HIGH (ref 70–99)
POTASSIUM: 3.7 meq/L (ref 3.7–5.3)
Potassium: 3.7 mEq/L (ref 3.7–5.3)
Sodium: 139 mEq/L (ref 137–147)
Sodium: 137 mEq/L (ref 137–147)
Total Bilirubin: 0.5 mg/dL (ref 0.3–1.2)
Total Protein: 5.5 g/dL — ABNORMAL LOW (ref 6.0–8.3)
Total Protein: 5.4 g/dL — ABNORMAL LOW (ref 6.0–8.3)

## 2013-12-01 LAB — BLOOD GAS, ARTERIAL
Acid-base deficit: 1.2 mmol/L (ref 0.0–2.0)
Acid-base deficit: 1.6 mmol/L (ref 0.0–2.0)
BICARBONATE: 21.8 meq/L (ref 20.0–24.0)
Bicarbonate: 22.6 mEq/L (ref 20.0–24.0)
Drawn by: 317771
FIO2: 0.4 %
FIO2: 40 %
MECHVT: 450 mL
MECHVT: 450 mL
O2 Saturation: 97.6 %
O2 Saturation: 98.9 %
PCO2 ART: 32.2 mmHg — AB (ref 35.0–45.0)
PEEP: 5 cmH2O
PEEP: 5 cmH2O
PH ART: 7.447 (ref 7.350–7.450)
PO2 ART: 133 mmHg — AB (ref 80.0–100.0)
Patient temperature: 37.9
Patient temperature: 37
RATE: 12 resp/min
RATE: 12 resp/min
TCO2: 19.7 mmol/L (ref 0–100)
TCO2: 20 mmol/L (ref 0–100)
pCO2 arterial: 35.2 mmHg (ref 35.0–45.0)
pH, Arterial: 7.423 (ref 7.350–7.450)
pO2, Arterial: 97.2 mmHg (ref 80.0–100.0)

## 2013-12-01 LAB — LACTIC ACID, PLASMA: Lactic Acid, Venous: 1.5 mmol/L (ref 0.5–2.2)

## 2013-12-01 LAB — CBC WITH DIFFERENTIAL/PLATELET
Basophils Absolute: 0 10*3/uL (ref 0.0–0.1)
Basophils Relative: 0 % (ref 0–1)
Eosinophils Absolute: 0 10*3/uL (ref 0.0–0.7)
Eosinophils Relative: 0 % (ref 0–5)
HCT: 29.7 % — ABNORMAL LOW (ref 36.0–46.0)
Hemoglobin: 10 g/dL — ABNORMAL LOW (ref 12.0–15.0)
Lymphocytes Relative: 5 % — ABNORMAL LOW (ref 12–46)
Lymphs Abs: 0.3 10*3/uL — ABNORMAL LOW (ref 0.7–4.0)
MCH: 29.2 pg (ref 26.0–34.0)
MCHC: 33.7 g/dL (ref 30.0–36.0)
MCV: 86.6 fL (ref 78.0–100.0)
Monocytes Absolute: 0.4 10*3/uL (ref 0.1–1.0)
Monocytes Relative: 7 % (ref 3–12)
Neutro Abs: 5.3 10*3/uL (ref 1.7–7.7)
Neutrophils Relative %: 88 % — ABNORMAL HIGH (ref 43–77)
Platelets: 200 10*3/uL (ref 150–400)
RBC: 3.43 MIL/uL — ABNORMAL LOW (ref 3.87–5.11)
RDW: 14.2 % (ref 11.5–15.5)
WBC: 6 10*3/uL (ref 4.0–10.5)

## 2013-12-01 LAB — PROCALCITONIN: Procalcitonin: 34.37 ng/mL

## 2013-12-01 LAB — OCCULT BLOOD X 1 CARD TO LAB, STOOL: FECAL OCCULT BLD: NEGATIVE

## 2013-12-01 MED ORDER — PIPERACILLIN-TAZOBACTAM 3.375 G IVPB
3.3750 g | Freq: Three times a day (TID) | INTRAVENOUS | Status: DC
  Administered 2013-12-01 – 2013-12-04 (×8): 3.375 g via INTRAVENOUS
  Filled 2013-12-01 (×13): qty 50

## 2013-12-01 MED ORDER — FENTANYL CITRATE 0.05 MG/ML IJ SOLN
INTRAMUSCULAR | Status: AC
  Filled 2013-12-01: qty 50

## 2013-12-01 MED ORDER — SODIUM CHLORIDE 0.9 % IV BOLUS (SEPSIS)
1000.0000 mL | Freq: Once | INTRAVENOUS | Status: AC
  Administered 2013-12-01: 1000 mL via INTRAVENOUS

## 2013-12-01 MED ORDER — FENTANYL BOLUS VIA INFUSION
25.0000 ug | INTRAVENOUS | Status: DC | PRN
  Filled 2013-12-01: qty 50

## 2013-12-01 MED ORDER — MIDAZOLAM HCL 10 MG/2ML IJ SOLN
1.0000 mg | INTRAMUSCULAR | Status: DC | PRN
  Administered 2013-12-01 (×3): 1 mg via INTRAVENOUS
  Filled 2013-12-01 (×3): qty 2

## 2013-12-01 MED ORDER — FENTANYL CITRATE 0.05 MG/ML IJ SOLN: 50.0000 ug | Freq: Once | INTRAMUSCULAR | Status: DC

## 2013-12-01 MED ORDER — SODIUM CHLORIDE 0.9 % IV SOLN
0.0000 ug/h | INTRAVENOUS | Status: DC
  Administered 2013-12-01: 50 ug/h via INTRAVENOUS
  Administered 2013-12-02: 100 ug/h via INTRAVENOUS
  Administered 2013-12-03 – 2013-12-04 (×3): 150 ug/h via INTRAVENOUS
  Filled 2013-12-01 (×3): qty 50

## 2013-12-01 NOTE — Progress Notes (Signed)
Pt very restless and agitated. HR 130-140's with RR in the 30's. Pt had prn doses of Fentanyl and Versed with no relief. Order received to start continuous Fentanyl infusion.   Will continue to monitor.

## 2013-12-01 NOTE — Progress Notes (Signed)
MD made aware of drop in Hgb from 12.8 to 10 this AM via text page.

## 2013-12-01 NOTE — Significant Event (Signed)
[facility] follow-up progress note (same day)  Agitated.  SVT 170, HTN 200/100.  Not approp for trial wean to CPAP currently.  Rpt Arterial Blood Gas result:  pO2 133; pCO2 32.2; pH 7.447=improvement   Intake/Output Summary (Last 24 hours) at 12/01/13 1554 Last data filed at 12/01/13 0600  Gross per 24 hour  Intake    900 ml  Output    700 ml  Net    200 ml    A/p Start Fentanyl gtt for agitation If SVT persists, EKG and discussion with CCM/Cardiology Will trial Wean am Bonnie [name] Son [phone] update

## 2013-12-01 NOTE — Progress Notes (Signed)
Planned to wean patient this afternoon (per hospitilist request) however patient was extremely agitated, HR was in the 140's, and RN suctioned large amounts of  frothy pink secretions  Wean was not attempted, plan to wean in am if appropriate.

## 2013-12-01 NOTE — Progress Notes (Signed)
ANTIBIOTIC CONSULT NOTE - follow up  Pharmacy Consult for Vancomycin & Zosyn Indication: Pneumonia  Allergies  Allergen Reactions  . Macrodantin Nausea Only    It also causes fever   Patient Measurements: Height: 5\' 2"  (157.5 cm) Weight: 133 lb 9.6 oz (60.6 kg) IBW/kg (Calculated) : 50.1  Vital Signs: Temp: 100.9 [name] (38.3 C) (01/01 0400) Temp src: Axillary (01/01 0400) BP: 115/79 mmHg (01/01 0600) Pulse Rate: 99 (01/01 0600) Intake/Output from previous day: 12/31 0701 - 01/01 0700 In: 1645 [P.O.:150; I.V.:1295; IV Piggyback:200] Out: 950 [Urine:950] Intake/Output from this shift:    Labs:  Recent Labs  11/28/2013 1233 11/30/13 0236 12/01/13 0440  WBC 8.6 7.3 6.0  HGB 13.1 12.8 10.0*  PLT 223 200 200  CREATININE 0.79 0.70 0.91   Estimated Creatinine Clearance: 39.4 ml/min (by C-G formula based on Cr of 0.91). No results found for this basename: VANCOTROUGH, Corlis Leak, VANCORANDOM, GENTTROUGH, GENTPEAK, GENTRANDOM, TOBRATROUGH, TOBRAPEAK, TOBRARND, AMIKACINPEAK, AMIKACINTROU, AMIKACIN,  in the last 72 hours   Microbiology: Recent Results (from the past 720 hour(s))  CULTURE, BLOOD (ROUTINE X 2)     Status: None   Collection Time    11/04/2013  2:34 PM      Result Value Range Status   Specimen Description BLOOD RIGHT ANTECUBITAL   Final   Special Requests     Final   Value: BOTTLES DRAWN AEROBIC AND ANAEROBIC AEB=12CC ANA=10CC   Culture NO GROWTH 1 DAY   Final   Report Status PENDING   Incomplete  CULTURE, BLOOD (ROUTINE X 2)     Status: None   Collection Time    11/26/2013  2:40 PM      Result Value Range Status   Specimen Description BLOOD RIGHT HAND   Final   Special Requests BOTTLES DRAWN AEROBIC AND ANAEROBIC 5CC   Final   Culture NO GROWTH 1 DAY   Final   Report Status PENDING   Incomplete  MRSA PCR SCREENING     Status: Abnormal   Collection Time    11/12/2013  4:04 PM      Result Value Range Status   MRSA by PCR POSITIVE (*) NEGATIVE Final   Comment:             The GeneXpert MRSA Assay (FDA     approved for NASAL specimens     only), is one component of a     comprehensive MRSA colonization     surveillance program. It is not     intended to diagnose MRSA     infection nor to guide or     monitor treatment for     MRSA infections.     RESULT CALLED TO, READ BACK BY AND VERIFIED WITH:     COLLINS,B. AT 1906 ON 11/18/2013 BY BAUGHAM,M.   Medical History: Past Medical History  Diagnosis Date  . GERD (gastroesophageal reflux disease)   . Colitis, ischemic   . Osteoarthritis   . Breast cancer     Right Breast - May 2013/ Left Breast - 2003  . Anxiety   . Stress incontinence, female   . Constipation - functional   . Hypertension   . Headache(784.0)   . Hx: UTI (urinary tract infection)   . HOH (hard of hearing)     both ears; refuses to wear hearing aides  . Dementia   . Benign essential tremor     left hand; intermittent  . Hyperlipidemia   . Adrenal gland hypofunction  takes cortisone daily  . S/P radiation therapy 04/15/12 -05/13/12    RIGHT BREAST  . Chronic constipation 01/04/13  . Fatigue 02/25/13    chronic  . Collar bone fracture   . Depression   . Glucocorticoid deficiency    Medications:  Scheduled:  . ALPRAZolam  0.5 mg Oral TID WC   And  . ALPRAZolam  1 mg Oral QHS  . antiseptic oral rinse  15 mL Mouth Rinse QID  . chlorhexidine  15 mL Mouth Rinse BID  . Chlorhexidine Gluconate Cloth  6 each Topical Q0600  . docusate sodium  100 mg Oral BID  . DULoxetine  30 mg Oral Daily  . fentaNYL  100 mcg Intravenous Once  . heparin  5,000 Units Subcutaneous Q8H  . ipratropium-albuterol  3 mL Nebulization Q6H  . ketorolac  15 mg Intravenous Q6H  . lubiprostone  24 mcg Oral Q breakfast  . midazolam  2 mg Intravenous Once  . mupirocin ointment  1 application Nasal BID  . oseltamivir  75 mg Oral BID  . pantoprazole (PROTONIX) IV  40 mg Intravenous Daily  . piperacillin-tazobactam (ZOSYN)  IV  3.375 g Intravenous  Q8H  . sodium chloride  500 mL Intravenous Once  . sodium chloride  10-40 mL Intracatheter Q12H  . vancomycin  750 mg Intravenous Q24H   Assessment: Bonnie [name] admitted from NH with flu-like symptoms.  CXR + PNA.  Broad-spectrum antibiotics to cover for HCAP.   Renal function at patient's baseline. Estimated Creatinine Clearance: 39.4 ml/min (by C-G formula based on Cr of 0.91).  Cefepime 12/30>> 1/1 Vancomycin 12/30>> Zosyn 1/1 >>  Goal of Therapy:  Vancomycin trough level 15-20 mcg/ml  Plan:  Zosyn 3.375gm IV q8h, each dose over 4 hrs Continue Vancomycin 750mg  IV q24h Check Vancomycin trough at steady state Monitor renal function and cx data   Hart Robinsons A 12/01/2013,7:42 AM

## 2013-12-01 NOTE — Progress Notes (Deleted)
Attempted A-line twice, got good blood return both times but unable to thread.

## 2013-12-01 NOTE — Progress Notes (Signed)
Bonnie [name] GMW:[id number] DOB: August 31, 1927 DOA: 11/07/2013 PCP: Glo Herring., MD  Brief narrative: 80 y.o. female none HTN, HLD, dementia, chronic constipation and diarrhea, adrenal insufficiency, acid reflux, breast cancer currently in remission and last radiation treatment approximately 9 months ago, anxiety and depression who presents with altered mental status  came to [place] ed from [facility] 11/13/2013 with flu-like symptoms.  She was most recently seen 12/27 in the emergency room where shewas noted to have a nonproductive cough temperature 99.1 and triaged home as chest x-ray was negative. She returned to the emergency room 12/30 and was found to have widespread pneumonia, positive H. Influenza She was started on broad-spectrum antibiotic as well as Tamiflu-BNP was 451, lactic acid 1.1 Unfortunately she had significant confusion and has a baseline of dementia  Patient subsequently decompensated 12/31 requiring intubation as well as placement of lines. Blood gas showed impending type II respiratory failure, 7.35/45.3 [compared to prior admission abg].  Was placed on PRVC Subsequent gas  12/01/13 = 7.42/35.2/97.2  Her pro calcitonin initially dropped to 12>>> then on am 12/01/13 was 35 Developed ATN secondary to moderate oliguria CT abdomen chest = bilateral lower lobe, right middle lobe, lingular consolidations most likely pneumonia-small bilateral pleural effusions cannot rule out malignancy   Past medical history-As per Problem list Chart reviewed as below-  Consultants:  None  Procedures:  Chest x-ray 12/30  Chest x-ray 12/31  CT abdomen pelvis 12/31   ET tube 12/31 , 22 cm   right subclavian line 12/31  Art line 12/31  NG tube 12/31  Antibiotics:  Cefepime 12/30>>12/01/13  Zosyn 12/01/13   Vancomycin 12/30  Tamiflu 12/30  Blood cultures pending  Strep pneumo negative  Legionella pending  HIV negative   Subjective  Patient on ventilator.  Arouses and awakens. Uncomfortable.  CVP 4-5 Patient's Art Line blood pressure drops to MAP 55 when she is asleep      Objective    Interim History: See above  Telemetry: Sinus tachycardia   Objective: Filed Vitals:   12/01/13 0303 12/01/13 0400 12/01/13 0500 12/01/13 0600  BP:  78/42 78/43 115/79  Pulse: 94 97 84 99  Temp:  100.9 F (38.3 C)    TempSrc:  Axillary    Resp: 14 17 16 24   Height:      Weight:   60.6 kg (133 lb 9.6 oz)   SpO2: 97% 97% 97% 84%    Intake/Output Summary (Last 24 hours) at 12/01/13 0820 Last data filed at 12/01/13 0600  Gross per 24 hour  Intake   1520 ml  Output    950 ml  Net    570 ml    Exam:  General: EOMI, ETT-AROUSES  Cardiovascular: S1-S2 tachycardic bounding pulses no JVD Respiratory:  rhonchorous with crepitations bilaterally lower posterior lung fields  Abdomen: Soft nontender nondistended Skin no lower extremity edema Neuro confused moving all 4 limbs however  Data Reviewed: Basic Metabolic Panel:  Recent Labs Lab 11/10/2013 1233 11/30/13 0236 12/01/13 0440  NA 132* 135* 139  K 3.9 3.5* 3.7  CL 93* 95* 101  CO2 27 24 24   GLUCOSE 170* 161* 123*  BUN 15 11 24*  CREATININE 0.79 0.70 0.91  CALCIUM 8.9 8.8 8.3*   Liver Function Tests:  Recent Labs Lab 11/16/2013 1233 11/30/13 0236 12/01/13 0440  AST 22 21 16   ALT 20 16 11   ALKPHOS 81 58 55  BILITOT 0.4 0.4 0.5  PROT 7.2 6.7 5.5*  ALBUMIN 3.8 3.2*  2.3*   No results found for this basename: LIPASE, AMYLASE,  in the last 168 hours No results found for this basename: AMMONIA,  in the last 168 hours CBC:  Recent Labs Lab 12-08-13 1233 11/30/13 0236 12/01/13 0440  WBC 8.6 7.3 6.0  NEUTROABS 7.5 5.8 5.3  HGB 13.1 12.8 10.0*  HCT 38.8 37.6 29.7*  MCV 86.2 85.6 86.6  PLT 223 200 200   Cardiac Enzymes:  Recent Labs Lab 12-08-2013 1433 Dec 08, 2013 2013 11/30/13 0236  TROPONINI <0.30 <0.30 <0.30   BNP: No components found with this basename: POCBNP,    CBG: No results found for this basename: GLUCAP,  in the last 168 hours  Recent Results (from the past 240 hour(s))  CULTURE, BLOOD (ROUTINE X 2)     Status: None   Collection Time    12-08-2013  2:34 PM      Result Value Range Status   Specimen Description BLOOD RIGHT ANTECUBITAL   Final   Special Requests     Final   Value: BOTTLES DRAWN AEROBIC AND ANAEROBIC AEB=12CC ANA=10CC   Culture NO GROWTH 1 DAY   Final   Report Status PENDING   Incomplete  CULTURE, BLOOD (ROUTINE X 2)     Status: None   Collection Time    Dec 08, 2013  2:40 PM      Result Value Range Status   Specimen Description BLOOD RIGHT HAND   Final   Special Requests BOTTLES DRAWN AEROBIC AND ANAEROBIC 5CC   Final   Culture NO GROWTH 1 DAY   Final   Report Status PENDING   Incomplete  MRSA PCR SCREENING     Status: Abnormal   Collection Time    12-08-2013  4:04 PM      Result Value Range Status   MRSA by PCR POSITIVE (*) NEGATIVE Final   Comment:            The GeneXpert MRSA Assay (FDA     approved for NASAL specimens     only), is one component of a     comprehensive MRSA colonization     surveillance program. It is not     intended to diagnose MRSA     infection nor to guide or     monitor treatment for     MRSA infections.     RESULT CALLED TO, READ BACK BY AND VERIFIED WITH:     COLLINS,B. AT 1906 ON 12/08/13 BY BAUGHAM,M.     Studies:              All Imaging reviewed and is as per above notation   Scheduled Meds: . ALPRAZolam  0.5 mg Oral TID WC   And  . ALPRAZolam  1 mg Oral QHS  . antiseptic oral rinse  15 mL Mouth Rinse QID  . chlorhexidine  15 mL Mouth Rinse BID  . Chlorhexidine Gluconate Cloth  6 each Topical Q0600  . docusate sodium  100 mg Oral BID  . DULoxetine  30 mg Oral Daily  . fentaNYL  100 mcg Intravenous Once  . heparin  5,000 Units Subcutaneous Q8H  . ipratropium-albuterol  3 mL Nebulization Q6H  . ketorolac  15 mg Intravenous Q6H  . lubiprostone  24 mcg Oral Q breakfast  .  midazolam  2 mg Intravenous Once  . mupirocin ointment  1 application Nasal BID  . oseltamivir  75 mg Oral BID  . pantoprazole (PROTONIX) IV  40 mg Intravenous Daily  . piperacillin-tazobactam (  ZOSYN)  IV  3.375 g Intravenous Q8H  . sodium chloride  500 mL Intravenous Once  . sodium chloride  10-40 mL Intracatheter Q12H  . vancomycin  750 mg Intravenous Q24H   Continuous Infusions: . sodium chloride 150 mL/hr at 12/01/13 0832     1.        Acute type I respiratory failure- sedated and intubated 12/31-appreciate pulmonary input. Will keep on PRvC at above settings-defer changes to her change of mode to pulmonology , may benefit from one to 2 more days of ventilator care-Rpt gas this am shows improvement in hypercarbia. sedation vacation every a.m. and weaning trial probably tomorrow morning .  Will repeat gas at 1300 2. Severe Septic shock 2/2 Multi-organ dysfunction 2/2 to H Flu+ secondary bacterial pneumonia-  Await sputum cultures Blood cultures. Cefepime transitioned to Zosyn 12/01/13 given worsening pro calcitonin 34. IV fluids were cut back 12/31 given mucus/blood coming from ET tube despite non-traumatic intubation, art line pressures are stable however. developing acute kidney injury. IV fluids restarted at 150 cc per hour x6 hours Repeat labs in a.m.-continue stress dose steroids 80 mg Solu-Medrol every 8 hourly--Have called critical care Medicine to assit with overall management.  Await CB 3. H. Influenza positive-continue Tamiflu at this stage.   4. Acute metabolic encephalopathy-likely secondary to hypoxia/contributor = stage III to 5 dementia. If persists, will obtain ammonia. Seems clearer today 5. DCIS (ductal carcinoma in situ) of breast-Stable at present outpatient followup. If no improvement consider CT scan chest and discussion with family  6. Hypoadrenalism-see above  7. Hypertension/with transient spells of hypotension when asleep  amlodipine low-dose 2.5 mg  discontinued 8. Hyperlipidemia-statin on hold   9. Dementia-at least stage III or 4. Will discuss further with son in the morning. She comes [facility] [place] [facility]  10. Metabolic encephalopathy-see above  11. Acute on chronic kidney disease, stage 3-norma-see #2 in addition, repeat basic metabolic panel [id number]  12. Elevated proBNP-unclear how to interpret lab value of 451 in a setting of sepsis. Would hold off on Lasix/repeat echocardiogram as this was done 08/26/13   Code Status: Full Family Communication: none at bedside.   Disposition Plan: sdu   >60 MIN cc TIME  Verneita Griffes, MD  [facility] Pager [phone] 12/01/2013, 8:20 AM    LOS: 2 days

## 2013-12-01 DEATH — deceased

## 2013-12-02 ENCOUNTER — Inpatient Hospital Stay (HOSPITAL_COMMUNITY): Payer: Medicare Other

## 2013-12-02 LAB — BLOOD GAS, ARTERIAL
ACID-BASE DEFICIT: 3 mmol/L — AB (ref 0.0–2.0)
Bicarbonate: 21.4 mEq/L (ref 20.0–24.0)
Drawn by: 22223
FIO2: 40 %
O2 Saturation: 99 %
PEEP: 5 cmH2O
Patient temperature: 37
RATE: 12 resp/min
TCO2: 20.7 mmol/L (ref 0–100)
VT: 450 mL
pCO2 arterial: 37.7 mmHg (ref 35.0–45.0)
pH, Arterial: 7.372 (ref 7.350–7.450)
pO2, Arterial: 145 mmHg — ABNORMAL HIGH (ref 80.0–100.0)

## 2013-12-02 LAB — RESPIRATORY VIRUS PANEL
Adenovirus: NOT DETECTED
Influenza A H1: NOT DETECTED
Influenza A H3: DETECTED — AB
Influenza A: DETECTED — AB
Influenza B: NOT DETECTED
Metapneumovirus: NOT DETECTED
Parainfluenza 1: NOT DETECTED
Parainfluenza 2: NOT DETECTED
Parainfluenza 3: NOT DETECTED
Respiratory Syncytial Virus A: NOT DETECTED
Respiratory Syncytial Virus B: NOT DETECTED
Rhinovirus: NOT DETECTED

## 2013-12-02 LAB — CBC WITH DIFFERENTIAL/PLATELET
BASOS ABS: 0 10*3/uL (ref 0.0–0.1)
BASOS PCT: 0 % (ref 0–1)
EOS PCT: 0 % (ref 0–5)
Eosinophils Absolute: 0 10*3/uL (ref 0.0–0.7)
HEMATOCRIT: 26.4 % — AB (ref 36.0–46.0)
HEMOGLOBIN: 8.7 g/dL — AB (ref 12.0–15.0)
Lymphocytes Relative: 6 % — ABNORMAL LOW (ref 12–46)
Lymphs Abs: 0.3 10*3/uL — ABNORMAL LOW (ref 0.7–4.0)
MCH: 28.7 pg (ref 26.0–34.0)
MCHC: 33 g/dL (ref 30.0–36.0)
MCV: 87.1 fL (ref 78.0–100.0)
MONO ABS: 0.5 10*3/uL (ref 0.1–1.0)
MONOS PCT: 9 % (ref 3–12)
NEUTROS ABS: 4.4 10*3/uL (ref 1.7–7.7)
Neutrophils Relative %: 84 % — ABNORMAL HIGH (ref 43–77)
Platelets: 216 10*3/uL (ref 150–400)
RBC: 3.03 MIL/uL — ABNORMAL LOW (ref 3.87–5.11)
RDW: 14.4 % (ref 11.5–15.5)
WBC: 5.2 10*3/uL (ref 4.0–10.5)

## 2013-12-02 LAB — COMPREHENSIVE METABOLIC PANEL
ALT: 9 U/L (ref 0–35)
AST: 10 U/L (ref 0–37)
Albumin: 1.9 g/dL — ABNORMAL LOW (ref 3.5–5.2)
Alkaline Phosphatase: 49 U/L (ref 39–117)
BUN: 22 mg/dL (ref 6–23)
CO2: 23 meq/L (ref 19–32)
Calcium: 8.3 mg/dL — ABNORMAL LOW (ref 8.4–10.5)
Chloride: 107 mEq/L (ref 96–112)
Creatinine, Ser: 0.77 mg/dL (ref 0.50–1.10)
GFR calc Af Amer: 87 mL/min — ABNORMAL LOW (ref >90)
GFR, EST NON AFRICAN AMERICAN: 75 mL/min — AB (ref >90)
GLUCOSE: 109 mg/dL — AB (ref 70–99)
Potassium: 3.3 mEq/L — ABNORMAL LOW (ref 3.7–5.3)
SODIUM: 140 meq/L (ref 137–147)
Total Bilirubin: 0.6 mg/dL (ref 0.3–1.2)
Total Protein: 5 g/dL — ABNORMAL LOW (ref 6.0–8.3)

## 2013-12-02 LAB — LEGIONELLA ANTIGEN, URINE: Legionella Antigen, Urine: NEGATIVE

## 2013-12-02 LAB — PROCALCITONIN: PROCALCITONIN: 18 ng/mL

## 2013-12-02 LAB — LACTIC ACID, PLASMA: Lactic Acid, Venous: 0.8 mmol/L (ref 0.5–2.2)

## 2013-12-02 LAB — PROTIME-INR
INR: 1.05 (ref 0.00–1.49)
Prothrombin Time: 13.5 seconds (ref 11.6–15.2)

## 2013-12-02 MED ORDER — FUROSEMIDE 10 MG/ML IJ SOLN
40.0000 mg | Freq: Once | INTRAMUSCULAR | Status: AC
  Administered 2013-12-02: 40 mg via INTRAVENOUS
  Filled 2013-12-02: qty 4

## 2013-12-02 MED ORDER — OSELTAMIVIR PHOSPHATE 6 MG/ML PO SUSR
75.0000 mg | Freq: Two times a day (BID) | ORAL | Status: AC
  Administered 2013-12-02 – 2013-12-04 (×5): 75 mg
  Filled 2013-12-02 (×5): qty 12.5

## 2013-12-02 MED ORDER — FENTANYL CITRATE 0.05 MG/ML IJ SOLN
INTRAMUSCULAR | Status: AC
  Filled 2013-12-02: qty 50

## 2013-12-02 NOTE — Progress Notes (Signed)
Bonnie [name] YBO:[id number] DOB: 1927/06/09 DOA: 12-14-13 PCP: Glo Herring., MD  Brief narrative: 80 y.o. female none HTN, HLD, dementia, chronic constipation and diarrhea, adrenal insufficiency, acid reflux, breast cancer currently in remission and last radiation treatment approximately 9 months ago, anxiety and depression who presents with altered mental status  came to [facility] ed from [facility] 12-14-2013 with flu-like symptoms.  She was most recently seen 12/27 in the emergency room where shewas noted to have a nonproductive cough temperature 99.1 and triaged home as chest x-ray was negative. She returned to the emergency room 12/30 and was found to have widespread pneumonia, positive H. Influenza She was started on broad-spectrum antibiotic as well as Tamiflu-BNP was 451, lactic acid 1.1 Unfortunately she had significant confusion and has a baseline of dementia  Patient subsequently decompensated 12/31 requiring intubation as well as placement of lines. Blood gas showed impending type II respiratory failure, 7.35/45.3 [compared to prior admission abg].  Was placed on PRVC Subsequent gas  12/01/13 = 7.42/35.2/97.2  Her pro calcitonin initially dropped to 12>>> then on am 12/01/13 was 35 Developed ATN secondary to moderate oliguria CT abdomen chest = bilateral lower lobe, right middle lobe, lingular consolidations most likely pneumonia-small bilateral pleural effusions cannot rule out malignancy   Past medical history-As per Problem list Chart reviewed as below-  Consultants:  None  Procedures:  Chest x-ray 12/30  Chest x-ray 12/31  CT abdomen pelvis 12/31   ET tube 12/31 , 22 cm   right subclavian line 12/31  Art line 12/31  NG tube 12/31  Antibiotics:  Cefepime 12/30>>12/01/13  Zosyn 12/01/13   Vancomycin 12/30  Tamiflu 12/30  Sputum cult-Staphy ++  Resp virus panel P  Blood cultures pending  Strep pneumo negative  Legionella pending  HIV  negative   Subjective  Patient on ventilator. Arouses and awakens. Uncomfortable.  CVP now 15 after aggressive fluid resusc Patient's Art Line blood pressure drops to MAP 55 when she is asleep      Objective    Interim History: See above  Telemetry: Sinus tachycardia   Objective: Filed Vitals:   12/02/13 0730 12/02/13 0800 12/02/13 0830 12/02/13 0957  BP: 120/56 130/88 124/60   Pulse: 91 120 103   Temp:      TempSrc:      Resp: 12 23 14    Height:    5\' 2"  (1.575 m)  Weight:      SpO2: 98% 95% 97%     Intake/Output Summary (Last 24 hours) at 12/02/13 1010 Last data filed at 12/02/13 2778  Gross per 24 hour  Intake 3143.42 ml  Output    800 ml  Net 2343.42 ml    Exam:  General: EOMI, ETT-AROUSES  Cardiovascular: S1-S2 tachycardic bounding pulses no JVD Respiratory:  rhonchorous with crepitations bilaterally lower posterior lung fields  Abdomen: Soft nontender nondistended Skin no lower extremity edema Neuro confused moving all 4 limbs however  Data Reviewed: Basic Metabolic Panel:  Recent Labs Lab 12/14/13 1233 11/30/13 0236 12/01/13 0440 12/01/13 1259 12/02/13 0421  NA 132* 135* 139 137 140  K 3.9 3.5* 3.7 3.7 3.3*  CL 93* 95* 101 103 107  CO2 27 24 24 25 23   GLUCOSE 170* 161* 123* 122* 109*  BUN 15 11 24* 22 22  CREATININE 0.79 0.70 0.91 0.76 0.77  CALCIUM 8.9 8.8 8.3* 8.3* 8.3*   Liver Function Tests:  Recent Labs Lab 12/14/2013 1233 11/30/13 0236 12/01/13 0440 12/01/13 1259 12/02/13 0421  AST 22 21 16 14 10   ALT 20 16 11 11 9   ALKPHOS 81 58 55 52 49  BILITOT 0.4 0.4 0.5 0.5 0.6  PROT 7.2 6.7 5.5* 5.4* 5.0*  ALBUMIN 3.8 3.2* 2.3* 2.3* 1.9*   No results found for this basename: LIPASE, AMYLASE,  in the last 168 hours No results found for this basename: AMMONIA,  in the last 168 hours CBC:  Recent Labs Lab 11/02/2013 1233 11/30/13 0236 12/01/13 0440 12/02/13 0421  WBC 8.6 7.3 6.0 5.2  NEUTROABS 7.5 5.8 5.3 4.4  HGB 13.1 12.8  10.0* 8.7*  HCT 38.8 37.6 29.7* 26.4*  MCV 86.2 85.6 86.6 87.1  PLT 223 200 200 216   Cardiac Enzymes:  Recent Labs Lab 11/04/2013 1433 11/28/2013 2013 11/30/13 0236  TROPONINI <0.30 <0.30 <0.30   BNP: No components found with this basename: POCBNP,  CBG: No results found for this basename: GLUCAP,  in the last 168 hours  Recent Results (from the past 240 hour(s))  CULTURE, BLOOD (ROUTINE X 2)     Status: None   Collection Time    11/27/2013  2:34 PM      Result Value Range Status   Specimen Description BLOOD RIGHT ANTECUBITAL   Final   Special Requests     Final   Value: BOTTLES DRAWN AEROBIC AND ANAEROBIC AEB=12CC ANA=10CC   Culture NO GROWTH 3 DAYS   Final   Report Status PENDING   Incomplete  CULTURE, BLOOD (ROUTINE X 2)     Status: None   Collection Time    11/26/2013  2:40 PM      Result Value Range Status   Specimen Description BLOOD RIGHT HAND   Final   Special Requests BOTTLES DRAWN AEROBIC AND ANAEROBIC 5CC   Final   Culture NO GROWTH 3 DAYS   Final   Report Status PENDING   Incomplete  MRSA PCR SCREENING     Status: Abnormal   Collection Time    11/07/2013  4:04 PM      Result Value Range Status   MRSA by PCR POSITIVE (*) NEGATIVE Final   Comment:            The GeneXpert MRSA Assay (FDA     approved for NASAL specimens     only), is one component of a     comprehensive MRSA colonization     surveillance program. It is not     intended to diagnose MRSA     infection nor to guide or     monitor treatment for     MRSA infections.     RESULT CALLED TO, READ BACK BY AND VERIFIED WITH:     COLLINS,B. AT 1906 ON 10/31/2013 BY BAUGHAM,M.  CULTURE, RESPIRATORY (NON-EXPECTORATED)     Status: None   Collection Time    11/30/13  3:59 PM      Result Value Range Status   Specimen Description TRACHEAL ASPIRATE   Final   Special Requests BLOODY SPECIMEN   Final   Gram Stain     Final   Value: ABUNDANT WBC PRESENT, PREDOMINANTLY PMN     RARE SQUAMOUS EPITHELIAL CELLS  PRESENT     ABUNDANT GRAM POSITIVE COCCI IN PAIRS     IN CLUSTERS IN CHAINS     Performed at [facility]   Culture PENDING   Incomplete   Report Status PENDING   Incomplete     Studies:  All Imaging reviewed and is as per above notation   Scheduled Meds: . ALPRAZolam  0.5 mg Oral TID WC   And  . ALPRAZolam  1 mg Oral QHS  . antiseptic oral rinse  15 mL Mouth Rinse QID  . chlorhexidine  15 mL Mouth Rinse BID  . Chlorhexidine Gluconate Cloth  6 each Topical Q0600  . docusate sodium  100 mg Oral BID  . fentaNYL  100 mcg Intravenous Once  . fentaNYL  50 mcg Intravenous Once  . furosemide  40 mg Intravenous Once  . heparin  5,000 Units Subcutaneous Q8H  . ipratropium-albuterol  3 mL Nebulization Q6H  . ketorolac  15 mg Intravenous Q6H  . midazolam  2 mg Intravenous Once  . mupirocin ointment  1 application Nasal BID  . oseltamivir  75 mg Oral BID  . pantoprazole (PROTONIX) IV  40 mg Intravenous Daily  . piperacillin-tazobactam (ZOSYN)  IV  3.375 g Intravenous Q8H  . sodium chloride  500 mL Intravenous Once  . sodium chloride  10-40 mL Intracatheter Q12H  . vancomycin  750 mg Intravenous Q24H   Continuous Infusions: . fentaNYL infusion INTRAVENOUS Stopped (12/02/13 0838)     1.        Acute type I respiratory failure- sedated and intubated 12/31-appreciate pulmonary input. Will keep on PRvC at above settings-defer changes to her change of mode to pulmonology , may benefit from one to 2 more days of ventilator care-Rpt gas this am shows improvement in hypercarbia. Sedation vacation-weaning trial was performed however patient became agitated. 2. Severe Septic shock 2/2 Multi-organ dysfunction 2/2 to H Flu+ secondary bacterial pneumonia-  Await sputum cultures Blood cultures. Cefepime transitioned to Zosyn 12/01/13 given worsening pro calcitonin 34>>>18. art line pressures are stable however.  Keep Euvolemiac 3. Iatrogenic Vol oveload-CP 15 this am.  IVF d/c.   Given IV lasix x 1.  -2.29 liters  4. H. Influenza positive-continue Tamiflu at this stage.   5. Acute metabolic encephalopathy-likely secondary to hypoxia/contributor = stage III to 5 dementia. If persists, will obtain ammonia. Seems clearer today 6. DCIS (ductal carcinoma in situ) of breast-Stable at present outpatient followup. If no improvement consider CT scan chest and discussion with family  7. Hypoadrenalism-see above  8. Hypertension/with transient spells of hypotension when asleep  amlodipine low-dose 2.5 mg discontinued 9. Hyperlipidemia-statin on hold   10. Dementia-at least stage III or 4. Will discuss further with son in the morning. [facility] [place] [facility]  11. Metabolic encephalopathy-see above  12. Acute on chronic kidney disease, stage 3-norma-see #2 13. Elevated proBNP-unclear how to interpret lab value of 451 in a setting of sepsis. Would hold off on Lasix/repeat echocardiogram as this was done 08/26/13   Today summary Keep I./O. Stable-given Lasix x1 Try to wean again in a.m. Delineate antibiotics further tomorrow with pro calcitonin as well as x-ray Labs a.m. CMet CBC   Code Status: Full Family Communication: none at bedside.   Disposition Plan: sdu   >60 MIN cc TIME  Verneita Griffes, MD  [facility] Pager [phone] 12/02/2013, 10:10 AM    LOS: 3 days

## 2013-12-02 NOTE — Clinical Social Work Note (Signed)
Update given to [facility], facility continues to be willing to accept patient at discharge.  Informed that patient is on droplet isolation at present.  Edwyna Shell, LCSW Clinical Social Worker [phone])

## 2013-12-02 NOTE — Progress Notes (Signed)
NAMENAKYA, WEYAND NO.:  [id number]  MEDICAL RECORD NO.:  [id number]  LOCATION:  IC04                          FACILITY:  [facility]  PHYSICIAN:  Acel Natzke L. Luan Pulling, M.D.DATE OF BIRTH:  11/10/1927  DATE OF PROCEDURE: DATE OF DISCHARGE:                                PROGRESS NOTE   Ms. Stamp remains intubated and on the ventilator.  She has been somewhat agitated.  No other new problems have been noted.  PHYSICAL EXAMINATION:  VITAL SIGNS:  Her temperature has been as high as 102, most recent is 100.3, pulse is 89,  respirations are 12, blood pressure 104/50, noninvasively but in the odd line it is 150/79.  O2 sats 97%.  Blood gas this morning shows pH of 7.423, pCO2 of 35, pO2 of 97. CHEST:  Relatively clear.  She is somewhat agitated. HEART:  Regular. ABDOMEN:  Soft.  Chest x-ray shows essentially no change.  Her kidney function is  little bit worse with a BUN of 24, creatinine 0.91.  Her hemoglobin has come down to 10 with a hematocrit of 29.7.  ASSESSMENT:  She has acute respiratory failure.  She has acute on chronic kidney disease.  She has dementia which makes things a little bit more difficult to be sure about.  She has healthcare associated pneumonia.  She has severe sepsis with septic shock and her blood pressure is better.  She seems to be developing more problems with her renal function.  My plan then, I do not think she is ready for coming off of the ventilator.  Because she is having so much trouble with her agitation, I am going to add some Versed.     Sai Zinn L. Luan Pulling, M.D.     ELH/MEDQ  D:  12/01/2013  T:  12/01/2013  Job:  [id number]

## 2013-12-02 NOTE — Progress Notes (Signed)
Subjective: She remains intubated on the ventilator. Her agitation is better. No other new complaints have been noted.  Objective: Vital signs in last 24 hours: Temp:  [98.5 F (36.9 C)-100.9 F (38.3 C)] 98.5 F (36.9 C) (01/02 0400) Pulse Rate:  [60-151] 65 (01/02 0630) Resp:  [12-33] 14 (01/02 0630) BP: (74-179)/(39-103) 127/75 mmHg (01/02 0630) SpO2:  [87 %-100 %] 100 % (01/02 0715) Arterial Line BP: (70-214)/(41-96) 138/60 mmHg (01/02 0630) FiO2 (%):  [40 %] 40 % (01/02 0715) Weight:  [63.8 kg (140 lb 10.5 oz)] 63.8 kg (140 lb 10.5 oz) (01/02 0500) Weight change: 3.2 kg (7 lb 0.9 oz) Last BM Date:  (unknown)  Intake/Output from previous day: 01/01 0701 - 01/02 0700 In: 3390.1 [I.V.:3140.1; IV Piggyback:250] Out: 1150 [Urine:1150]  PHYSICAL EXAM General appearance: She is intubated and sedated. Resp: rhonchi bilaterally Cardio: regular rate and rhythm, S1, S2 normal, no murmur, click, rub or gallop GI: soft, non-tender; bowel sounds normal; no masses,  no organomegaly Extremities: extremities normal, atraumatic, no cyanosis or edema  Lab Results:    Basic Metabolic Panel:  Recent Labs  12/01/13 1259 12/02/13 0421  NA 137 140  K 3.7 3.3*  CL 103 107  CO2 25 23  GLUCOSE 122* 109*  BUN 22 22  CREATININE 0.76 0.77  CALCIUM 8.3* 8.3*   Liver Function Tests:  Recent Labs  12/01/13 1259 12/02/13 0421  AST 14 10  ALT 11 9  ALKPHOS 52 49  BILITOT 0.5 0.6  PROT 5.4* 5.0*  ALBUMIN 2.3* 1.9*   No results found for this basename: LIPASE, AMYLASE,  in the last 72 hours No results found for this basename: AMMONIA,  in the last 72 hours CBC:  Recent Labs  12/01/13 0440 12/02/13 0421  WBC 6.0 5.2  NEUTROABS 5.3 4.4  HGB 10.0* 8.7*  HCT 29.7* 26.4*  MCV 86.6 87.1  PLT 200 216   Cardiac Enzymes:  Recent Labs  11/09/2013 1433 11/14/2013 2013 11/30/13 0236  TROPONINI <0.30 <0.30 <0.30   BNP:  Recent Labs  11/30/2013 1233  PROBNP 451.7*    D-Dimer: No results found for this basename: DDIMER,  in the last 72 hours CBG: No results found for this basename: GLUCAP,  in the last 72 hours Hemoglobin A1C: No results found for this basename: HGBA1C,  in the last 72 hours Fasting Lipid Panel: No results found for this basename: CHOL, HDL, LDLCALC, TRIG, CHOLHDL, LDLDIRECT,  in the last 72 hours Thyroid Function Tests: No results found for this basename: TSH, T4TOTAL, FREET4, T3FREE, THYROIDAB,  in the last 72 hours Anemia Panel: No results found for this basename: VITAMINB12, FOLATE, FERRITIN, TIBC, IRON, RETICCTPCT,  in the last 72 hours Coagulation:  Recent Labs  12/02/13 0421  LABPROT 13.5  INR 1.05   Urine Drug Screen: Drugs of Abuse  No results found for this basename: labopia, cocainscrnur, labbenz, amphetmu, thcu, labbarb    Alcohol Level: No results found for this basename: ETH,  in the last 72 hours Urinalysis:  Recent Labs  11/18/2013 1454  COLORURINE YELLOW  LABSPEC 1.020  PHURINE 5.5  [place]  UROBILINOGEN 0.2  NITRITE NEGATIVE  [place]. Labs:  ABGS  Recent Labs  12/02/13 0526  PHART 7.372  PO2ART 145.0*  TCO2 20.7  HCO3 21.4   CULTURES Recent Results (from the past 240 hour(s))  CULTURE, BLOOD (ROUTINE X 2)  Status: None   Collection Time    11/07/2013  2:34 PM      Result Value Range Status   Specimen Description BLOOD RIGHT ANTECUBITAL   Final   Special Requests     Final   Value: BOTTLES DRAWN AEROBIC AND ANAEROBIC AEB=12CC ANA=10CC   Culture NO GROWTH 3 DAYS   Final   Report Status PENDING   Incomplete  CULTURE, BLOOD (ROUTINE X 2)     Status: None   Collection Time    11/01/2013  2:40 PM      Result Value Range Status   Specimen Description BLOOD RIGHT HAND   Final   Special Requests BOTTLES DRAWN AEROBIC AND ANAEROBIC 5CC   Final   Culture NO GROWTH 3 DAYS    Final   Report Status PENDING   Incomplete  MRSA PCR SCREENING     Status: Abnormal   Collection Time    11/02/2013  4:04 PM      Result Value Range Status   MRSA by PCR POSITIVE (*) NEGATIVE Final   Comment:            The GeneXpert MRSA Assay (FDA     approved for NASAL specimens     only), is one component of a     comprehensive MRSA colonization     surveillance program. It is not     intended to diagnose MRSA     infection nor to guide or     monitor treatment for     MRSA infections.     RESULT CALLED TO, READ BACK BY AND VERIFIED WITH:     COLLINS,B. AT 1906 ON 11/05/2013 BY BAUGHAM,M.  CULTURE, RESPIRATORY (NON-EXPECTORATED)     Status: None   Collection Time    11/30/13  3:59 PM      Result Value Range Status   Specimen Description TRACHEAL ASPIRATE   Final   Special Requests BLOODY SPECIMEN   Final   Gram Stain     Final   Value: ABUNDANT WBC PRESENT, PREDOMINANTLY PMN     RARE SQUAMOUS EPITHELIAL CELLS PRESENT     ABUNDANT GRAM POSITIVE COCCI IN PAIRS     IN CLUSTERS IN CHAINS     Performed at [facility]   Culture PENDING   Incomplete   Report Status PENDING   Incomplete   Studies/Results: Ct Chest W Contrast  11/30/2013   CLINICAL DATA:  Intubated. Abdominal pain, flu. Respiratory failure.  EXAM: CT CHEST WITH CONTRAST  TECHNIQUE: Multidetector CT imaging of the chest was performed during intravenous contrast administration.  CONTRAST:  118mL OMNIPAQUE IOHEXOL 300 MG/ML  SOLN  COMPARISON:  Chest x-ray earlier today.  FINDINGS: Small bilateral pleural effusions, right greater than left. Bibasilar atelectasis noted. Areas of consolidation noted in the right middle lobe and lingula. Nodules are noted in both lower lobes. 7 mm nodule in the right lower lobe on image 32. 10 mm nodule in the left lower lobe on image 24. 15 mm nodule in the right lower lobe on image 36 medially. It is unclear if these new nodules could be related to an infectious process or  metastasis. Recommend followup after treatment and resolution of acute symptoms.  Endotracheal tube is in the lower trachea. Small scattered mediastinal lymph nodes, none pathologically enlarged. No axillary or hilar adenopathy. Mild cardiomegaly. Scattered coronary artery calcifications.  No acute bony abnormality.  IMPRESSION: Bilateral lower lobe, right middle lobe and lingular areas of consolidation, most likely  pneumonia.  Nodular densities/opacities in both lower lobes. This may be related to an infectious process/pneumonia, but recommend followup after resolution of acute symptoms and treatment as metastatic disease cannot be excluded.  Small bilateral pleural effusions, right greater than left.  Cardiomegaly.   Electronically Signed   By: Rolm Baptise M.D.   On: 11/30/2013 16:16   Ct Abdomen Pelvis W Contrast  11/30/2013   CLINICAL DATA:  Abdominal pain.  Intubated.  Flu.  EXAM: CT ABDOMEN AND PELVIS WITH CONTRAST  TECHNIQUE: Multidetector CT imaging of the abdomen and pelvis was performed using the standard protocol following bolus administration of intravenous contrast.  CONTRAST:  165mL OMNIPAQUE IOHEXOL 300 MG/ML  SOLN  COMPARISON:  07/05/2010  FINDINGS: Small bilateral pleural effusions, right slightly greater than left. Airspace disease noted in the lower lobes bilaterally, lingula and right middle lobe. Mild cardiomegaly.  Mild diffuse fatty infiltration of the liver. No focal abnormality. Spleen, pancreas, adrenals are unremarkable. Small cysts in the kidneys bilaterally. No hydronephrosis. Areas of cortical thinning and scarring bilaterally. Small nonobstructing stone in the lower pole of the left kidney.  NG tube is in place with the tip in the duodenal bulb. Stomach is decompressed, grossly unremarkable. No evidence of bowel obstruction. Sigmoid diverticulosis without active diverticulitis. Foley catheter is in place in the bladder. Calcification noted posterior to the left side of the  bladder. This is likely within the left ureter at the left UVJ, measuring 6 mm. No associated hydronephrosis.  Aorta and iliac vessels are heavily calcified, non aneurysmal. No free fluid, free air or adenopathy.  No acute bony abnormality. Degenerative changes in the thoracolumbar spine.  IMPRESSION: Mild fatty infiltration of the liver.  NG tube tip in the duodenum.  Sigmoid diverticulosis.  Apparent 6 mm distal left ureteral stone at the UVJ without obstruction/hydronephrosis. Small nonobstructing stone in the lower pole of the left kidney.  Bilateral pleural effusions. Lingular, right middle lobe and bilateral lower lobe airspace opacities.   Electronically Signed   By: Rolm Baptise M.D.   On: 11/30/2013 15:59   Dg Chest Port 1 View  12/02/2013   CLINICAL DATA:  Pneumonia.  EXAM: PORTABLE CHEST - 1 VIEW  COMPARISON:  12/01/2013  FINDINGS: Nasogastric tube is present coursing into the region of the stomach and off the inferior portion of the film. Left subclavian central venous catheter is unchanged. Endotracheal tube has tip approximately 3.5 cm above the carinal. Lungs are hypoinflated with slight worsening bilateral perihilar bibasilar opacification suggesting a moderate interstitial edema, although cannot exclude infection in the lung bases. Possible small amount of bilateral pleural fluid unchanged. Cardiomediastinal silhouette and remainder of the exam is unchanged.  IMPRESSION: Worsening bilateral perihilar bibasilar opacification suggesting interstitial edema, although cannot exclude infection. Possible small amount of bilateral pleural fluid unchanged.  Tubes and lines unchanged.   Electronically Signed   By: Marin Olp M.D.   On: 12/02/2013 07:59   Portable Chest Xray In Am  12/01/2013   CLINICAL DATA:  Respiratory failure.  EXAM: PORTABLE CHEST - 1 VIEW  COMPARISON:  11/30/2013  FINDINGS: Enteric tube courses into the region of the stomach and off the inferior portion of the film as tip is not  visualized. Endotracheal tube is unchanged with tip approximately 4.5 cm above the carinal. Left subclavian central venous catheter is unchanged with tip overlying the SVC.  Lungs are somewhat hypoinflated with bilateral perihilar and bibasilar opacification without significant change likely due to interstitial edema with possible small  amount of bilateral pleural fluid with associated atelectasis. Cannot completely exclude infection in the lung bases. Remainder of the exam is unchanged.  IMPRESSION: Continued bilateral perihilar bibasilar opacification likely due to interstitial edema and less likely infection. Suggestion of small bilateral pleural effusions with associated atelectasis.  Tubes and lines as described.   Electronically Signed   By: Marin Olp M.D.   On: 12/01/2013 07:11   Dg Chest Port 1v Same Day  11/30/2013   CLINICAL DATA:  Intubation.  EXAM: PORTABLE CHEST - 1 VIEW SAME DAY  COMPARISON:  11/30/2013  FINDINGS: Endotracheal tube is approximately 3 cm above the carina. Left central line tip is in the SVC. NG tube enters the stomach. No pneumothorax.  There is hyperinflation of the lungs compatible with COPD. Cardiomegaly. Bilateral interstitial opacities. Increasing bibasilar atelectasis or infiltrates.  IMPRESSION: Interval intubation with endotracheal tube 3 cm above the carina. Left central line tip in the SVC. No pneumothorax.  Continued diffuse interstitial opacities. Increasing focal airspace opacities in the lower lobes.   Electronically Signed   By: Rolm Baptise M.D.   On: 11/30/2013 13:27    Medications:  Prior to Admission:  Prescriptions prior to admission  Medication Sig Dispense Refill  . ALPRAZolam (XANAX) 0.5 MG tablet Take 0.5-1 mg by mouth 2 (two) times daily. One tablet in the morning and 2 at bedtime      . amLODipine (NORVASC) 2.5 MG tablet Take 2.5 mg by mouth daily.      Marland Kitchen aspirin EC 81 MG tablet Take 81 mg by mouth daily.      . Aspirin-Acetaminophen-Caffeine  (GOODY HEADACHE PO) Take 1 packet by mouth daily as needed (headaches).      . benzonatate (TESSALON) 100 MG capsule Take 100 mg by mouth 3 (three) times daily as needed for cough.      . docusate sodium (COLACE) 100 MG capsule Take 100 mg by mouth 2 (two) times daily.       . DULoxetine (CYMBALTA) 30 MG capsule Take 30 mg by mouth daily.      Marland Kitchen HYDROcodone-acetaminophen (NORCO) 10-325 MG per tablet Take 1-2 tablets by mouth every 6 (six) hours as needed for moderate pain.      . hydrocortisone (CORTEF) 20 MG tablet Take 10 mg by mouth daily.       . Loteprednol Etabonate (LOTEMAX) 0.5 % GEL Apply 1 drop to eye 3 (three) times daily.      Marland Kitchen lubiprostone (AMITIZA) 24 MCG capsule Take 24 mcg by mouth daily with breakfast.       . Multiple Vitamin (DAILY VITE PO) Take 1 tablet by mouth daily.      . pravastatin (PRAVACHOL) 80 MG tablet Take 1 tablet (80 mg total) by mouth daily.  30 tablet  0  . psyllium (METAMUCIL) 58.6 % packet Take 1 packet by mouth daily.       Scheduled: . ALPRAZolam  0.5 mg Oral TID WC   And  . ALPRAZolam  1 mg Oral QHS  . antiseptic oral rinse  15 mL Mouth Rinse QID  . chlorhexidine  15 mL Mouth Rinse BID  . Chlorhexidine Gluconate Cloth  6 each Topical Q0600  . docusate sodium  100 mg Oral BID  . fentaNYL  100 mcg Intravenous Once  . fentaNYL  50 mcg Intravenous Once  . heparin  5,000 Units Subcutaneous Q8H  . ipratropium-albuterol  3 mL Nebulization Q6H  . ketorolac  15 mg Intravenous Q6H  .  midazolam  2 mg Intravenous Once  . mupirocin ointment  1 application Nasal BID  . oseltamivir  75 mg Oral BID  . pantoprazole (PROTONIX) IV  40 mg Intravenous Daily  . piperacillin-tazobactam (ZOSYN)  IV  3.375 g Intravenous Q8H  . sodium chloride  500 mL Intravenous Once  . sodium chloride  10-40 mL Intracatheter Q12H  . vancomycin  750 mg Intravenous Q24H   Continuous: . sodium chloride 150 mL/hr at 12/02/13 0600  . fentaNYL infusion INTRAVENOUS 100 mcg/hr (12/02/13  0600)   S[facility]:[id number], fentaNYL, midazolam, morphine injection, sodium chloride  Assesment: She has acute respiratory failure on the basis of severe sepsis with septic shock. She is improving. She still has some fever. Her blood pressure is better. Principal Problem:   Severe sepsis with septic shock Active Problems:   DCIS (ductal carcinoma in situ) of breast   Hypoadrenalism   Hypertension   Hyperlipidemia   Dementia   Metabolic encephalopathy   Acute on chronic kidney disease, stage 3   HCAP (healthcare-associated pneumonia)    Plan: Attempt weaning today    LOS: 3 days   Jericha Bryden L 12/02/2013, 8:12 AM

## 2013-12-03 ENCOUNTER — Inpatient Hospital Stay (HOSPITAL_COMMUNITY): Payer: Medicare Other

## 2013-12-03 LAB — GLUCOSE, CAPILLARY
Glucose-Capillary: 102 mg/dL — ABNORMAL HIGH (ref 70–99)
Glucose-Capillary: 192 mg/dL — ABNORMAL HIGH (ref 70–99)
Glucose-Capillary: 215 mg/dL — ABNORMAL HIGH (ref 70–99)

## 2013-12-03 LAB — BLOOD GAS, ARTERIAL
Acid-base deficit: 8.6 mmol/L — ABNORMAL HIGH (ref 0.0–2.0)
Bicarbonate: 15.1 mEq/L — ABNORMAL LOW (ref 20.0–24.0)
Drawn by: 22223
FIO2: 35 %
MECHVT: 450 mL
O2 SAT: 98 %
PATIENT TEMPERATURE: 37
PEEP/CPAP: 5 cmH2O
PH ART: 7.408 (ref 7.350–7.450)
PO2 ART: 110 mmHg — AB (ref 80.0–100.0)
RATE: 12 resp/min
TCO2: 14.3 mmol/L (ref 0–100)
pCO2 arterial: 24.5 mmHg — ABNORMAL LOW (ref 35.0–45.0)

## 2013-12-03 LAB — CBC WITH DIFFERENTIAL/PLATELET
Basophils Absolute: 0 10*3/uL (ref 0.0–0.1)
Basophils Relative: 0 % (ref 0–1)
EOS ABS: 0.1 10*3/uL (ref 0.0–0.7)
Eosinophils Relative: 1 % (ref 0–5)
HEMATOCRIT: 29.1 % — AB (ref 36.0–46.0)
HEMOGLOBIN: 9.7 g/dL — AB (ref 12.0–15.0)
LYMPHS ABS: 0.5 10*3/uL — AB (ref 0.7–4.0)
Lymphocytes Relative: 5 % — ABNORMAL LOW (ref 12–46)
MCH: 28.6 pg (ref 26.0–34.0)
MCHC: 33.3 g/dL (ref 30.0–36.0)
MCV: 85.8 fL (ref 78.0–100.0)
MONO ABS: 1.1 10*3/uL — AB (ref 0.1–1.0)
MONOS PCT: 11 % (ref 3–12)
Neutro Abs: 7.9 10*3/uL — ABNORMAL HIGH (ref 1.7–7.7)
Neutrophils Relative %: 83 % — ABNORMAL HIGH (ref 43–77)
Platelets: 315 10*3/uL (ref 150–400)
RBC: 3.39 MIL/uL — AB (ref 3.87–5.11)
RDW: 14 % (ref 11.5–15.5)
WBC: 9.5 10*3/uL (ref 4.0–10.5)

## 2013-12-03 LAB — MAGNESIUM: Magnesium: 1.7 mg/dL (ref 1.5–2.5)

## 2013-12-03 LAB — CBC
HCT: 26.6 % — ABNORMAL LOW (ref 36.0–46.0)
Hemoglobin: 8.9 g/dL — ABNORMAL LOW (ref 12.0–15.0)
MCH: 28.7 pg (ref 26.0–34.0)
MCHC: 33.5 g/dL (ref 30.0–36.0)
MCV: 85.8 fL (ref 78.0–100.0)
PLATELETS: 279 10*3/uL (ref 150–400)
RBC: 3.1 MIL/uL — ABNORMAL LOW (ref 3.87–5.11)
RDW: 14.1 % (ref 11.5–15.5)
WBC: 7.2 10*3/uL (ref 4.0–10.5)

## 2013-12-03 LAB — BASIC METABOLIC PANEL
BUN: 20 mg/dL (ref 6–23)
BUN: 21 mg/dL (ref 6–23)
CALCIUM: 8.6 mg/dL (ref 8.4–10.5)
CO2: 26 mEq/L (ref 19–32)
CO2: 23 mEq/L (ref 19–32)
Calcium: 8.6 mg/dL (ref 8.4–10.5)
Chloride: 105 mEq/L (ref 96–112)
Chloride: 102 mEq/L (ref 96–112)
Creatinine, Ser: 0.77 mg/dL (ref 0.50–1.10)
Creatinine, Ser: 0.72 mg/dL (ref 0.50–1.10)
GFR calc Af Amer: 87 mL/min — ABNORMAL LOW (ref >90)
GFR calc Af Amer: 89 mL/min — ABNORMAL LOW (ref >90)
GFR calc non Af Amer: 75 mL/min — ABNORMAL LOW (ref >90)
GFR, EST NON AFRICAN AMERICAN: 77 mL/min — AB (ref >90)
Glucose, Bld: 104 mg/dL — ABNORMAL HIGH (ref 70–99)
Glucose, Bld: 205 mg/dL — ABNORMAL HIGH (ref 70–99)
Potassium: 2.6 mEq/L — CL (ref 3.7–5.3)
Potassium: 3.1 mEq/L — ABNORMAL LOW (ref 3.7–5.3)
SODIUM: 141 meq/L (ref 137–147)
Sodium: 141 mEq/L (ref 137–147)

## 2013-12-03 LAB — CULTURE, RESPIRATORY W GRAM STAIN

## 2013-12-03 LAB — VANCOMYCIN, TROUGH: Vancomycin Tr: 5.1 ug/mL — ABNORMAL LOW (ref 10.0–20.0)

## 2013-12-03 LAB — CULTURE, RESPIRATORY

## 2013-12-03 MED ORDER — MIDAZOLAM HCL 2 MG/2ML IJ SOLN
1.0000 mg | INTRAMUSCULAR | Status: DC | PRN
  Administered 2013-12-03: 1 mg via INTRAVENOUS

## 2013-12-03 MED ORDER — INSULIN ASPART 100 UNIT/ML ~~LOC~~ SOLN
0.0000 [IU] | Freq: Three times a day (TID) | SUBCUTANEOUS | Status: DC
  Administered 2013-12-03: 3 [IU] via SUBCUTANEOUS
  Administered 2013-12-04: 8 [IU] via SUBCUTANEOUS

## 2013-12-03 MED ORDER — VITAL AF 1.2 CAL PO LIQD
1000.0000 mL | ORAL | Status: DC
  Administered 2013-12-03 – 2013-12-04 (×2): 1000 mL
  Filled 2013-12-03 (×4): qty 1000

## 2013-12-03 MED ORDER — POTASSIUM CHLORIDE 10 MEQ/100ML IV SOLN
10.0000 meq | INTRAVENOUS | Status: AC
  Administered 2013-12-03 (×4): 10 meq via INTRAVENOUS
  Filled 2013-12-03: qty 100
  Filled 2013-12-03: qty 50
  Filled 2013-12-03: qty 100
  Filled 2013-12-03: qty 50
  Filled 2013-12-03: qty 100
  Filled 2013-12-03 (×2): qty 50
  Filled 2013-12-03: qty 100

## 2013-12-03 MED ORDER — METHYLPREDNISOLONE SODIUM SUCC 125 MG IJ SOLR
60.0000 mg | Freq: Four times a day (QID) | INTRAMUSCULAR | Status: DC
  Administered 2013-12-03 – 2013-12-05 (×9): 60 mg via INTRAVENOUS
  Filled 2013-12-03 (×9): qty 2

## 2013-12-03 MED ORDER — VITAL AF 1.2 CAL PO LIQD
1000.0000 mL | ORAL | Status: DC
  Administered 2013-12-03: 1000 mL
  Filled 2013-12-03 (×3): qty 1000

## 2013-12-03 MED ORDER — FUROSEMIDE 10 MG/ML IJ SOLN
40.0000 mg | Freq: Once | INTRAMUSCULAR | Status: AC
  Administered 2013-12-03: 40 mg via INTRAVENOUS
  Filled 2013-12-03: qty 4

## 2013-12-03 NOTE — Progress Notes (Signed)
NUTRITION FOLLOW UP  Intervention:   Increase Vital AF to 45 ml/hr to better meet nutritional needs. TF regimen at goal rate provides 1296 kcals, 81 grams protein, and 876 ml fluid daily. This meet 104% estimated calorie needs and 108% of estimated protein needs.   Nutrition Dx:   Inadequate oral intake related to inability to eat as evidenced by mechanical ventilation requiring NPO status.  Goal:   Pt to meet >/= 90% of their estimated nutrition needs; met  Monitor:   Respiratory status, TF tolerance, weight changes, labs, changes in status  Assessment:   Pt remains intubated on vent. Pt has failed weaning attempts to to increased heart rate, increase blood pressure, and agitation.  Pt was started on Vital AF @ 40 ml/hr, which provides 1152 kcals, 72 grams protein, and 779 ml fluid daily. This meets 92% of estimated calorie needs, 91% of estimated protein needs, and 65% of estimated fluid needs.  Patient is currently intubated on ventilator support.  MV: 5.4 L/min Temp (24hrs), Avg:98.6 F (37 C), Min:97.9 F (36.6 C), Max:99.8 F (37.7 C)  Propofol: n/a ml/hr  Height: Ht Readings from Last 1 Encounters:  12/03/13 _0  (1.575 m)    Weight Status:   Wt Readings from Last 1 Encounters:  12/03/13 139 lb 8.8 oz (63.3 kg)    Re-estimated needs:  Kcal: 1252 kcals daily Protein: 79-95 grams daily Fluid: 1.2-1.3 L daily  Skin: WDL  Diet Order: NPO   Intake/Output Summary (Last 24 hours) at 12/03/13 1336 Last data filed at 12/03/13 1226  Gross per 24 hour  Intake 1097.92 ml  Output   2900 ml  Net -1802.08 ml    Last BM: PTA   Labs:   Recent Labs Lab 12/01/13 1259 12/02/13 0421 12/03/13 0512 12/03/13 0513  NA 137 140  --  141  K 3.7 3.3*  --  2.6*  CL 103 107  --  105  CO2 25 23  --  26  BUN 22 22  --  20  CREATININE 0.76 0.77  --  0.77  CALCIUM 8.3* 8.3*  --  8.6  MG  --   --  1.7  --   GLUCOSE 122* 109*  --  104*    CBG (last 3)   Recent Labs  12/03/13 1204  GLUCAP 102*    Scheduled Meds: . ALPRAZolam  0.5 mg Oral TID WC   And  . ALPRAZolam  1 mg Oral QHS  . antiseptic oral rinse  15 mL Mouth Rinse QID  . chlorhexidine  15 mL Mouth Rinse BID  . Chlorhexidine Gluconate Cloth  6 each Topical Q0600  . docusate sodium  100 mg Oral BID  . fentaNYL  100 mcg Intravenous Once  . fentaNYL  50 mcg Intravenous Once  . heparin  5,000 Units Subcutaneous Q8H  . insulin aspart  0-15 Units Subcutaneous TID WC  . ipratropium-albuterol  3 mL Nebulization Q6H  . methylPREDNISolone (SOLU-MEDROL) injection  60 mg Intravenous Q6H  . midazolam  2 mg Intravenous Once  . mupirocin ointment  1 application Nasal BID  . oseltamivir  75 mg Per Tube BID  . pantoprazole (PROTONIX) IV  40 mg Intravenous Daily  . piperacillin-tazobactam (ZOSYN)  IV  3.375 g Intravenous Q8H  . sodium chloride  500 mL Intravenous Once  . sodium chloride  10-40 mL Intracatheter Q12H  . vancomycin  750 mg Intravenous Q24H    Continuous Infusions: . feeding supplement (VITAL AF 1.2 CAL)    .  fentaNYL infusion INTRAVENOUS 150 mcg/hr (12/03/13 1015)    Sabriel Borromeo A. Jimmye Norman, RD, LDN Pager: [phone]

## 2013-12-03 NOTE — Progress Notes (Signed)
Subjective: She remains intubated and on the ventilator. When he attempted to get her off she developed marked increase in heart rate and BP.   Objective: Vital signs in last 24 hours: Temp:  [97.9 F (36.6 C)-99.8 F (37.7 C)] 99.8 F (37.7 C) (01/03 0730) Pulse Rate:  [67-103] 100 (01/03 0645) Resp:  [12-33] 22 (01/03 0645) BP: (92-165)/(45-91) 165/91 mmHg (01/03 0645) SpO2:  [88 %-100 %] 100 % (01/03 0722) Arterial Line BP: (87-168)/(45-96) 141/57 mmHg (01/03 0449) FiO2 (%):  [35 %-40 %] 40 % (01/03 0946) Weight:  [63.3 kg (139 lb 8.8 oz)] 63.3 kg (139 lb 8.8 oz) (01/03 0500) Weight change: -0.5 kg (-1 lb 1.6 oz) Last BM Date:  (unknown)  Intake/Output from previous day: 01/02 0701 - 01/03 0700 In: 677.9 [I.V.:267.9; NG/GT:110; IV Piggyback:300] Out: 2900 [Urine:2900]  PHYSICAL EXAM General appearance: moderate distress and intubated and sedated Resp: rhonchi bilaterally Cardio: regular rate and rhythm, S1, S2 normal, no murmur, click, rub or gallop GI: soft, non-tender; bowel sounds normal; no masses,  no organomegaly Extremities: extremities normal, atraumatic, no cyanosis or edema  Lab Results:    Basic Metabolic Panel:  Recent Labs  12/02/13 0421 12/03/13 0512 12/03/13 0513  NA 140  --  141  K 3.3*  --  2.6*  CL 107  --  105  CO2 23  --  26  GLUCOSE 109*  --  104*  BUN 22  --  20  CREATININE 0.77  --  0.77  CALCIUM 8.3*  --  8.6  MG  --  1.7  --    Liver Function Tests:  Recent Labs  12/01/13 1259 12/02/13 0421  AST 14 10  ALT 11 9  ALKPHOS 52 49  BILITOT 0.5 0.6  PROT 5.4* 5.0*  ALBUMIN 2.3* 1.9*   No results found for this basename: LIPASE, AMYLASE,  in the last 72 hours No results found for this basename: AMMONIA,  in the last 72 hours CBC:  Recent Labs  12/02/13 0421 12/03/13 0513 12/03/13 1028  WBC 5.2 7.2 9.5  NEUTROABS 4.4  --  7.9*  HGB 8.7* 8.9* 9.7*  HCT 26.4* 26.6* 29.1*  MCV 87.1 85.8 85.8  PLT 216 279 315   Cardiac  Enzymes: No results found for this basename: CKTOTAL, CKMB, CKMBINDEX, TROPONINI,  in the last 72 hours BNP: No results found for this basename: PROBNP,  in the last 72 hours D-Dimer: No results found for this basename: DDIMER,  in the last 72 hours CBG: No results found for this basename: GLUCAP,  in the last 72 hours Hemoglobin A1C: No results found for this basename: HGBA1C,  in the last 72 hours Fasting Lipid Panel: No results found for this basename: CHOL, HDL, LDLCALC, TRIG, CHOLHDL, LDLDIRECT,  in the last 72 hours Thyroid Function Tests: No results found for this basename: TSH, T4TOTAL, FREET4, T3FREE, THYROIDAB,  in the last 72 hours Anemia Panel: No results found for this basename: VITAMINB12, FOLATE, FERRITIN, TIBC, IRON, RETICCTPCT,  in the last 72 hours Coagulation:  Recent Labs  12/02/13 0421  LABPROT 13.5  INR 1.05   Urine Drug Screen: Drugs of Abuse  No results found for this basename: labopia, cocainscrnur, labbenz, amphetmu, thcu, labbarb    Alcohol Level: No results found for this basename: ETH,  in the last 72 hours Urinalysis: No results found for this basename: COLORURINE, APPERANCEUR, LABSPEC, PHURINE, GLUCOSEU, HGBUR, BILIRUBINUR, KETONESUR, PROTEINUR, UROBILINOGEN, NITRITE, LEUKOCYTESUR,  in the last 72 hours Misc. Labs:  ABGS  Recent Labs  12/03/13 0410  PHART 7.408  PO2ART 110.0*  TCO2 14.3  HCO3 15.1*   CULTURES Recent Results (from the past 240 hour(s))  CULTURE, BLOOD (ROUTINE X 2)     Status: None   Collection Time    11/02/2013  2:34 PM      Result Value Range Status   Specimen Description BLOOD RIGHT ANTECUBITAL   Final   Special Requests     Final   Value: BOTTLES DRAWN AEROBIC AND ANAEROBIC AEB=12CC ANA=10CC   Culture NO GROWTH 4 DAYS   Final   Report Status PENDING   Incomplete  CULTURE, BLOOD (ROUTINE X 2)     Status: None   Collection Time    11/23/2013  2:40 PM      Result Value Range Status   Specimen Description BLOOD  RIGHT HAND   Final   Special Requests BOTTLES DRAWN AEROBIC AND ANAEROBIC 5CC   Final   Culture NO GROWTH 4 DAYS   Final   Report Status PENDING   Incomplete  MRSA PCR SCREENING     Status: Abnormal   Collection Time    11/08/2013  4:04 PM      Result Value Range Status   MRSA by PCR POSITIVE (*) NEGATIVE Final   Comment:            The GeneXpert MRSA Assay (FDA     approved for NASAL specimens     only), is one component of a     comprehensive MRSA colonization     surveillance program. It is not     intended to diagnose MRSA     infection nor to guide or     monitor treatment for     MRSA infections.     RESULT CALLED TO, READ BACK BY AND VERIFIED WITH:     COLLINS,B. AT 1906 ON 11/22/2013 BY BAUGHAM,M.  CULTURE, RESPIRATORY (NON-EXPECTORATED)     Status: None   Collection Time    11/30/13  3:59 PM      Result Value Range Status   Specimen Description TRACHEAL ASPIRATE   Final   Special Requests BLOODY SPECIMEN   Final   Gram Stain     Final   Value: ABUNDANT WBC PRESENT, PREDOMINANTLY PMN     RARE SQUAMOUS EPITHELIAL CELLS PRESENT     ABUNDANT GRAM POSITIVE COCCI IN PAIRS     IN CLUSTERS IN CHAINS     Performed at [facility]   Culture     Final   Value: ABUNDANT STAPHYLOCOCCUS AUREUS     Note: RIFAMPIN AND GENTAMICIN SHOULD NOT BE USED AS SINGLE DRUGS FOR TREATMENT OF STAPH INFECTIONS.     Performed at [facility]   Report Status PENDING   Incomplete  RESPIRATORY VIRUS PANEL     Status: Abnormal   Collection Time    11/30/13  6:00 PM      Result Value Range Status   Source - RVPAN NASOPHARYNGEAL SWAB   Corrected   Comment: CORRECTED ON 01/02 AT 2306: PREVIOUSLY REPORTED AS NASOPHARYNGEAL SWAB   Respiratory Syncytial Virus A NOT DETECTED   Final   Respiratory Syncytial Virus B NOT DETECTED   Final   Influenza A DETECTED (*)  Final   Influenza B NOT DETECTED   Final   Parainfluenza 1 NOT DETECTED   Final   Parainfluenza 2 NOT DETECTED   Final    Parainfluenza 3 NOT DETECTED   Final  Metapneumovirus NOT DETECTED   Final   Rhinovirus NOT DETECTED   Final   Adenovirus NOT DETECTED   Final   Influenza A H1 NOT DETECTED   Final   Influenza A H3 DETECTED (*)  Final   Comment: (NOTE)           Normal Reference Range for each Analyte: NOT DETECTED     Testing performed using the Luminex xTAG Respiratory Viral Panel test     kit.     This test was developed and its performance characteristics determined     by [facility]. It has not been cleared or approved by the [place]     Food and Drug Administration. This test is used for clinical purposes.     It should not be regarded as investigational or for research. This     laboratory is certified under the [place] (CLIA) as qualified to perform high complexity     clinical laboratory testing.     Performed at [facility]   Studies/Results: Dg Chest Port 1 View  12/03/2013   CLINICAL DATA:  Followup chest x-ray  EXAM: PORTABLE CHEST - 1 VIEW  COMPARISON:  Prior chest x-ray 12/02/2010 1,015  FINDINGS: The endotracheal tube is 1.4 cm above the carinal. Left subclavian approach central venous catheter with the tip in the mid SVC. Nasogastric tube tip lies below the field of view, presumably within the stomach. No significant grafts that no significant interval change in the appearance of the lungs with pulmonary vascular congestion, interstitial prominence and bibasilar opacities. Probable moderate layering right pleural effusion. Some evidence of volume loss in the left base with peaking of the hemidiaphragm.  IMPRESSION: Overall, no significant interval change in the appearance of the chest over the last 24 hr.  Persistent background of pulmonary vascular congestion and mild interstitial edema with small to moderate layering effusion on the right and bibasilar opacities which could reflect a combination of atelectasis and edema versus  infiltrate.  Stable support apparatus.   Electronically Signed   By: Jacqulynn Cadet M.D.   On: 12/03/2013 08:06   Dg Chest Port 1 View  12/02/2013   CLINICAL DATA:  Pneumonia.  EXAM: PORTABLE CHEST - 1 VIEW  COMPARISON:  12/01/2013  FINDINGS: Nasogastric tube is present coursing into the region of the stomach and off the inferior portion of the film. Left subclavian central venous catheter is unchanged. Endotracheal tube has tip approximately 3.5 cm above the carinal. Lungs are hypoinflated with slight worsening bilateral perihilar bibasilar opacification suggesting a moderate interstitial edema, although cannot exclude infection in the lung bases. Possible small amount of bilateral pleural fluid unchanged. Cardiomediastinal silhouette and remainder of the exam is unchanged.  IMPRESSION: Worsening bilateral perihilar bibasilar opacification suggesting interstitial edema, although cannot exclude infection. Possible small amount of bilateral pleural fluid unchanged.  Tubes and lines unchanged.   Electronically Signed   By: Marin Olp M.D.   On: 12/02/2013 07:59    Medications:  Prior to Admission:  Prescriptions prior to admission  Medication Sig Dispense Refill  . ALPRAZolam (XANAX) 0.5 MG tablet Take 0.5-1 mg by mouth 2 (two) times daily. One tablet in the morning and 2 at bedtime      . amLODipine (NORVASC) 2.5 MG tablet Take 2.5 mg by mouth daily.      Marland Kitchen aspirin EC 81 MG tablet Take 81 mg by mouth daily.      Marland Kitchen  Aspirin-Acetaminophen-Caffeine (GOODY HEADACHE PO) Take 1 packet by mouth daily as needed (headaches).      . benzonatate (TESSALON) 100 MG capsule Take 100 mg by mouth 3 (three) times daily as needed for cough.      . docusate sodium (COLACE) 100 MG capsule Take 100 mg by mouth 2 (two) times daily.       . DULoxetine (CYMBALTA) 30 MG capsule Take 30 mg by mouth daily.      Marland Kitchen HYDROcodone-acetaminophen (NORCO) 10-325 MG per tablet Take 1-2 tablets by mouth every 6 (six) hours as needed  for moderate pain.      . hydrocortisone (CORTEF) 20 MG tablet Take 10 mg by mouth daily.       . Loteprednol Etabonate (LOTEMAX) 0.5 % GEL Apply 1 drop to eye 3 (three) times daily.      Marland Kitchen lubiprostone (AMITIZA) 24 MCG capsule Take 24 mcg by mouth daily with breakfast.       . Multiple Vitamin (DAILY VITE PO) Take 1 tablet by mouth daily.      . pravastatin (PRAVACHOL) 80 MG tablet Take 1 tablet (80 mg total) by mouth daily.  30 tablet  0  . psyllium (METAMUCIL) 58.6 % packet Take 1 packet by mouth daily.       Scheduled: . ALPRAZolam  0.5 mg Oral TID WC   And  . ALPRAZolam  1 mg Oral QHS  . antiseptic oral rinse  15 mL Mouth Rinse QID  . chlorhexidine  15 mL Mouth Rinse BID  . Chlorhexidine Gluconate Cloth  6 each Topical Q0600  . docusate sodium  100 mg Oral BID  . fentaNYL  100 mcg Intravenous Once  . fentaNYL  50 mcg Intravenous Once  . heparin  5,000 Units Subcutaneous Q8H  . insulin aspart  0-15 Units Subcutaneous TID WC  . ipratropium-albuterol  3 mL Nebulization Q6H  . methylPREDNISolone (SOLU-MEDROL) injection  60 mg Intravenous Q6H  . midazolam  2 mg Intravenous Once  . mupirocin ointment  1 application Nasal BID  . oseltamivir  75 mg Per Tube BID  . pantoprazole (PROTONIX) IV  40 mg Intravenous Daily  . piperacillin-tazobactam (ZOSYN)  IV  3.375 g Intravenous Q8H  . potassium chloride  10 mEq Intravenous Q1 Hr x 4  . sodium chloride  500 mL Intravenous Once  . sodium chloride  10-40 mL Intracatheter Q12H  . vancomycin  750 mg Intravenous Q24H   Continuous: . feeding supplement (VITAL AF 1.2 CAL)    . fentaNYL infusion INTRAVENOUS 150 mcg/hr (12/03/13 1015)   SWH:QPRFFMBW, fentaNYL, midazolam, morphine injection, sodium chloride  Assesment: She has severe sepsis with septic shock. She has COPD which is better. She remains intubated and on a ventilator. She is not on steroids on one to start. She is start tube feedings today. Principal Problem:   Severe sepsis with  septic shock Active Problems:   DCIS (ductal carcinoma in situ) of breast   Hypoadrenalism   Hypertension   Hyperlipidemia   Dementia   Metabolic encephalopathy   Acute on chronic kidney disease, stage 3   HCAP (healthcare-associated pneumonia)    Plan: She will start steroids. She will start tube feedings. We will see if she can make any progress towards extubation.    LOS: 4 days   Lashaunda Schild L 12/03/2013, 10:55 AM

## 2013-12-03 NOTE — Progress Notes (Addendum)
MANE CONSOLO EQA:[id number] DOB: 09-22-1927 DOA: 11/14/2013 PCP: Glo Herring., MD  Brief narrative: 80 y.o. female none HTN, HLD, dementia, chronic constipation and diarrhea, adrenal insufficiency, acid reflux, breast cancer currently in remission and last radiation treatment approximately 9 months ago, anxiety and depression who presents with altered mental status  came to [facility] ed from [facility] 11/15/2013 with flu-like symptoms.  She was most recently seen 12/27 in the emergency room where she was noted to have a nonproductive cough temperature 99.1 and triaged home as chest x-ray was negative. She returned to the emergency room 12/30 and was found to have widespread pneumonia, positive H. Influenza She was started on broad-spectrum antibiotic as well as Tamiflu-BNP was 451, lactic acid 1.1 Unfortunately she had significant confusion and has a baseline of dementia  Patient subsequently decompensated 12/31 requiring intubation as well as placement of lines. Blood gas showed impending type II respiratory failure, 7.35/45.3 [compared to prior admission abg].  W  pro calcitonin trend 12>>>35>>18 Developed ATN secondary to moderate oliguria and transiently was given volume but then developed plethora on chest x-ray CT abdomen chest = bilateral lower lobe, right middle lobe, lingular consolidations most likely pneumonia-small bilateral pleural effusions cannot rule out malignancy   Past medical history-As per Problem list Chart reviewed as below-  Consultants:  None  Procedures:  Chest x-ray 12/30  Chest x-ray 12/31  Chest x-ray 1/1  Chest x-ray 1/2  Chest x-ray 12/03/13 = overall unchanged, worsening bibasilar opacification suggesting interstitial edema, small amount of bilateral pleural fluid  CT abdomen pelvis 12/31   ET tube 12/31 , 22 cm   right subclavian line 12/31  Art line 12/31  NG tube 12/31  Antibiotics:  Cefepime 12/30>>12/01/13  Zosyn  12/01/13>>>>>  Vancomycin 12/30>>>>  Tamiflu 12/30  Sputum cult-Staphy ++  Resp virus panel H flu A3  Blood cultures neg x 4 days  Strep pneumo neg  Legionella neg  HIV negative    Subjective  Patient on ventilator. Nursing reports doing fair. Oliguric this morning only 300 cc out. No diarrhea.    Objective    Interim History: Vent Mode:  [-] PRVC FiO2 (%):  [35 %-40 %] 35 % Set Rate:  [12 bmp] 12 bmp Vt Set:  [450 mL] 450 mL PEEP:  [5 cmH20] 5 cmH20 Pressure Support:  [12 cmH20] 12 cmH20 Plateau Pressure:  [18 cmH20-23 cmH20] 20 cmH20  CVP 8 now RASS -1 FiO2 decreased 35%  Telemetry: Sinus tachycardia   Objective: Filed Vitals:   12/03/13 0615 12/03/13 0630 12/03/13 0645 12/03/13 0722  BP: 145/65 142/83 165/91   Pulse: 83 93 100   Temp:      TempSrc:      Resp: _0 Height:      Weight:      SpO2: 100% 95% 88% 100%    Intake/Output Summary (Last 24 hours) at 12/03/13 0746 Last data filed at 12/03/13 0600  Gross per 24 hour  Intake 677.92 ml  Output   2900 ml  Net -2222.08 ml    Exam:  General: EOMI, ETT-AROUSES  Cardiovascular: S1-S2 tachycardic bounding pulses, JVD about 8 cm Respiratory: Coarse bilateral crepitations  Abdomen: Soft nontender nondistended Skin no lower extremity edema, good dorsalis pedis, warm to touch Neuro confused moving all 4 limbs however  Data Reviewed: Basic Metabolic Panel:  Recent Labs Lab 11/30/13 0236 12/01/13 0440 12/01/13 1259 12/02/13 0421 12/03/13 0513  NA 135* 139 137 140 141  K 3.5*  3.7 3.7 3.3* 2.6*  CL 95* 101 103 107 105  CO2 _0 GLUCOSE 161* 123* 122* 109* 104*  BUN 11 24* _1 CREATININE 0.70 0.91 0.76 0.77 0.77  CALCIUM 8.8 8.3* 8.3* 8.3* 8.6   Liver Function Tests:  Recent Labs Lab 11/16/2013 1233 11/30/13 0236 12/01/13 0440 12/01/13 1259 12/02/13 0421  AST _2 ALT _3 ALKPHOS 81 58 55 52 49  BILITOT 0.4 0.4 0.5 0.5 0.6  PROT  7.2 6.7 5.5* 5.4* 5.0*  ALBUMIN 3.8 3.2* 2.3* 2.3* 1.9*   No results found for this basename: LIPASE, AMYLASE,  in the last 168 hours No results found for this basename: AMMONIA,  in the last 168 hours CBC:  Recent Labs Lab 11/08/2013 1233 11/30/13 0236 12/01/13 0440 12/02/13 0421 12/03/13 0513  WBC 8.6 7.3 6.0 5.2 7.2  NEUTROABS 7.5 5.8 5.3 4.4  --   HGB 13.1 12.8 10.0* 8.7* 8.9*  HCT 38.8 37.6 29.7* 26.4* 26.6*  MCV 86.2 85.6 86.6 87.1 85.8  PLT 223 200 200 216 279   Cardiac Enzymes:  Recent Labs Lab 11/26/2013 1433 11/15/2013 2013 11/30/13 0236  TROPONINI <0.30 <0.30 <0.30   BNP: No components found with this basename: POCBNP,  CBG: No results found for this basename: GLUCAP,  in the last 168 hours  Recent Results (from the past 240 hour(s))  CULTURE, BLOOD (ROUTINE X 2)     Status: None   Collection Time    11/28/2013  2:34 PM      Result Value Range Status   Specimen Description BLOOD RIGHT ANTECUBITAL   Final   Special Requests     Final   Value: BOTTLES DRAWN AEROBIC AND ANAEROBIC AEB=12CC ANA=10CC   Culture NO GROWTH 4 DAYS   Final   Report Status PENDING   Incomplete  CULTURE, BLOOD (ROUTINE X 2)     Status: None   Collection Time    10/31/2013  2:40 PM      Result Value Range Status   Specimen Description BLOOD RIGHT HAND   Final   Special Requests BOTTLES DRAWN AEROBIC AND ANAEROBIC 5CC   Final   Culture NO GROWTH 4 DAYS   Final   Report Status PENDING   Incomplete  MRSA PCR SCREENING     Status: Abnormal   Collection Time    11/28/2013  4:04 PM      Result Value Range Status   MRSA by PCR POSITIVE (*) NEGATIVE Final   Comment:            The GeneXpert MRSA Assay (FDA     approved for NASAL specimens     only), is one component of a     comprehensive MRSA colonization     surveillance program. It is not     intended to diagnose MRSA     infection nor to guide or     monitor treatment for     MRSA infections.     RESULT CALLED TO, READ BACK BY AND  VERIFIED WITH:     COLLINS,B. AT 1906 ON 11/13/2013 BY BAUGHAM,M.  CULTURE, RESPIRATORY (NON-EXPECTORATED)     Status: None   Collection Time    11/30/13  3:59 PM      Result Value Range Status   Specimen Description TRACHEAL ASPIRATE   Final   Special Requests BLOODY SPECIMEN   Final   Gram Stain  Final   Value: ABUNDANT WBC PRESENT, PREDOMINANTLY PMN     RARE SQUAMOUS EPITHELIAL CELLS PRESENT     ABUNDANT GRAM POSITIVE COCCI IN PAIRS     IN CLUSTERS IN CHAINS     Performed at [facility]   Culture     Final   Value: ABUNDANT STAPHYLOCOCCUS AUREUS     Note: RIFAMPIN AND GENTAMICIN SHOULD NOT BE USED AS SINGLE DRUGS FOR TREATMENT OF STAPH INFECTIONS.     Performed at [facility]   Report Status PENDING   Incomplete  RESPIRATORY VIRUS PANEL     Status: Abnormal   Collection Time    11/30/13  6:00 PM      Result Value Range Status   Source - RVPAN NASOPHARYNGEAL SWAB   Corrected   Comment: CORRECTED ON 01/02 AT 2306: PREVIOUSLY REPORTED AS NASOPHARYNGEAL SWAB   Respiratory Syncytial Virus A NOT DETECTED   Final   Respiratory Syncytial Virus B NOT DETECTED   Final   Influenza A DETECTED (*)  Final   Influenza B NOT DETECTED   Final   Parainfluenza 1 NOT DETECTED   Final   Parainfluenza 2 NOT DETECTED   Final   Parainfluenza 3 NOT DETECTED   Final   Metapneumovirus NOT DETECTED   Final   Rhinovirus NOT DETECTED   Final   Adenovirus NOT DETECTED   Final   Influenza A H1 NOT DETECTED   Final   Influenza A H3 DETECTED (*)  Final   Comment: (NOTE)           Normal Reference Range for each Analyte: NOT DETECTED     Testing performed using the Luminex xTAG Respiratory Viral Panel test     kit.     This test was developed and its performance characteristics determined     by [facility]. It has not been cleared or approved by the [place]     Food and Drug Administration. This test is used for clinical purposes.     It should not be regarded as investigational  or for research. This     laboratory is certified under the [place] (CLIA) as qualified to perform high complexity     clinical laboratory testing.     Performed at [facility]     Studies:              All Imaging reviewed and is as per above notation   Scheduled Meds: . ALPRAZolam  0.5 mg Oral TID WC   And  . ALPRAZolam  1 mg Oral QHS  . antiseptic oral rinse  15 mL Mouth Rinse QID  . chlorhexidine  15 mL Mouth Rinse BID  . Chlorhexidine Gluconate Cloth  6 each Topical Q0600  . docusate sodium  100 mg Oral BID  . fentaNYL  100 mcg Intravenous Once  . fentaNYL  50 mcg Intravenous Once  . heparin  5,000 Units Subcutaneous Q8H  . insulin aspart  0-15 Units Subcutaneous TID WC  . ipratropium-albuterol  3 mL Nebulization Q6H  . methylPREDNISolone (SOLU-MEDROL) injection  60 mg Intravenous Q6H  . midazolam  2 mg Intravenous Once  . mupirocin ointment  1 application Nasal BID  . oseltamivir  75 mg Per Tube BID  . pantoprazole (PROTONIX) IV  40 mg Intravenous Daily  . piperacillin-tazobactam (ZOSYN)  IV  3.375 g Intravenous Q8H  . potassium chloride  10 mEq Intravenous Q1  Hr x 4  . sodium chloride  500 mL Intravenous Once  . sodium chloride  10-40 mL Intracatheter Q12H  . vancomycin  750 mg Intravenous Q24H   Continuous Infusions: . fentaNYL infusion INTRAVENOUS Stopped (12/03/13 0700)     1.        Acute type I respiratory failure- sedated and intubated 12/31-appreciate pulmonary input. Will keep on PRvC at above settings-defer changes to her change of mode to pulmonology , may benefit from one to 2 more days of ventilator care-Rpt gas this am shows improvement in hypercarbia.attempt TBAR trial this am?  Will discuss with Pulmonary-attempt again to wean 2. Severe Septic shock 2/2 Multi-organ dysfunction 2/2 to H Flu+ secondary bacterial pneumonia-  Sputum=++Staph, Blood cultures neg x 4 days.  Pro calcitonin not drawn yet  this morning-depending on result of this, we'll de-escalate antibiotic to cover mainly Staphylococcus in the next 1-2 days 3. Iatrogenic Vol oveload-CP 15>>8 this am.  IVF d/c 12/02/13 didn't iatrogenic overload-. Keep volume even. clinically has no history of COPD and therefore I'm not sure steroids will add much although I agree with trial of use. For now we'll give 1 dose of IV Lasix 40 mg and monitor urine output.   4. H. Influenza positive-continue Tamiflu at this stage-no evidence to suggest greater than 5 days is helpful. Discontinue Tamiflu 5. Dilutional anemia-Hemoccult x1 negative.  Would not workup further at this stage.  Also component of iatrogenic at loss from multiple lab draws.   6. ICU hyperglycemia-within the parameters, monitor-if we're starting steroids, we will to add sliding scale coverage 7. Acute metabolic encephalopathy-likely secondary to hypoxia/contributor = stage III to 5 dementia at baseline 8. DCIS (ductal carcinoma in situ) of breast-Stable at present outpatient followup. If no improvement consider CT scan chest and discussion with family  9. Hypoadrenalism-see above.  Received one dose of Solu-Medrol 12/30-given CVP is better, blood pressure is stable by art line, will not give any more stress dosing. We'll get a random a.m. cortisol a.m. 1/14 10. Hypertension/with transient spells of hypotension when asleep  amlodipine low-dose 2.5 mg discontinued 11. FEN-agree with trial of tube feeds 12. Hypokalemia-give 5 rounds of IV potassium 12/03/13, likely secondary to IV Lasix given on 12/02/48 13. Hyperlipidemia-statin on hold   14. Dementia-at least stage III or 4. She comes [facility] the [facility] [place] [facility]  15. Metabolic encephalopathy-see above  16. Acute on chronic kidney disease, stage 3-norma-see #2 17. Elevated proBNP-unclear how to interpret lab value of 451 in a setting of sepsis. Would hold off on Lasix/repeat echocardiogram as this was done 08/26/13   Today  summary Keep I./O. Stable-clinically volume overload still with elevated JVP. Lasix x1 today-slightly oliguric. Monitor I/O. Attempt T-Bar trial this morning with respiratory Delineate antibiotics further tomorrow with pro calcitonin as well as x-ray Get random a.m. cortisol tomorrow-IV steroids started this morning to consider pulmonary component to this although no history of COPD Will be attempting tube feeds   Code Status: Full Family Communication: none at bedside.   Disposition Plan: sdu   >60 MIN cc TIME  Verneita Griffes, MD  [facility] Pager [phone] 12/03/2013, 7:46 AM    LOS: 4 days

## 2013-12-04 ENCOUNTER — Inpatient Hospital Stay (HOSPITAL_COMMUNITY): Payer: Medicare Other

## 2013-12-04 DIAGNOSIS — I517 Cardiomegaly: Secondary | ICD-10-CM

## 2013-12-04 LAB — RENAL FUNCTION PANEL
Albumin: 2.1 g/dL — ABNORMAL LOW (ref 3.5–5.2)
BUN: 24 mg/dL — ABNORMAL HIGH (ref 6–23)
CHLORIDE: 101 meq/L (ref 96–112)
CO2: 30 mEq/L (ref 19–32)
Calcium: 8.9 mg/dL (ref 8.4–10.5)
Creatinine, Ser: 0.74 mg/dL (ref 0.50–1.10)
GFR calc Af Amer: 88 mL/min — ABNORMAL LOW (ref >90)
GFR calc non Af Amer: 76 mL/min — ABNORMAL LOW (ref >90)
GLUCOSE: 296 mg/dL — AB (ref 70–99)
PHOSPHORUS: 2.8 mg/dL (ref 2.3–4.6)
POTASSIUM: 2.8 meq/L — AB (ref 3.7–5.3)
Sodium: 143 mEq/L (ref 137–147)

## 2013-12-04 LAB — CULTURE, BLOOD (ROUTINE X 2)
Culture: NO GROWTH
Culture: NO GROWTH

## 2013-12-04 LAB — GLUCOSE, CAPILLARY
Glucose-Capillary: 236 mg/dL — ABNORMAL HIGH (ref 70–99)
Glucose-Capillary: 259 mg/dL — ABNORMAL HIGH (ref 70–99)
Glucose-Capillary: 281 mg/dL — ABNORMAL HIGH (ref 70–99)
Glucose-Capillary: 270 mg/dL — ABNORMAL HIGH (ref 70–99)
Glucose-Capillary: 197 mg/dL — ABNORMAL HIGH (ref 70–99)
Glucose-Capillary: 208 mg/dL — ABNORMAL HIGH (ref 70–99)

## 2013-12-04 LAB — CORTISOL: Cortisol, Plasma: 48.4 ug/dL

## 2013-12-04 LAB — PRO B NATRIURETIC PEPTIDE: Pro B Natriuretic peptide (BNP): 4082 pg/mL — ABNORMAL HIGH (ref 0–450)

## 2013-12-04 LAB — CORTISOL-AM, BLOOD: Cortisol - AM: 37.7 ug/dL — ABNORMAL HIGH (ref 4.3–22.4)

## 2013-12-04 MED ORDER — INSULIN ASPART 100 UNIT/ML ~~LOC~~ SOLN: 0.0000 [IU] | Freq: Three times a day (TID) | SUBCUTANEOUS | Status: DC

## 2013-12-04 MED ORDER — POTASSIUM CHLORIDE 10 MEQ/100ML IV SOLN
10.0000 meq | INTRAVENOUS | Status: AC
  Administered 2013-12-04 (×6): 10 meq via INTRAVENOUS
  Filled 2013-12-04 (×6): qty 100

## 2013-12-04 MED ORDER — POTASSIUM CHLORIDE 20 MEQ/15ML (10%) PO LIQD
40.0000 meq | Freq: Once | ORAL | Status: AC
  Administered 2013-12-04: 40 meq via ORAL
  Filled 2013-12-04: qty 30

## 2013-12-04 MED ORDER — ATORVASTATIN CALCIUM 20 MG PO TABS: 20.0000 mg | ORAL_TABLET | Freq: Every day | ORAL | Status: DC

## 2013-12-04 MED ORDER — SODIUM CHLORIDE 0.9 % IV SOLN
0.0000 ug/h | INTRAVENOUS | Status: DC
  Administered 2013-12-05: 150 ug/h via INTRAVENOUS
  Filled 2013-12-04: qty 50

## 2013-12-04 MED ORDER — MAGNESIUM SULFATE IN D5W 10-5 MG/ML-% IV SOLN: 1.0000 g | Freq: Once | INTRAVENOUS | Status: DC

## 2013-12-04 MED ORDER — MIDAZOLAM HCL 50 MG/10ML IJ SOLN
INTRAMUSCULAR | Status: AC
  Filled 2013-12-04: qty 1

## 2013-12-04 MED ORDER — MIDAZOLAM BOLUS VIA INFUSION
1.0000 mg | INTRAVENOUS | Status: DC | PRN
  Filled 2013-12-04: qty 2

## 2013-12-04 MED ORDER — INSULIN DETEMIR 100 UNIT/ML ~~LOC~~ SOLN
10.0000 [IU] | Freq: Every day | SUBCUTANEOUS | Status: DC
  Administered 2013-12-04 – 2013-12-06 (×3): 10 [IU] via SUBCUTANEOUS
  Filled 2013-12-04 (×3): qty 0.1

## 2013-12-04 MED ORDER — FENTANYL CITRATE 0.05 MG/ML IJ SOLN
INTRAMUSCULAR | Status: AC
  Filled 2013-12-04: qty 50

## 2013-12-04 MED ORDER — FUROSEMIDE 40 MG PO TABS
40.0000 mg | ORAL_TABLET | Freq: Once | ORAL | Status: AC
  Administered 2013-12-04: 40 mg via ORAL
  Filled 2013-12-04: qty 1

## 2013-12-04 MED ORDER — MIDAZOLAM HCL 5 MG/ML IJ SOLN
0.0000 mg/h | INTRAMUSCULAR | Status: DC
  Administered 2013-12-04: 1 mg/h via INTRAVENOUS
  Administered 2013-12-05: 10 mg/h via INTRAVENOUS
  Administered 2013-12-05: 3 mg/h via INTRAVENOUS
  Administered 2013-12-05 – 2013-12-06 (×4): 10 mg/h via INTRAVENOUS
  Filled 2013-12-04 (×8): qty 10

## 2013-12-04 MED ORDER — INSULIN ASPART 100 UNIT/ML ~~LOC~~ SOLN
0.0000 [IU] | SUBCUTANEOUS | Status: DC
  Administered 2013-12-04: 4 [IU] via SUBCUTANEOUS
  Administered 2013-12-04: 11 [IU] via SUBCUTANEOUS
  Administered 2013-12-04: 7 [IU] via SUBCUTANEOUS
  Administered 2013-12-05 (×4): 4 [IU] via SUBCUTANEOUS
  Administered 2013-12-05: 7 [IU] via SUBCUTANEOUS
  Administered 2013-12-06 (×3): 3 [IU] via SUBCUTANEOUS
  Administered 2013-12-06: 2 [IU] via SUBCUTANEOUS
  Administered 2013-12-06: 3 [IU] via SUBCUTANEOUS
  Administered 2013-12-06 – 2013-12-07 (×2): 4 [IU] via SUBCUTANEOUS
  Administered 2013-12-07: 7 [IU] via SUBCUTANEOUS
  Administered 2013-12-07: 3 [IU] via SUBCUTANEOUS
  Administered 2013-12-07: 4 [IU] via SUBCUTANEOUS
  Administered 2013-12-07: 08:00:00 via SUBCUTANEOUS
  Administered 2013-12-07: 4 [IU] via SUBCUTANEOUS
  Administered 2013-12-08 (×2): 3 [IU] via SUBCUTANEOUS
  Administered 2013-12-08: 4 [IU] via SUBCUTANEOUS
  Administered 2013-12-08: 11 [IU] via SUBCUTANEOUS
  Administered 2013-12-09: 3 [IU] via SUBCUTANEOUS
  Administered 2013-12-09: 4 [IU] via SUBCUTANEOUS

## 2013-12-04 MED ORDER — MAGNESIUM SULFATE 40 MG/ML IJ SOLN
1.0000 g | Freq: Once | INTRAMUSCULAR | Status: AC
  Administered 2013-12-04: 1 g via INTRAVENOUS
  Filled 2013-12-04: qty 50

## 2013-12-04 MED ORDER — FUROSEMIDE 10 MG/ML IJ SOLN
40.0000 mg | Freq: Once | INTRAMUSCULAR | Status: AC
  Administered 2013-12-04: 40 mg via INTRAVENOUS
  Filled 2013-12-04: qty 4

## 2013-12-04 MED ORDER — VANCOMYCIN HCL IN DEXTROSE 750-5 MG/150ML-% IV SOLN
750.0000 mg | Freq: Two times a day (BID) | INTRAVENOUS | Status: DC
  Administered 2013-12-04 – 2013-12-07 (×7): 750 mg via INTRAVENOUS
  Filled 2013-12-04 (×9): qty 150

## 2013-12-04 NOTE — Progress Notes (Signed)
At 1530hr RT doppled patient on right radial artery , patient had positive circulation. RT attempted times 2 to insert arterial line with no success. Respiratory Therapist Thelma Comp attempted times one also to insert arterial line with no success. Sterile procedure and technique used. RN made aware of unsuccessful attempt.No further attempts made at this time.

## 2013-12-04 NOTE — Progress Notes (Addendum)
[facility]                                                                                Patient Demographics  Bonnie [name], is a 80 y.o. female, DOB - Dec 27, 1926, [place] date - 11/10/2013   Admitting Physician Nita Sells, MD  Outpatient Primary MD for the patient is Glo Herring., MD  LOS - 5   Chief Complaint  Patient presents with  . Influenza      Brief narrative:   80 y.o. female none HTN, HLD, dementia, chronic constipation and diarrhea, adrenal insufficiency, acid reflux, breast cancer currently in remission and last radiation treatment approximately 9 months ago, anxiety and depression who presents with altered mental status, came to [facility] ed from [facility] 11/02/2013 with flu-like symptoms.   She was admitted with acute respiratory failure secondary to combination of possible HCAP with MRSA-positive ventricular spread along with influenza pneumonia. She is been kept on appropriate antibiotics along with Tamiflu however continues to be on mechanical ventilation under the guidance of pulmonary. Of note proBNP has been elevated.       Assessment & Plan    1. Acute respiratory failure requiring endotracheal intubation and mechanical ventilation secondary to influenza A pneumonia along with sublingual sun pneumonia. - Continue on vancomycin along with Tamiflu, supportive care with nebulizer treatments and mechanical ventilation, pulmonary following. Gentle diuresis as permitted by blood pressure for possible mild edema on chest x-ray. Prognosis appears poor.     2.Severe sepsis with septic shock - secondary to #1 above, probenecid and trend improving, continue antibiotic support monitor blood culture results. Continue steroid support, will check baseline cortisol.     3. Elevated pro BNP, chest x-ray with questionable CHF. Blood pressure is borderline low, will check echogram to rule out underlying CHF, gentle diuresis with Lasix as  tolerated by blood pressure.     4. History of dementia - from the medication it in [place] state, supportive care for now. She has mild metabolic encephalopathy due to combination of sedation and acute sickness due to above related problems.     5. History of dyslipidemia. Holding statin for now. Resume once taking oral diet and clinically better.     6. Steroid-induced hyperglycemia. Placed on Levemir along with sliding scale insulin we'll monitor CBGs .  CBG (last 3)   Recent Labs  12/04/13 0020 12/04/13 0443 12/04/13 0739  GLUCAP 236* 259* 281*      7. Hypokalemia. Replaced aggressively with IV and oral supplementation, monitor potassium along with magnesium closely. Active Problems:     8. History of  DCIS (ductal carcinoma in situ) of breast - outpatient followup was improved.     9. L.UPJ Uretric stone on CT scan - nonobstructing, will check renal ultrasound to document stability.       Code Status: Full  Family Communication:    Disposition Plan: TBD   Procedures   Echocardiogram ordered 12/04/2013   CT Chest + abdomen pelvis 12/31    ET tube 12/31 , 22 cm    right subclavian line 12/31    Art line 12/31    NG tube 12/31    Consults  pulmonary   Medications  Scheduled Meds: . ALPRAZolam  0.5 mg Oral TID WC   And  . ALPRAZolam  1 mg Oral QHS  . antiseptic oral rinse  15 mL Mouth Rinse QID  . chlorhexidine  15 mL Mouth Rinse BID  . Chlorhexidine Gluconate Cloth  6 each Topical Q0600  . docusate sodium  100 mg Oral BID  . fentaNYL  100 mcg Intravenous Once  . fentaNYL  50 mcg Intravenous Once  . furosemide  40 mg Oral Once  . heparin  5,000 Units Subcutaneous Q8H  . insulin aspart  0-20 Units Subcutaneous TID WC  . insulin detemir  10 Units Subcutaneous QHS  . ipratropium-albuterol  3 mL Nebulization Q6H  . methylPREDNISolone (SOLU-MEDROL) injection  60 mg Intravenous Q6H  . mupirocin ointment  1 application Nasal  BID  . pantoprazole (PROTONIX) IV  40 mg Intravenous Daily  . potassium chloride  10 mEq Intravenous Q1 Hr x 6  . sodium chloride  500 mL Intravenous Once  . sodium chloride  10-40 mL Intracatheter Q12H  . vancomycin  750 mg Intravenous Q12H   Continuous Infusions: . feeding supplement (VITAL AF 1.2 CAL) 1,000 mL (12/04/13 0300)  . fentaNYL infusion INTRAVENOUS Stopped (12/04/13 1005)   PRN Meds:.fentaNYL, midazolam, morphine injection, sodium chloride  DVT Prophylaxis   Heparin    Lab Results  Component Value Date   PLT 315 12/03/2013    Antibiotics   Anti-infectives   Start     Dose/Rate Route Frequency Ordered Stop   12/04/13 1000  vancomycin (VANCOCIN) IVPB 750 mg/150 ml premix     750 mg 150 mL/hr over 60 Minutes Intravenous Every 12 hours 12/04/13 0755     12/02/13 1130  oseltamivir (TAMIFLU) 6 MG/ML suspension 75 mg     75 mg Per Tube 2 times daily 12/02/13 1125 12/04/13 1007   12/01/13 1000  piperacillin-tazobactam (ZOSYN) IVPB 3.375 g  Status:  Discontinued     3.375 g 12.5 mL/hr over 240 Minutes Intravenous Every 8 hours 12/01/13 0741 12/04/13 1037   11/30/13 1600  vancomycin (VANCOCIN) IVPB 1000 mg/200 mL premix  Status:  Discontinued     1,000 mg 200 mL/hr over 60 Minutes Intravenous Every 24 hours 11/03/2013 1631 11/10/2013 1636   11/30/13 1600  vancomycin (VANCOCIN) IVPB 750 mg/150 ml premix  Status:  Discontinued     750 mg 150 mL/hr over 60 Minutes Intravenous Every 24 hours 11/25/2013 1636 12/04/13 0755   11/30/13 1400  ceFEPIme (MAXIPIME) 1 g in dextrose 5 % 50 mL IVPB  Status:  Discontinued     1 g 100 mL/hr over 30 Minutes Intravenous Every 24 hours 11/12/2013 1631 12/01/13 0739   11/30/13 0230  oseltamivir (TAMIFLU) capsule 75 mg  Status:  Discontinued     75 mg Oral 2 times daily 11/30/13 0159 12/02/13 1125   11/18/2013 1600  ceFEPIme (MAXIPIME) 1 g in dextrose 5 % 50 mL IVPB  Status:  Discontinued     1 g 100 mL/hr over 30 Minutes Intravenous 3 times per day  11/02/2013 1557 11/16/2013 1629   11/15/2013 1415  vancomycin (VANCOCIN) IVPB 1000 mg/200 mL premix     1,000 mg 200 mL/hr over 60 Minutes Intravenous  Once 11/04/2013 1409 11/16/2013 1627   11/17/2013 1400  ceFEPIme (MAXIPIME) 1 g in dextrose 5 % 50 mL IVPB     1 g 100 mL/hr over 30 Minutes Intravenous  Once 11/17/2013 1349 11/26/2013 1504  Subjective:   Bonnie [name] today remains intubated and sedated in bed does not appear to be in any discomfort  Objective:   Filed Vitals:   12/04/13 0900 12/04/13 1000 12/04/13 1014 12/04/13 1018  BP: 101/44     Pulse: 71 92 79   Temp:      TempSrc:      Resp: 12  9   Height:    '5\' 2"'$  (1.575 m)  Weight:      SpO2: 99% 99% 99%     Wt Readings from Last 3 Encounters:  12/04/13 64.1 kg (141 lb 5 oz)  11/26/13 49.896 kg (110 lb)  10/24/13 52.164 kg (115 lb)     Intake/Output Summary (Last 24 hours) at 12/04/13 1057 Last data filed at 12/04/13 0600  Gross per 24 hour  Intake 1535.17 ml  Output   2950 ml  Net -1414.83 ml    Exam Intubated and sedated, moves all 4 extremities to painful stimuli [place].AT,PERRAL Supple Neck,No JVD, No cervical lymphadenopathy appriciated.  Symmetrical Chest wall movement, Good air movement bilaterally, coarse bilateral breath sounds but no wheezing RRR,No Gallops,Rubs or new Murmurs, No Parasternal Heave +ve B.Sounds, Abd Soft, Non tender, No organomegaly appriciated, No rebound - guarding or rigidity. No Cyanosis, Clubbing or edema, No new Rash or bruise      Data Review   Micro Results Recent Results (from the past 240 hour(s))  CULTURE, BLOOD (ROUTINE X 2)     Status: None   Collection Time    11/13/2013  2:34 PM      Result Value Range Status   Specimen Description BLOOD RIGHT ANTECUBITAL   Final   Special Requests     Final   Value: BOTTLES DRAWN AEROBIC AND ANAEROBIC AEB=12CC ANA=10CC   Culture NO GROWTH 5 DAYS   Final   Report Status 12/04/2013 FINAL   Final  CULTURE, BLOOD (ROUTINE X 2)      Status: None   Collection Time    11/12/2013  2:40 PM      Result Value Range Status   Specimen Description BLOOD RIGHT HAND   Final   Special Requests BOTTLES DRAWN AEROBIC AND ANAEROBIC 5CC   Final   Culture NO GROWTH 5 DAYS   Final   Report Status 12/04/2013 FINAL   Final  MRSA PCR SCREENING     Status: Abnormal   Collection Time    11/26/2013  4:04 PM      Result Value Range Status   MRSA by PCR POSITIVE (*) NEGATIVE Final   Comment:            The GeneXpert MRSA Assay (FDA     approved for NASAL specimens     only), is one component of a     comprehensive MRSA colonization     surveillance program. It is not     intended to diagnose MRSA     infection nor to guide or     monitor treatment for     MRSA infections.     RESULT CALLED TO, READ BACK BY AND VERIFIED WITH:     COLLINS,B. AT 1906 ON 11/02/2013 BY BAUGHAM,M.  CULTURE, RESPIRATORY (NON-EXPECTORATED)     Status: None   Collection Time    11/30/13  3:59 PM      Result Value Range Status   Specimen Description TRACHEAL ASPIRATE   Final   Special Requests BLOODY SPECIMEN   Final   Gram Stain     Final  Value: ABUNDANT WBC PRESENT, PREDOMINANTLY PMN     RARE SQUAMOUS EPITHELIAL CELLS PRESENT     ABUNDANT GRAM POSITIVE COCCI IN PAIRS     IN CLUSTERS IN CHAINS     Performed at [facility]   Culture     Final   Value: ABUNDANT METHICILLIN RESISTANT STAPHYLOCOCCUS AUREUS     Note: RIFAMPIN AND GENTAMICIN SHOULD NOT BE USED AS SINGLE DRUGS FOR TREATMENT OF STAPH INFECTIONS. This organism is presumed to be Clindamycin resistant based on detection of inducible Clindamycin resistance. CRITICAL RESULT CALLED TO, READ BACK BY AND      VERIFIED WITH: HAWKINS RN 135PM 12/03/13 GUSTK     Performed at [facility]   Report Status 12/03/2013 FINAL   Final   Organism ID, Bacteria METHICILLIN RESISTANT STAPHYLOCOCCUS AUREUS   Final  RESPIRATORY VIRUS PANEL     Status: Abnormal   Collection Time    11/30/13  6:00 PM       Result Value Range Status   Source - RVPAN NASOPHARYNGEAL SWAB   Corrected   Comment: CORRECTED ON 01/02 AT 2306: PREVIOUSLY REPORTED AS NASOPHARYNGEAL SWAB   Respiratory Syncytial Virus A NOT DETECTED   Final   Respiratory Syncytial Virus B NOT DETECTED   Final   Influenza A DETECTED (*)  Final   Influenza B NOT DETECTED   Final   Parainfluenza 1 NOT DETECTED   Final   Parainfluenza 2 NOT DETECTED   Final   Parainfluenza 3 NOT DETECTED   Final   Metapneumovirus NOT DETECTED   Final   Rhinovirus NOT DETECTED   Final   Adenovirus NOT DETECTED   Final   Influenza A H1 NOT DETECTED   Final   Influenza A H3 DETECTED (*)  Final   Comment: (NOTE)           Normal Reference Range for each Analyte: NOT DETECTED     Testing performed using the Luminex xTAG Respiratory Viral Panel test     kit.     This test was developed and its performance characteristics determined     by [facility]. It has not been cleared or approved by the [place]     Food and Drug Administration. This test is used for clinical purposes.     It should not be regarded as investigational or for research. This     laboratory is certified under the [place] (CLIA) as qualified to perform high complexity     clinical laboratory testing.     Performed at [facility]    Radiology Reports    Ct Chest W Contrast  11/30/2013   CLINICAL DATA:  Intubated. Abdominal pain, flu. Respiratory failure.  EXAM: CT CHEST WITH CONTRAST  TECHNIQUE: Multidetector CT imaging of the chest was performed during intravenous contrast administration.  CONTRAST:  161mL OMNIPAQUE IOHEXOL 300 MG/ML  SOLN  COMPARISON:  Chest x-ray earlier today.  FINDINGS: Small bilateral pleural effusions, right greater than left. Bibasilar atelectasis noted. Areas of consolidation noted in the right middle lobe and lingula. Nodules are noted in both lower lobes. 7 mm nodule in the right lower lobe on image  32. 10 mm nodule in the left lower lobe on image 24. 15 mm nodule in the right lower lobe on image 36 medially. It is unclear if these new nodules could be related to an infectious process or metastasis. Recommend followup after treatment and resolution  of acute symptoms.  Endotracheal tube is in the lower trachea. Small scattered mediastinal lymph nodes, none pathologically enlarged. No axillary or hilar adenopathy. Mild cardiomegaly. Scattered coronary artery calcifications.  No acute bony abnormality.  IMPRESSION: Bilateral lower lobe, right middle lobe and lingular areas of consolidation, most likely pneumonia.  Nodular densities/opacities in both lower lobes. This may be related to an infectious process/pneumonia, but recommend followup after resolution of acute symptoms and treatment as metastatic disease cannot be excluded.  Small bilateral pleural effusions, right greater than left.  Cardiomegaly.   Electronically Signed   By: Rolm Baptise M.D.   On: 11/30/2013 16:16   Ct Abdomen Pelvis W Contrast  11/30/2013   CLINICAL DATA:  Abdominal pain.  Intubated.  Flu.  EXAM: CT ABDOMEN AND PELVIS WITH CONTRAST  TECHNIQUE: Multidetector CT imaging of the abdomen and pelvis was performed using the standard protocol following bolus administration of intravenous contrast.  CONTRAST:  155mL OMNIPAQUE IOHEXOL 300 MG/ML  SOLN  COMPARISON:  07/05/2010  FINDINGS: Small bilateral pleural effusions, right slightly greater than left. Airspace disease noted in the lower lobes bilaterally, lingula and right middle lobe. Mild cardiomegaly.  Mild diffuse fatty infiltration of the liver. No focal abnormality. Spleen, pancreas, adrenals are unremarkable. Small cysts in the kidneys bilaterally. No hydronephrosis. Areas of cortical thinning and scarring bilaterally. Small nonobstructing stone in the lower pole of the left kidney.  NG tube is in place with the tip in the duodenal bulb. Stomach is decompressed, grossly unremarkable.  No evidence of bowel obstruction. Sigmoid diverticulosis without active diverticulitis. Foley catheter is in place in the bladder. Calcification noted posterior to the left side of the bladder. This is likely within the left ureter at the left UVJ, measuring 6 mm. No associated hydronephrosis.  Aorta and iliac vessels are heavily calcified, non aneurysmal. No free fluid, free air or adenopathy.  No acute bony abnormality. Degenerative changes in the thoracolumbar spine.  IMPRESSION: Mild fatty infiltration of the liver.  NG tube tip in the duodenum.  Sigmoid diverticulosis.  Apparent 6 mm distal left ureteral stone at the UVJ without obstruction/hydronephrosis. Small nonobstructing stone in the lower pole of the left kidney.  Bilateral pleural effusions. Lingular, right middle lobe and bilateral lower lobe airspace opacities.   Electronically Signed   By: Rolm Baptise M.D.   On: 11/30/2013 15:59   Dg Chest Port 1 View  12/04/2013   CLINICAL DATA:  Respiratory failure.  EXAM: PORTABLE CHEST - 1 VIEW  COMPARISON:  Earlier the same date and 12/03/2013.  FINDINGS: 0746 hr. The endotracheal tube, central line and nasogastric tube appear unchanged. The heart size and mediastinal contours are stable. There is slight worsening of pulmonary edema. Bibasilar atelectasis appears stable. There is no evidence of pneumothorax. Chronic deformity of the medial right clavicle is noted.  IMPRESSION: Slight worsening of pulmonary edema.  Stable support system.   Electronically Signed   By: Camie Patience M.D.   On: 12/04/2013 08:10         CBC  Recent Labs Lab 11/14/2013 1233 11/30/13 0236 12/01/13 0440 12/02/13 0421 12/03/13 0513 12/03/13 1028  WBC 8.6 7.3 6.0 5.2 7.2 9.5  HGB 13.1 12.8 10.0* 8.7* 8.9* 9.7*  HCT 38.8 37.6 29.7* 26.4* 26.6* 29.1*  PLT 223 200 200 216 279 315  MCV 86.2 85.6 86.6 87.1 85.8 85.8  MCH 29.1 29.2 29.2 28.7 28.7 28.6  MCHC 33.8 34.0 33.7 33.0 33.5 33.3  RDW 13.6 13.6 14.2 14.4 14.1  14.0   LYMPHSABS 0.5* 0.9 0.3* 0.3*  --  0.5*  MONOABS 0.6 0.6 0.4 0.5  --  1.1*  EOSABS 0.0 0.0 0.0 0.0  --  0.1  BASOSABS 0.0 0.0 0.0 0.0  --  0.0    Chemistries   Recent Labs Lab 11/19/2013 1233 11/30/13 0236 12/01/13 0440 12/01/13 1259 12/02/13 0421 12/03/13 0512 12/03/13 0513 12/03/13 1720 12/04/13 0450  NA 132* 135* 139 137 140  --  141 141 143  K 3.9 3.5* 3.7 3.7 3.3*  --  2.6* 3.1* 2.8*  CL 93* 95* 101 103 107  --  105 102 101  CO2 $Re'27 24 24 25 23  'hHD$ --  $R'26 23 30  'YK$ GLUCOSE 170* 161* 123* 122* 109*  --  104* 205* 296*  BUN 15 11 24* 22 22  --  20 21 24*  CREATININE 0.79 0.70 0.91 0.76 0.77  --  0.77 0.72 0.74  CALCIUM 8.9 8.8 8.3* 8.3* 8.3*  --  8.6 8.6 8.9  MG  --   --   --   --   --  1.7  --   --   --   AST $Re'22 21 16 14 10  'LGe$ --   --   --   --   ALT $Re'20 16 11 11 9  'ceO$ --   --   --   --   ALKPHOS 81 58 55 52 49  --   --   --   --   BILITOT 0.4 0.4 0.5 0.5 0.6  --   --   --   --    ------------------------------------------------------------------------------------------------------------------ estimated creatinine clearance is 46 ml/min (by C-G formula based on Cr of 0.74). ------------------------------------------------------------------------------------------------------------------ No results found for this basename: HGBA1C,  in the last 72 hours ------------------------------------------------------------------------------------------------------------------ No results found for this basename: CHOL, HDL, LDLCALC, TRIG, CHOLHDL, LDLDIRECT,  in the last 72 hours ------------------------------------------------------------------------------------------------------------------ No results found for this basename: TSH, T4TOTAL, FREET3, T3FREE, THYROIDAB,  in the last 72 hours ------------------------------------------------------------------------------------------------------------------ No results found for this basename: VITAMINB12, FOLATE, FERRITIN, TIBC, IRON, RETICCTPCT,  in the  last 72 hours  Coagulation profile  Recent Labs Lab 12/02/13 0421  INR 1.05    No results found for this basename: DDIMER,  in the last 72 hours  Cardiac Enzymes  Recent Labs Lab 11/30/2013 1433 11/03/2013 2013 11/30/13 0236  TROPONINI <0.30 <0.30 <0.30   ------------------------------------------------------------------------------------------------------------------ No components found with this basename: POCBNP,      Time Spent in minutes   35   Natina Wiginton K M.D on 12/04/2013 at 10:57 AM  Between 7am to 7pm - Pager - [phone]  After 7pm go to [email or web address] - password [email or web address]  And look for the night coverage person covering for me after hours  [facility] Office  [phone]

## 2013-12-04 NOTE — Progress Notes (Signed)
Subjective: She remains intubated and on the ventilator. She has no new complaints. Her blood pressure has come down somewhat.  Objective: Vital signs in last 24 hours: Temp:  [97.4 F (36.3 C)-99.5 F (37.5 C)] 97.4 F (36.3 C) (01/04 0400) Pulse Rate:  [65-103] 103 (01/04 0630) Resp:  [12-22] 15 (01/04 0630) BP: (91-138)/(46-86) 120/86 mmHg (01/04 0630) SpO2:  [93 %-100 %] 99 % (01/04 0823) Arterial Line BP: (91-154)/(40-78) 154/78 mmHg (01/04 0630) FiO2 (%):  [35 %-40 %] 35 % (01/04 0823) Weight:  [64.1 kg (141 lb 5 oz)] 64.1 kg (141 lb 5 oz) (01/04 0630) Weight change: 0.8 kg (1 lb 12.2 oz) Last BM Date: 12/04/13  Intake/Output from previous day: 01/03 0701 - 01/04 0700 In: 1635.2 [I.V.:319.9; NG/GT:765.3; IV Piggyback:550] Out: 2950 [Urine:2950]  PHYSICAL EXAM General appearance: Intubated and sedated Resp: rhonchi bilaterally Cardio: regular rate and rhythm, S1, S2 normal, no murmur, click, rub or gallop GI: soft, non-tender; bowel sounds normal; no masses,  no organomegaly Extremities: extremities normal, atraumatic, no cyanosis or edema  Lab Results:    Basic Metabolic Panel:  Recent Labs  12/02/13 0421 12/03/13 0512  12/03/13 1720 12/04/13 0450  NA 140  --   < > 141 143  K 3.3*  --   < > 3.1* 2.8*  CL 107  --   < > 102 101  CO2 23  --   < > 23 30  GLUCOSE 109*  --   < > 205* 296*  BUN 22  --   < > 21 24*  CREATININE 0.77  --   < > 0.72 0.74  CALCIUM 8.3*  --   < > 8.6 8.9  MG  --  1.7  --   --   --   PHOS  --   --   --   --  2.8  < > = values in this interval not displayed. Liver Function Tests:  Recent Labs  12/01/13 1259 12/02/13 0421 12/04/13 0450  AST 14 10  --   ALT 11 9  --   ALKPHOS 52 49  --   BILITOT 0.5 0.6  --   PROT 5.4* 5.0*  --   ALBUMIN 2.3* 1.9* 2.1*   No results found for this basename: LIPASE, AMYLASE,  in the last 72 hours No results found for this basename: AMMONIA,  in the last 72 hours CBC:  Recent Labs   12/02/13 0421 12/03/13 0513 12/03/13 1028  WBC 5.2 7.2 9.5  NEUTROABS 4.4  --  7.9*  HGB 8.7* 8.9* 9.7*  HCT 26.4* 26.6* 29.1*  MCV 87.1 85.8 85.8  PLT 216 279 315   Cardiac Enzymes: No results found for this basename: CKTOTAL, CKMB, CKMBINDEX, TROPONINI,  in the last 72 hours BNP: No results found for this basename: PROBNP,  in the last 72 hours D-Dimer: No results found for this basename: DDIMER,  in the last 72 hours CBG:  Recent Labs  12/03/13 1204 12/03/13 1647 12/03/13 1940 12/04/13 0020 12/04/13 0443 12/04/13 0739  GLUCAP 102* 192* 215* 236* 259* 281*   Hemoglobin A1C: No results found for this basename: HGBA1C,  in the last 72 hours Fasting Lipid Panel: No results found for this basename: CHOL, HDL, LDLCALC, TRIG, CHOLHDL, LDLDIRECT,  in the last 72 hours Thyroid Function Tests: No results found for this basename: TSH, T4TOTAL, FREET4, T3FREE, THYROIDAB,  in the last 72 hours Anemia Panel: No results found for this basename: VITAMINB12, FOLATE, FERRITIN, TIBC, IRON,  RETICCTPCT,  in the last 72 hours Coagulation:  Recent Labs  12/02/13 0421  LABPROT 13.5  INR 1.05   Urine Drug Screen: Drugs of Abuse  No results found for this basename: labopia, cocainscrnur, labbenz, amphetmu, thcu, labbarb    Alcohol Level: No results found for this basename: ETH,  in the last 72 hours Urinalysis: No results found for this basename: COLORURINE, APPERANCEUR, LABSPEC, PHURINE, GLUCOSEU, HGBUR, BILIRUBINUR, KETONESUR, PROTEINUR, UROBILINOGEN, NITRITE, LEUKOCYTESUR,  in the last 72 hours Misc. Labs:  ABGS  Recent Labs  12/03/13 0410  PHART 7.408  PO2ART 110.0*  TCO2 14.3  HCO3 15.1*   CULTURES Recent Results (from the past 240 hour(s))  CULTURE, BLOOD (ROUTINE X 2)     Status: None   Collection Time    11/14/2013  2:34 PM      Result Value Range Status   Specimen Description BLOOD RIGHT ANTECUBITAL   Final   Special Requests     Final   Value: BOTTLES DRAWN  AEROBIC AND ANAEROBIC AEB=12CC ANA=10CC   Culture NO GROWTH 5 DAYS   Final   Report Status 12/04/2013 FINAL   Final  CULTURE, BLOOD (ROUTINE X 2)     Status: None   Collection Time    11/07/2013  2:40 PM      Result Value Range Status   Specimen Description BLOOD RIGHT HAND   Final   Special Requests BOTTLES DRAWN AEROBIC AND ANAEROBIC 5CC   Final   Culture NO GROWTH 5 DAYS   Final   Report Status 12/04/2013 FINAL   Final  MRSA PCR SCREENING     Status: Abnormal   Collection Time    11/25/2013  4:04 PM      Result Value Range Status   MRSA by PCR POSITIVE (*) NEGATIVE Final   Comment:            The GeneXpert MRSA Assay (FDA     approved for NASAL specimens     only), is one component of a     comprehensive MRSA colonization     surveillance program. It is not     intended to diagnose MRSA     infection nor to guide or     monitor treatment for     MRSA infections.     RESULT CALLED TO, READ BACK BY AND VERIFIED WITH:     COLLINS,B. AT 1906 ON 11/30/2013 BY BAUGHAM,M.  CULTURE, RESPIRATORY (NON-EXPECTORATED)     Status: None   Collection Time    11/30/13  3:59 PM      Result Value Range Status   Specimen Description TRACHEAL ASPIRATE   Final   Special Requests BLOODY SPECIMEN   Final   Gram Stain     Final   Value: ABUNDANT WBC PRESENT, PREDOMINANTLY PMN     RARE SQUAMOUS EPITHELIAL CELLS PRESENT     ABUNDANT GRAM POSITIVE COCCI IN PAIRS     IN CLUSTERS IN CHAINS     Performed at [facility]   Culture     Final   Value: ABUNDANT METHICILLIN RESISTANT STAPHYLOCOCCUS AUREUS     Note: RIFAMPIN AND GENTAMICIN SHOULD NOT BE USED AS SINGLE DRUGS FOR TREATMENT OF STAPH INFECTIONS. This organism is presumed to be Clindamycin resistant based on detection of inducible Clindamycin resistance. CRITICAL RESULT CALLED TO, READ BACK BY AND      VERIFIED WITH: Bertie Mcconathy RN 135PM 12/03/13 GUSTK     Performed at [facility]  Report Status 12/03/2013 FINAL   Final   Organism ID,  Bacteria METHICILLIN RESISTANT STAPHYLOCOCCUS AUREUS   Final  RESPIRATORY VIRUS PANEL     Status: Abnormal   Collection Time    11/30/13  6:00 PM      Result Value Range Status   Source - RVPAN NASOPHARYNGEAL SWAB   Corrected   Comment: CORRECTED ON 01/02 AT 2306: PREVIOUSLY REPORTED AS NASOPHARYNGEAL SWAB   Respiratory Syncytial Virus A NOT DETECTED   Final   Respiratory Syncytial Virus B NOT DETECTED   Final   Influenza A DETECTED (*)  Final   Influenza B NOT DETECTED   Final   Parainfluenza 1 NOT DETECTED   Final   Parainfluenza 2 NOT DETECTED   Final   Parainfluenza 3 NOT DETECTED   Final   Metapneumovirus NOT DETECTED   Final   Rhinovirus NOT DETECTED   Final   Adenovirus NOT DETECTED   Final   Influenza A H1 NOT DETECTED   Final   Influenza A H3 DETECTED (*)  Final   Comment: (NOTE)           Normal Reference Range for each Analyte: NOT DETECTED     Testing performed using the Luminex xTAG Respiratory Viral Panel test     kit.     This test was developed and its performance characteristics determined     by [facility]. It has not been cleared or approved by the [place]     Food and Drug Administration. This test is used for clinical purposes.     It should not be regarded as investigational or for research. This     laboratory is certified under the [place] (CLIA) as qualified to perform high complexity     clinical laboratory testing.     Performed at [facility]   Studies/Results: Dg Chest 1 View  12/04/2013   CLINICAL DATA:  Follow-up chest x-ray.  Pneumonia.  EXAM: CHEST - 1 VIEW  COMPARISON:  12/03/2013 and 12/02/2013.  FINDINGS: 0640 hr. The endotracheal tube, central line and nasogastric tube appear unchanged. Cardiomegaly, pulmonary edema and bibasilar airspace opacities are unchanged. A small amount of pleural fluid bilaterally is likely. There is no pneumothorax.  IMPRESSION: Unchanged position of support  system. Grossly stable bilateral pulmonary opacities and probable small pleural effusions.   Electronically Signed   By: Camie Patience M.D.   On: 12/04/2013 08:08   Dg Chest Port 1 View  12/04/2013   CLINICAL DATA:  Respiratory failure.  EXAM: PORTABLE CHEST - 1 VIEW  COMPARISON:  Earlier the same date and 12/03/2013.  FINDINGS: 0746 hr. The endotracheal tube, central line and nasogastric tube appear unchanged. The heart size and mediastinal contours are stable. There is slight worsening of pulmonary edema. Bibasilar atelectasis appears stable. There is no evidence of pneumothorax. Chronic deformity of the medial right clavicle is noted.  IMPRESSION: Slight worsening of pulmonary edema.  Stable support system.   Electronically Signed   By: Camie Patience M.D.   On: 12/04/2013 08:10   Dg Chest Port 1 View  12/03/2013   CLINICAL DATA:  Followup chest x-ray  EXAM: PORTABLE CHEST - 1 VIEW  COMPARISON:  Prior chest x-ray 12/02/2010 1,015  FINDINGS: The endotracheal tube is 1.4 cm above the carinal. Left subclavian approach central venous catheter with the tip in the mid SVC. Nasogastric tube tip lies below the field of view, presumably  within the stomach. No significant grafts that no significant interval change in the appearance of the lungs with pulmonary vascular congestion, interstitial prominence and bibasilar opacities. Probable moderate layering right pleural effusion. Some evidence of volume loss in the left base with peaking of the hemidiaphragm.  IMPRESSION: Overall, no significant interval change in the appearance of the chest over the last 24 hr.  Persistent background of pulmonary vascular congestion and mild interstitial edema with small to moderate layering effusion on the right and bibasilar opacities which could reflect a combination of atelectasis and edema versus infiltrate.  Stable support apparatus.   Electronically Signed   By: Jacqulynn Cadet M.D.   On: 12/03/2013 08:06    Medications:   Prior to Admission:  Prescriptions prior to admission  Medication Sig Dispense Refill  . ALPRAZolam (XANAX) 0.5 MG tablet Take 0.5-1 mg by mouth 2 (two) times daily. One tablet in the morning and 2 at bedtime      . amLODipine (NORVASC) 2.5 MG tablet Take 2.5 mg by mouth daily.      Marland Kitchen aspirin EC 81 MG tablet Take 81 mg by mouth daily.      . Aspirin-Acetaminophen-Caffeine (GOODY HEADACHE PO) Take 1 packet by mouth daily as needed (headaches).      . benzonatate (TESSALON) 100 MG capsule Take 100 mg by mouth 3 (three) times daily as needed for cough.      . docusate sodium (COLACE) 100 MG capsule Take 100 mg by mouth 2 (two) times daily.       . DULoxetine (CYMBALTA) 30 MG capsule Take 30 mg by mouth daily.      Marland Kitchen HYDROcodone-acetaminophen (NORCO) 10-325 MG per tablet Take 1-2 tablets by mouth every 6 (six) hours as needed for moderate pain.      . hydrocortisone (CORTEF) 20 MG tablet Take 10 mg by mouth daily.       . Loteprednol Etabonate (LOTEMAX) 0.5 % GEL Apply 1 drop to eye 3 (three) times daily.      Marland Kitchen lubiprostone (AMITIZA) 24 MCG capsule Take 24 mcg by mouth daily with breakfast.       . Multiple Vitamin (DAILY VITE PO) Take 1 tablet by mouth daily.      . pravastatin (PRAVACHOL) 80 MG tablet Take 1 tablet (80 mg total) by mouth daily.  30 tablet  0  . psyllium (METAMUCIL) 58.6 % packet Take 1 packet by mouth daily.       Scheduled: . ALPRAZolam  0.5 mg Oral TID WC   And  . ALPRAZolam  1 mg Oral QHS  . antiseptic oral rinse  15 mL Mouth Rinse QID  . chlorhexidine  15 mL Mouth Rinse BID  . Chlorhexidine Gluconate Cloth  6 each Topical Q0600  . docusate sodium  100 mg Oral BID  . fentaNYL  100 mcg Intravenous Once  . fentaNYL  50 mcg Intravenous Once  . furosemide  40 mg Intravenous Once  . heparin  5,000 Units Subcutaneous Q8H  . insulin aspart  0-20 Units Subcutaneous TID WC  . insulin detemir  10 Units Subcutaneous QHS  . ipratropium-albuterol  3 mL Nebulization Q6H  .  methylPREDNISolone (SOLU-MEDROL) injection  60 mg Intravenous Q6H  . mupirocin ointment  1 application Nasal BID  . oseltamivir  75 mg Per Tube BID  . pantoprazole (PROTONIX) IV  40 mg Intravenous Daily  . piperacillin-tazobactam (ZOSYN)  IV  3.375 g Intravenous Q8H  . potassium chloride  10 mEq Intravenous  Q1 Hr x 6  . sodium chloride  500 mL Intravenous Once  . sodium chloride  10-40 mL Intracatheter Q12H  . vancomycin  750 mg Intravenous Q12H   Continuous: . feeding supplement (VITAL AF 1.2 CAL) 1,000 mL (12/04/13 0300)  . fentaNYL infusion INTRAVENOUS 150 mcg/hr (12/04/13 0600)   ZCK:ICHTVGVS, midazolam, morphine injection, sodium chloride  Assesment: She was admitted with healthcare associated pneumonia and severe sepsis with septic shock. Her chest x-ray could be related to multifocal pneumonia but could be related to CHF. She's been being treated with Lasix. She's had pretty good response to that her potassium is low now. Her problems are complicated by her dementia. She was unable to wean yesterday. Principal Problem:   Severe sepsis with septic shock Active Problems:   DCIS (ductal carcinoma in situ) of breast   Hypoadrenalism   Hypertension   Hyperlipidemia   Dementia   Metabolic encephalopathy   Acute on chronic kidney disease, stage 3   HCAP (healthcare-associated pneumonia)    Plan: I think we should get echocardiogram to get some idea of her left ventricular function. Her blood sugar has been up so I changed her to resistant sliding scale and added Levemir. She's getting potassium replacement and we could attempt to wean her after that is finished.    LOS: 5 days   Bonnie [name] L 12/04/2013, 10:02 AM

## 2013-12-04 NOTE — Progress Notes (Signed)
*  PRELIMINARY RESULTS* Echocardiogram 2D Echocardiogram has been performed.  Tera Partridge 12/04/2013, 11:52 AM

## 2013-12-04 NOTE — Progress Notes (Signed)
ANTIBIOTIC CONSULT NOTE - follow up  Pharmacy Consult for Vancomycin & Zosyn Indication: Pneumonia  Allergies  Allergen Reactions  . Macrodantin Nausea Only    It also causes fever   Patient Measurements: Height: _0  (157.5 cm) Weight: 141 lb 5 oz (64.1 kg) IBW/kg (Calculated) : 50.1  Vital Signs: Temp: 97.4 F (36.3 C) (01/04 0400) Temp src: Axillary (01/04 0400) BP: 120/86 mmHg (01/04 0630) Pulse Rate: 103 (01/04 0630) Intake/Output from previous day: 01/03 0701 - 01/04 0700 In: 1635.2 [I.V.:319.9; NG/GT:765.3; IV Piggyback:550] Out: 2950 [Urine:2950] Intake/Output from this shift:    Labs:  Recent Labs  12/02/13 0421 12/03/13 0513 12/03/13 1028 12/03/13 1720 12/04/13 0450  WBC 5.2 7.2 9.5  --   --   HGB 8.7* 8.9* 9.7*  --   --   PLT 216 279 315  --   --   CREATININE 0.77 0.77  --  0.72 0.74   Estimated Creatinine Clearance: 46 ml/min (by C-G formula based on Cr of 0.74).  Recent Labs  12/03/13 1452  [place] 5.1*    Microbiology: Recent Results (from the past 720 hour(s))  CULTURE, BLOOD (ROUTINE X 2)     Status: None   Collection Time    11/07/2013  2:34 PM      Result Value Range Status   Specimen Description BLOOD RIGHT ANTECUBITAL   Final   Special Requests     Final   Value: BOTTLES DRAWN AEROBIC AND ANAEROBIC AEB=12CC ANA=10CC   Culture NO GROWTH 4 DAYS   Final   Report Status PENDING   Incomplete  CULTURE, BLOOD (ROUTINE X 2)     Status: None   Collection Time    11/01/2013  2:40 PM      Result Value Range Status   Specimen Description BLOOD RIGHT HAND   Final   Special Requests BOTTLES DRAWN AEROBIC AND ANAEROBIC 5CC   Final   Culture NO GROWTH 4 DAYS   Final   Report Status PENDING   Incomplete  MRSA PCR SCREENING     Status: Abnormal   Collection Time    11/11/2013  4:04 PM      Result Value Range Status   MRSA by PCR POSITIVE (*) NEGATIVE Final   Comment:            The GeneXpert MRSA Assay (FDA     approved for NASAL specimens      only), is one component of a     comprehensive MRSA colonization     surveillance program. It is not     intended to diagnose MRSA     infection nor to guide or     monitor treatment for     MRSA infections.     RESULT CALLED TO, READ BACK BY AND VERIFIED WITH:     COLLINS,B. AT 1906 ON 11/28/2013 BY BAUGHAM,M.  CULTURE, RESPIRATORY (NON-EXPECTORATED)     Status: None   Collection Time    11/30/13  3:59 PM      Result Value Range Status   Specimen Description TRACHEAL ASPIRATE   Final   Special Requests BLOODY SPECIMEN   Final   Gram Stain     Final   Value: ABUNDANT WBC PRESENT, PREDOMINANTLY PMN     RARE SQUAMOUS EPITHELIAL CELLS PRESENT     ABUNDANT GRAM POSITIVE COCCI IN PAIRS     IN CLUSTERS IN CHAINS     Performed at [facility]  Final   Value: ABUNDANT METHICILLIN RESISTANT STAPHYLOCOCCUS AUREUS     Note: RIFAMPIN AND GENTAMICIN SHOULD NOT BE USED AS SINGLE DRUGS FOR TREATMENT OF STAPH INFECTIONS. This organism is presumed to be Clindamycin resistant based on detection of inducible Clindamycin resistance. CRITICAL RESULT CALLED TO, READ BACK BY AND      VERIFIED WITH: HAWKINS RN 135PM 12/03/13 GUSTK     Performed at [facility]   Report Status 12/03/2013 FINAL   Final   Organism ID, Bacteria METHICILLIN RESISTANT STAPHYLOCOCCUS AUREUS   Final  RESPIRATORY VIRUS PANEL     Status: Abnormal   Collection Time    11/30/13  6:00 PM      Result Value Range Status   Source - RVPAN NASOPHARYNGEAL SWAB   Corrected   Comment: CORRECTED ON 01/02 AT 2306: PREVIOUSLY REPORTED AS NASOPHARYNGEAL SWAB   Respiratory Syncytial Virus A NOT DETECTED   Final   Respiratory Syncytial Virus B NOT DETECTED   Final   Influenza A DETECTED (*)  Final   Influenza B NOT DETECTED   Final   Parainfluenza 1 NOT DETECTED   Final   Parainfluenza 2 NOT DETECTED   Final   Parainfluenza 3 NOT DETECTED   Final   Metapneumovirus NOT DETECTED   Final   Rhinovirus NOT DETECTED    Final   Adenovirus NOT DETECTED   Final   Influenza A H1 NOT DETECTED   Final   Influenza A H3 DETECTED (*)  Final   Comment: (NOTE)           Normal Reference Range for each Analyte: NOT DETECTED     Testing performed using the Luminex xTAG Respiratory Viral Panel test     kit.     This test was developed and its performance characteristics determined     by [facility]. It has not been cleared or approved by the [place]     Food and Drug Administration. This test is used for clinical purposes.     It should not be regarded as investigational or for research. This     laboratory is certified under the [place] (CLIA) as qualified to perform high complexity     clinical laboratory testing.     Performed at [facility] History: Past Medical History  Diagnosis Date  . GERD (gastroesophageal reflux disease)   . Colitis, ischemic   . Osteoarthritis   . Breast cancer     Right Breast - May 2013/ Left Breast - 2003  . Anxiety   . Stress incontinence, female   . Constipation - functional   . Hypertension   . Headache(784.0)   . Hx: UTI (urinary tract infection)   . HOH (hard of hearing)     both ears; refuses to wear hearing aides  . Dementia   . Benign essential tremor     left hand; intermittent  . Hyperlipidemia   . Adrenal gland hypofunction     takes cortisone daily  . S/P radiation therapy 04/15/12 -05/13/12    RIGHT BREAST  . Chronic constipation 01/04/13  . Fatigue 02/25/13    chronic  . Collar bone fracture   . Depression   . Glucocorticoid deficiency    Medications:  Scheduled:  . ALPRAZolam  0.5 mg Oral TID WC   And  . ALPRAZolam  1 mg Oral QHS  . antiseptic oral rinse  15 mL Mouth Rinse  QID  . chlorhexidine  15 mL Mouth Rinse BID  . Chlorhexidine Gluconate Cloth  6 each Topical Q0600  . docusate sodium  100 mg Oral BID  . fentaNYL  100 mcg Intravenous Once  . fentaNYL  50 mcg Intravenous  Once  . heparin  5,000 Units Subcutaneous Q8H  . insulin aspart  0-15 Units Subcutaneous TID WC  . ipratropium-albuterol  3 mL Nebulization Q6H  . magnesium sulfate  1 g Intravenous Once  . methylPREDNISolone (SOLU-MEDROL) injection  60 mg Intravenous Q6H  . mupirocin ointment  1 application Nasal BID  . oseltamivir  75 mg Per Tube BID  . pantoprazole (PROTONIX) IV  40 mg Intravenous Daily  . piperacillin-tazobactam (ZOSYN)  IV  3.375 g Intravenous Q8H  . potassium chloride  10 mEq Intravenous Q1 Hr x 6  . sodium chloride  500 mL Intravenous Once  . sodium chloride  10-40 mL Intracatheter Q12H  . vancomycin  750 mg Intravenous Q12H   Assessment: 80 yo F admitted from NH with flu-like symptoms.  CXR + PNA.  Broad-spectrum antibiotics to cover for HCAP.   Renal function is stable. Estimated Creatinine Clearance: 46 ml/min (by C-G formula based on Cr of 0.74).  Trough level below goal.  Organism ID, Bacteria METHICILLIN RESISTANT STAPHYLOCOCCUS AUREUS      Culture & Susceptibility    METHICILLIN RESISTANT STAPHYLOCOCCUS AUREUS    Antibiotic Sensitivity Microscan Status    CLINDAMYCIN   RESISTANT Final    Method: MIC    ERYTHROMYCIN Resistant >=8 RESISTANT Final    Method: MIC    GENTAMICIN Sensitive <=0.5 SENSITIVE Final    Method: MIC    LEVOFLOXACIN Intermediate 4 INTERMEDIATE Final    Method: MIC    OXACILLIN Resistant >=4 RESISTANT Final    Method: MIC    PENICILLIN Resistant >=0.5 RESISTANT Final    Method: MIC    RIFAMPIN Sensitive <=0.5 SENSITIVE Final    Method: MIC    TETRACYCLINE Sensitive <=1 SENSITIVE Final    Method: MIC    TRIMETH/SULFA Sensitive <=10 SENSITIVE Final    Method: MIC    VANCOMYCIN Sensitive 1 SENSITIVE Final    Method: MIC    Comments METHICILLIN RESISTANT STAPHYLOCOCCUS AUREUS (MIC)    ABUNDANT METHICILLIN RESISTANT STAPHYLOCOCCUS AUREUS    Cefepime 12/30>> 1/1 Vancomycin 12/30>> Zosyn 1/1 >>  Goal of Therapy:  Vancomycin trough level  15-20 mcg/ml  Plan:  Zosyn 3.375gm IV q8h, each dose over 4 hrs Increase Vancomycin to 770m IV q12h Re-check Vancomycin trough at steady state & weekly Monitor renal function and cx data   HHart RobinsonsA 12/04/2013,7:56 AM

## 2013-12-05 ENCOUNTER — Inpatient Hospital Stay (HOSPITAL_COMMUNITY): Payer: Medicare Other

## 2013-12-05 LAB — GLUCOSE, CAPILLARY
GLUCOSE-CAPILLARY: 154 mg/dL — AB (ref 70–99)
GLUCOSE-CAPILLARY: 159 mg/dL — AB (ref 70–99)
GLUCOSE-CAPILLARY: 233 mg/dL — AB (ref 70–99)
Glucose-Capillary: 200 mg/dL — ABNORMAL HIGH (ref 70–99)
Glucose-Capillary: 198 mg/dL — ABNORMAL HIGH (ref 70–99)
Glucose-Capillary: 166 mg/dL — ABNORMAL HIGH (ref 70–99)

## 2013-12-05 LAB — BASIC METABOLIC PANEL
BUN: 33 mg/dL — ABNORMAL HIGH (ref 6–23)
CALCIUM: 8.6 mg/dL (ref 8.4–10.5)
CO2: 34 meq/L — AB (ref 19–32)
CREATININE: 0.66 mg/dL (ref 0.50–1.10)
Chloride: 103 mEq/L (ref 96–112)
GFR calc Af Amer: >90 mL/min (ref >90)
GFR calc non Af Amer: 79 mL/min — ABNORMAL LOW (ref >90)
GLUCOSE: 220 mg/dL — AB (ref 70–99)
Potassium: 2.9 mEq/L — CL (ref 3.7–5.3)
Sodium: 147 mEq/L (ref 137–147)

## 2013-12-05 LAB — BLOOD GAS, ARTERIAL
ACID-BASE EXCESS: 9.5 mmol/L — AB (ref 0.0–2.0)
Bicarbonate: 34 mEq/L — ABNORMAL HIGH (ref 20.0–24.0)
DRAWN BY: 21310
FIO2: 0.4 %
O2 Saturation: 97.3 %
PEEP: 5 cmH2O
PH ART: 7.442 (ref 7.350–7.450)
Patient temperature: 37
RATE: 12 resp/min
TCO2: 32.5 mmol/L (ref 0–100)
VT: 450 mL
pCO2 arterial: 50.6 mmHg — ABNORMAL HIGH (ref 35.0–45.0)
pO2, Arterial: 87.9 mmHg (ref 80.0–100.0)

## 2013-12-05 LAB — CBC
HEMATOCRIT: 26.8 % — AB (ref 36.0–46.0)
HEMOGLOBIN: 9.1 g/dL — AB (ref 12.0–15.0)
MCH: 29.2 pg (ref 26.0–34.0)
MCHC: 34 g/dL (ref 30.0–36.0)
MCV: 85.9 fL (ref 78.0–100.0)
Platelets: 371 10*3/uL (ref 150–400)
RBC: 3.12 MIL/uL — ABNORMAL LOW (ref 3.87–5.11)
RDW: 14.1 % (ref 11.5–15.5)
WBC: 11 10*3/uL — AB (ref 4.0–10.5)

## 2013-12-05 LAB — MAGNESIUM: Magnesium: 2 mg/dL (ref 1.5–2.5)

## 2013-12-05 MED ORDER — VITAL AF 1.2 CAL PO LIQD
1000.0000 mL | ORAL | Status: DC
  Administered 2013-12-05 – 2013-12-06 (×2): 1000 mL
  Filled 2013-12-05 (×4): qty 1000

## 2013-12-05 MED ORDER — HALOPERIDOL LACTATE 5 MG/ML IJ SOLN
2.0000 mg | Freq: Four times a day (QID) | INTRAMUSCULAR | Status: DC | PRN
  Administered 2013-12-05 – 2013-12-09 (×6): 2 mg via INTRAVENOUS
  Filled 2013-12-05 (×7): qty 1

## 2013-12-05 MED ORDER — MIDAZOLAM HCL 50 MG/10ML IJ SOLN
INTRAMUSCULAR | Status: AC
  Filled 2013-12-05: qty 1

## 2013-12-05 MED ORDER — POTASSIUM CHLORIDE 10 MEQ/100ML IV SOLN
10.0000 meq | INTRAVENOUS | Status: AC
  Administered 2013-12-05 (×6): 10 meq via INTRAVENOUS
  Filled 2013-12-05 (×7): qty 100

## 2013-12-05 MED ORDER — METHYLPREDNISOLONE SODIUM SUCC 40 MG IJ SOLR
40.0000 mg | Freq: Two times a day (BID) | INTRAMUSCULAR | Status: DC
  Administered 2013-12-05 – 2013-12-09 (×8): 40 mg via INTRAVENOUS
  Filled 2013-12-05 (×8): qty 1

## 2013-12-05 MED ORDER — PRO-STAT SUGAR FREE PO LIQD
30.0000 mL | Freq: Two times a day (BID) | ORAL | Status: DC
  Administered 2013-12-05 – 2013-12-07 (×5): 30 mL via ORAL
  Filled 2013-12-05 (×5): qty 30

## 2013-12-05 MED ORDER — POTASSIUM CHLORIDE 20 MEQ/15ML (10%) PO LIQD
40.0000 meq | Freq: Two times a day (BID) | ORAL | Status: DC
  Administered 2013-12-05 – 2013-12-07 (×5): 40 meq via ORAL
  Filled 2013-12-05 (×5): qty 30

## 2013-12-05 MED ORDER — QUETIAPINE FUMARATE 25 MG PO TABS
25.0000 mg | ORAL_TABLET | Freq: Two times a day (BID) | ORAL | Status: DC
  Administered 2013-12-05 – 2013-12-07 (×5): 25 mg via ORAL
  Filled 2013-12-05 (×10): qty 1

## 2013-12-05 MED ORDER — LOPERAMIDE HCL 2 MG PO CAPS: 2.0000 mg | ORAL_CAPSULE | ORAL | Status: DC | PRN

## 2013-12-05 MED ORDER — FUROSEMIDE 10 MG/ML IJ SOLN
40.0000 mg | Freq: Once | INTRAMUSCULAR | Status: AC
  Administered 2013-12-05: 40 mg via INTRAVENOUS
  Filled 2013-12-05: qty 4

## 2013-12-05 MED ORDER — FENTANYL CITRATE 0.05 MG/ML IJ SOLN
INTRAMUSCULAR | Status: AC
  Filled 2013-12-05: qty 50

## 2013-12-05 NOTE — Progress Notes (Signed)
ANTIBIOTIC CONSULT NOTE - follow up  Pharmacy Consult for Vancomycin & Zosyn Indication: Pneumonia  Allergies  Allergen Reactions  . Macrodantin Nausea Only    It also causes fever   Patient Measurements: Height: 5\' 2"  (157.5 cm) Weight:  (will have to zero out bed) IBW/kg (Calculated) : 50.1  Vital Signs: Temp: 97.9 F (36.6 C) (01/05 0830) Temp src: Axillary (01/05 0830) BP: 131/62 mmHg (01/05 0700) Pulse Rate: 95 (01/05 0700) Intake/Output from previous day: 01/04 0701 - 01/05 0700 In: 1882.1 [I.V.:255.1; NG/GT:727; IV Piggyback:900] Out: 3350 [Urine:3350] Intake/Output from this shift: Total I/O In: 83.4 [I.V.:58.4; NG/GT:25] Out: 1200 [Urine:1200]  Labs:  Recent Labs  12/03/13 0513 12/03/13 1028 12/03/13 1720 12/04/13 0450 12/05/13 0419  WBC 7.2 9.5  --   --  11.0*  HGB 8.9* 9.7*  --   --  9.1*  PLT 279 315  --   --  371  CREATININE 0.77  --  0.72 0.74 0.66   Estimated Creatinine Clearance: 46 ml/min (by C-G formula based on Cr of 0.66).  Recent Labs  12/03/13 1452  [place] 5.1*    Microbiology: Recent Results (from the past 720 hour(s))  CULTURE, BLOOD (ROUTINE X 2)     Status: None   Collection Time    11/17/2013  2:34 PM      Result Value Range Status   Specimen Description BLOOD RIGHT ANTECUBITAL   Final   Special Requests     Final   Value: BOTTLES DRAWN AEROBIC AND ANAEROBIC AEB=12CC ANA=10CC   Culture NO GROWTH 5 DAYS   Final   Report Status 12/04/2013 FINAL   Final  CULTURE, BLOOD (ROUTINE X 2)     Status: None   Collection Time    11/14/2013  2:40 PM      Result Value Range Status   Specimen Description BLOOD RIGHT HAND   Final   Special Requests BOTTLES DRAWN AEROBIC AND ANAEROBIC 5CC   Final   Culture NO GROWTH 5 DAYS   Final   Report Status 12/04/2013 FINAL   Final  MRSA PCR SCREENING     Status: Abnormal   Collection Time    11/03/2013  4:04 PM      Result Value Range Status   MRSA by PCR POSITIVE (*) NEGATIVE Final   Comment:             The GeneXpert MRSA Assay (FDA     approved for NASAL specimens     only), is one component of a     comprehensive MRSA colonization     surveillance program. It is not     intended to diagnose MRSA     infection nor to guide or     monitor treatment for     MRSA infections.     RESULT CALLED TO, READ BACK BY AND VERIFIED WITH:     COLLINS,B. AT 1906 ON 11/27/2013 BY BAUGHAM,M.  CULTURE, RESPIRATORY (NON-EXPECTORATED)     Status: None   Collection Time    11/30/13  3:59 PM      Result Value Range Status   Specimen Description TRACHEAL ASPIRATE   Final   Special Requests BLOODY SPECIMEN   Final   Gram Stain     Final   Value: ABUNDANT WBC PRESENT, PREDOMINANTLY PMN     RARE SQUAMOUS EPITHELIAL CELLS PRESENT     ABUNDANT GRAM POSITIVE COCCI IN PAIRS     IN CLUSTERS IN CHAINS     Performed  at [facility]     Final   Value: ABUNDANT METHICILLIN RESISTANT STAPHYLOCOCCUS AUREUS     Note: RIFAMPIN AND GENTAMICIN SHOULD NOT BE USED AS SINGLE DRUGS FOR TREATMENT OF STAPH INFECTIONS. This organism is presumed to be Clindamycin resistant based on detection of inducible Clindamycin resistance. CRITICAL RESULT CALLED TO, READ BACK BY AND      VERIFIED WITH: HAWKINS RN 135PM 12/03/13 GUSTK     Performed at [facility]   Report Status 12/03/2013 FINAL   Final   Organism ID, Bacteria METHICILLIN RESISTANT STAPHYLOCOCCUS AUREUS   Final  RESPIRATORY VIRUS PANEL     Status: Abnormal   Collection Time    11/30/13  6:00 PM      Result Value Range Status   Source - RVPAN NASOPHARYNGEAL SWAB   Corrected   Comment: CORRECTED ON 01/02 AT 2306: PREVIOUSLY REPORTED AS NASOPHARYNGEAL SWAB   Respiratory Syncytial Virus A NOT DETECTED   Final   Respiratory Syncytial Virus B NOT DETECTED   Final   Influenza A DETECTED (*)  Final   Influenza B NOT DETECTED   Final   Parainfluenza 1 NOT DETECTED   Final   Parainfluenza 2 NOT DETECTED   Final   Parainfluenza 3 NOT  DETECTED   Final   Metapneumovirus NOT DETECTED   Final   Rhinovirus NOT DETECTED   Final   Adenovirus NOT DETECTED   Final   Influenza A H1 NOT DETECTED   Final   Influenza A H3 DETECTED (*)  Final   Comment: (NOTE)           Normal Reference Range for each Analyte: NOT DETECTED     Testing performed using the Luminex xTAG Respiratory Viral Panel test     kit.     This test was developed and its performance characteristics determined     by [facility]. It has not been cleared or approved by the [place]     Food and Drug Administration. This test is used for clinical purposes.     It should not be regarded as investigational or for research. This     laboratory is certified under the [place] (CLIA) as qualified to perform high complexity     clinical laboratory testing.     Performed at [facility] History: Past Medical History  Diagnosis Date  . GERD (gastroesophageal reflux disease)   . Colitis, ischemic   . Osteoarthritis   . Breast cancer     Right Breast - May 2013/ Left Breast - 2003  . Anxiety   . Stress incontinence, female   . Constipation - functional   . Hypertension   . Headache(784.0)   . Hx: UTI (urinary tract infection)   . HOH (hard of hearing)     both ears; refuses to wear hearing aides  . Dementia   . Benign essential tremor     left hand; intermittent  . Hyperlipidemia   . Adrenal gland hypofunction     takes cortisone daily  . S/P radiation therapy 04/15/12 -05/13/12    RIGHT BREAST  . Chronic constipation 01/04/13  . Fatigue 02/25/13    chronic  . Collar bone fracture   . Depression   . Glucocorticoid deficiency    Medications:  Scheduled:  . ALPRAZolam  0.5 mg Oral TID WC   And  . ALPRAZolam  1 mg Oral  QHS  . antiseptic oral rinse  15 mL Mouth Rinse QID  . chlorhexidine  15 mL Mouth Rinse BID  . Chlorhexidine Gluconate Cloth  6 each Topical Q0600  . docusate sodium  100  mg Oral BID  . heparin  5,000 Units Subcutaneous Q8H  . insulin aspart  0-20 Units Subcutaneous Q4H  . insulin detemir  10 Units Subcutaneous QHS  . ipratropium-albuterol  3 mL Nebulization Q6H  . methylPREDNISolone (SOLU-MEDROL) injection  40 mg Intravenous Q12H  . pantoprazole (PROTONIX) IV  40 mg Intravenous Daily  . potassium chloride  10 mEq Intravenous Q1 Hr x 6  . potassium chloride  40 mEq Oral BID  . QUEtiapine  25 mg Oral BID  . sodium chloride  500 mL Intravenous Once  . sodium chloride  10-40 mL Intracatheter Q12H  . vancomycin  750 mg Intravenous Q12H   Assessment: 80 yo F admitted from NH with flu-like symptoms.  CXR + PNA.  Broad-spectrum antibiotics to cover for HCAP.   Renal function is stable.  Currently afebrile.   Estimated Creatinine Clearance: 46 ml/min (by C-G formula based on Cr of 0.66).  Trough level below goal.  Organism ID, Bacteria METHICILLIN RESISTANT STAPHYLOCOCCUS AUREUS      Culture & Susceptibility    METHICILLIN RESISTANT STAPHYLOCOCCUS AUREUS    Antibiotic Sensitivity Microscan Status    CLINDAMYCIN   RESISTANT Final    Method: MIC    ERYTHROMYCIN Resistant >=8 RESISTANT Final    Method: MIC    GENTAMICIN Sensitive <=0.5 SENSITIVE Final    Method: MIC    LEVOFLOXACIN Intermediate 4 INTERMEDIATE Final    Method: MIC    OXACILLIN Resistant >=4 RESISTANT Final    Method: MIC    PENICILLIN Resistant >=0.5 RESISTANT Final    Method: MIC    RIFAMPIN Sensitive <=0.5 SENSITIVE Final    Method: MIC    TETRACYCLINE Sensitive <=1 SENSITIVE Final    Method: MIC    TRIMETH/SULFA Sensitive <=10 SENSITIVE Final    Method: MIC    VANCOMYCIN Sensitive 1 SENSITIVE Final    Method: MIC    Comments METHICILLIN RESISTANT STAPHYLOCOCCUS AUREUS (MIC)    ABUNDANT METHICILLIN RESISTANT STAPHYLOCOCCUS AUREUS    Cefepime 12/30>> 1/1 Vancomycin 12/30>> Zosyn 1/1 >> 1/4  Goal of Therapy:  Vancomycin trough level 15-20 mcg/ml  Plan:  Increase Vancomycin to  $R'750mg'Pq$  IV q12h Re-check Vancomycin trough at steady state & weekly Monitor renal function and cx data   Hart Robinsons A 12/05/2013,11:13 AM

## 2013-12-05 NOTE — Progress Notes (Signed)
NUTRITION FOLLOW UP  Intervention:   Decrease Vital AF to 35 ml/hr, which provides 11008 kcals, 63 grams protein, and584 ml fluid daily.  Add 30 ml Prostat BID, which provides additional 200 kcals and 30 grams protein.  Total regimen provides 1208 kcals, 93 grams protein, and 681 ml fluid daily, meeting 100% estimated calorie needs and 116% of estimated protein needs.   Nutrition Dx:   Inadequate oral intake related to inability to eat as evidenced by mechanical ventilation requiring NPO status.  Goal:   Pt to meet >/= 90% of their estimated nutrition needs; met  Monitor:   Respiratory status, TF tolerance, weight changes, labs, changes in status  Assessment:   Pt remains intubated on vent. Pt has failed weaning attempts to to increased heart rate, increase blood pressure, and agitation. MD concerned about diarrhea due to increased osmolarity.  Pt has gained 2# (1.4%) x 2 days.  Energy needs adjusted. Pt currently receiving Vital AF to 45 ml/hr to better meet nutritional needs. TF regimen at goal rate provides 1296 kcals, 81 grams protein, and 876 ml fluid daily. This meets 107% of estimated calorie needs and 100% of estimated protein needs.  Patient is currently intubated on ventilator support.  MV: 9.8 L/min Temp (24hrs), Avg:97.8 F (36.6 C), Min:97.8 F (36.6 C), Max:97.9 F (36.6 C)  Propofol: n/a ml/hr  Height: Ht Readings from Last 1 Encounters:  12/04/13 $RemoveB'5\' 2"'mgZtEbbo$  (1.575 m)    Weight Status:   Wt Readings from Last 1 Encounters:  12/04/13 141 lb 5 oz (64.1 kg)    Re-estimated needs:  Kcal: 1212 kcals daily Protein: 80-96 grams daily Fluid: 1.2-1.3 L daily  Skin: WDL  Diet Order: NPO   Intake/Output Summary (Last 24 hours) at 12/05/13 1212 Last data filed at 12/05/13 1022  Gross per 24 hour  Intake 1688.42 ml  Output   4550 ml  Net -2861.58 ml    Last BM: 12/05/13   Labs:   Recent Labs Lab 12/02/13 0421 12/03/13 0512  12/03/13 1720 12/04/13 0450  12/05/13 0419  NA 140  --   < > 141 143 147  K 3.3*  --   < > 3.1* 2.8* 2.9*  CL 107  --   < > 102 101 103  CO2 23  --   < > 23 30 34*  BUN 22  --   < > 21 24* 33*  CREATININE 0.77  --   < > 0.72 0.74 0.66  CALCIUM 8.3*  --   < > 8.6 8.9 8.6  MG  --  1.7  --   --   --  2.0  PHOS  --   --   --   --  2.8  --   GLUCOSE 109*  --   < > 205* 296* 220*  < > = values in this interval not displayed.  CBG (last 3)   Recent Labs  12/05/13 0020 12/05/13 0353 12/05/13 0757  GLUCAP 233* 200* 198*    Scheduled Meds: . ALPRAZolam  0.5 mg Oral TID WC   And  . ALPRAZolam  1 mg Oral QHS  . antiseptic oral rinse  15 mL Mouth Rinse QID  . chlorhexidine  15 mL Mouth Rinse BID  . Chlorhexidine Gluconate Cloth  6 each Topical Q0600  . docusate sodium  100 mg Oral BID  . heparin  5,000 Units Subcutaneous Q8H  . insulin aspart  0-20 Units Subcutaneous Q4H  . insulin detemir  10 Units  Subcutaneous QHS  . ipratropium-albuterol  3 mL Nebulization Q6H  . methylPREDNISolone (SOLU-MEDROL) injection  40 mg Intravenous Q12H  . pantoprazole (PROTONIX) IV  40 mg Intravenous Daily  . potassium chloride  10 mEq Intravenous Q1 Hr x 6  . potassium chloride  40 mEq Oral BID  . QUEtiapine  25 mg Oral BID  . sodium chloride  500 mL Intravenous Once  . sodium chloride  10-40 mL Intracatheter Q12H  . vancomycin  750 mg Intravenous Q12H    Continuous Infusions: . feeding supplement (VITAL AF 1.2 CAL) 1,000 mL (12/04/13 1355)  . midazolam (VERSED) infusion 8 mg/hr (12/05/13 0920)    Margaree Sandhu A. Jimmye Norman, RD, LDN Pager: [phone]

## 2013-12-05 NOTE — Clinical Social Work Note (Signed)
CSW updated [facility] on pt. Pt remains intubated. Will keep [facility] updated on progress and determine level of care needs when appropriate. Facility willing to accept pt if able to meet needs.   Benay Pike, [place]

## 2013-12-05 NOTE — Progress Notes (Signed)
Subjective: She remains intubated and on mechanical ventilation. She had improvement in her respiratory status after receiving Lasix. This morning she is having a bit more congestion.  Objective: Vital signs in last 24 hours: Temp:  [97.8 F (36.6 C)] 97.8 F (36.6 C) (01/05 0400) Pulse Rate:  [63-113] 65 (01/05 0445) Resp:  [7-23] 12 (01/05 0445) BP: (90-157)/(43-91) 114/48 mmHg (01/05 0445) SpO2:  [84 %-100 %] 94 % (01/05 0445) Arterial Line BP: (105-160)/(51-79) 160/79 mmHg (01/04 1100) FiO2 (%):  [35 %-40 %] 40 % (01/05 0209) Weight change:  Last BM Date: 12/04/13  Intake/Output from previous day: 01/04 0701 - 01/05 0700 In: 1882.1 [I.V.:255.1; NG/GT:727; IV Piggyback:900] Out: 3350 [Urine:3350]  PHYSICAL EXAM General appearance: Intubated and sedated Resp: rhonchi bilaterally Cardio: regular rate and rhythm, S1, S2 normal, no murmur, click, rub or gallop GI: soft, non-tender; bowel sounds normal; no masses,  no organomegaly Extremities: extremities normal, atraumatic, no cyanosis or edema  Lab Results:    Basic Metabolic Panel:  Recent Labs  12/03/13 0512  12/03/13 1720 12/04/13 0450 12/05/13 0419  NA  --   < > 141 143 147  K  --   < > 3.1* 2.8* 2.9*  CL  --   < > 102 101 103  CO2  --   < > 23 30 34*  GLUCOSE  --   < > 205* 296* 220*  BUN  --   < > 21 24* 33*  CREATININE  --   < > 0.72 0.74 0.66  CALCIUM  --   < > 8.6 8.9 8.6  MG 1.7  --   --   --  2.0  PHOS  --   --   --  2.8  --   < > = values in this interval not displayed. Liver Function Tests:  Recent Labs  12/04/13 0450  ALBUMIN 2.1*   No results found for this basename: LIPASE, AMYLASE,  in the last 72 hours No results found for this basename: AMMONIA,  in the last 72 hours CBC:  Recent Labs  12/03/13 1028 12/05/13 0419  WBC 9.5 11.0*  NEUTROABS 7.9*  --   HGB 9.7* 9.1*  HCT 29.1* 26.8*  MCV 85.8 85.9  PLT 315 371   Cardiac Enzymes: No results found for this basename: CKTOTAL,  CKMB, CKMBINDEX, TROPONINI,  in the last 72 hours BNP:  Recent Labs  12/04/13 0450  PROBNP 4082.0*   D-Dimer: No results found for this basename: DDIMER,  in the last 72 hours CBG:  Recent Labs  12/04/13 0739 12/04/13 1156 12/04/13 1709 12/04/13 1956 12/05/13 0020 12/05/13 0353  GLUCAP 281* 270* 197* 208* 233* 200*   Hemoglobin A1C: No results found for this basename: HGBA1C,  in the last 72 hours Fasting Lipid Panel: No results found for this basename: CHOL, HDL, LDLCALC, TRIG, CHOLHDL, LDLDIRECT,  in the last 72 hours Thyroid Function Tests: No results found for this basename: TSH, T4TOTAL, FREET4, T3FREE, THYROIDAB,  in the last 72 hours Anemia Panel: No results found for this basename: VITAMINB12, FOLATE, FERRITIN, TIBC, IRON, RETICCTPCT,  in the last 72 hours Coagulation: No results found for this basename: LABPROT, INR,  in the last 72 hours Urine Drug Screen: Drugs of Abuse  No results found for this basename: labopia, cocainscrnur, labbenz, amphetmu, thcu, labbarb    Alcohol Level: No results found for this basename: ETH,  in the last 72 hours Urinalysis: No results found for this basename: COLORURINE, APPERANCEUR, [place], [place],  GLUCOSEU, HGBUR, BILIRUBINUR, KETONESUR, PROTEINUR, UROBILINOGEN, NITRITE, LEUKOCYTESUR,  in the last 72 hours Misc. Labs:  ABGS  Recent Labs  12/05/13 0602  PHART 7.442  PO2ART 87.9  TCO2 32.5  HCO3 34.0*   CULTURES Recent Results (from the past 240 hour(s))  CULTURE, BLOOD (ROUTINE X 2)     Status: None   Collection Time    11/21/2013  2:34 PM      Result Value Range Status   Specimen Description BLOOD RIGHT ANTECUBITAL   Final   Special Requests     Final   Value: BOTTLES DRAWN AEROBIC AND ANAEROBIC AEB=12CC ANA=10CC   Culture NO GROWTH 5 DAYS   Final   Report Status 12/04/2013 FINAL   Final  CULTURE, BLOOD (ROUTINE X 2)     Status: None   Collection Time    11/17/2013  2:40 PM      Result Value Range Status    Specimen Description BLOOD RIGHT HAND   Final   Special Requests BOTTLES DRAWN AEROBIC AND ANAEROBIC 5CC   Final   Culture NO GROWTH 5 DAYS   Final   Report Status 12/04/2013 FINAL   Final  MRSA PCR SCREENING     Status: Abnormal   Collection Time    11/22/2013  4:04 PM      Result Value Range Status   MRSA by PCR POSITIVE (*) NEGATIVE Final   Comment:            The GeneXpert MRSA Assay (FDA     approved for NASAL specimens     only), is one component of a     comprehensive MRSA colonization     surveillance program. It is not     intended to diagnose MRSA     infection nor to guide or     monitor treatment for     MRSA infections.     RESULT CALLED TO, READ BACK BY AND VERIFIED WITH:     COLLINS,B. AT 1906 ON 11/24/2013 BY BAUGHAM,M.  CULTURE, RESPIRATORY (NON-EXPECTORATED)     Status: None   Collection Time    11/30/13  3:59 PM      Result Value Range Status   Specimen Description TRACHEAL ASPIRATE   Final   Special Requests BLOODY SPECIMEN   Final   Gram Stain     Final   Value: ABUNDANT WBC PRESENT, PREDOMINANTLY PMN     RARE SQUAMOUS EPITHELIAL CELLS PRESENT     ABUNDANT GRAM POSITIVE COCCI IN PAIRS     IN CLUSTERS IN CHAINS     Performed at [facility]   Culture     Final   Value: ABUNDANT METHICILLIN RESISTANT STAPHYLOCOCCUS AUREUS     Note: RIFAMPIN AND GENTAMICIN SHOULD NOT BE USED AS SINGLE DRUGS FOR TREATMENT OF STAPH INFECTIONS. This organism is presumed to be Clindamycin resistant based on detection of inducible Clindamycin resistance. CRITICAL RESULT CALLED TO, READ BACK BY AND      VERIFIED WITH: Lamondre Wesche RN 135PM 12/03/13 GUSTK     Performed at [facility]   Report Status 12/03/2013 FINAL   Final   Organism ID, Bacteria METHICILLIN RESISTANT STAPHYLOCOCCUS AUREUS   Final  RESPIRATORY VIRUS PANEL     Status: Abnormal   Collection Time    11/30/13  6:00 PM      Result Value Range Status   Source - RVPAN NASOPHARYNGEAL SWAB   Corrected    Comment: CORRECTED ON 01/02 AT 2306: PREVIOUSLY REPORTED  AS NASOPHARYNGEAL SWAB   Respiratory Syncytial Virus A NOT DETECTED   Final   Respiratory Syncytial Virus B NOT DETECTED   Final   Influenza A DETECTED (*)  Final   Influenza B NOT DETECTED   Final   Parainfluenza 1 NOT DETECTED   Final   Parainfluenza 2 NOT DETECTED   Final   Parainfluenza 3 NOT DETECTED   Final   Metapneumovirus NOT DETECTED   Final   Rhinovirus NOT DETECTED   Final   Adenovirus NOT DETECTED   Final   Influenza A H1 NOT DETECTED   Final   Influenza A H3 DETECTED (*)  Final   Comment: (NOTE)           Normal Reference Range for each Analyte: NOT DETECTED     Testing performed using the Luminex xTAG Respiratory Viral Panel test     kit.     This test was developed and its performance characteristics determined     by [facility]. It has not been cleared or approved by the [place]     Food and Drug Administration. This test is used for clinical purposes.     It should not be regarded as investigational or for research. This     laboratory is certified under the [place] (CLIA) as qualified to perform high complexity     clinical laboratory testing.     Performed at [facility]   Studies/Results: Dg Chest 1 View  12/04/2013   CLINICAL DATA:  Follow-up chest x-ray.  Pneumonia.  EXAM: CHEST - 1 VIEW  COMPARISON:  12/03/2013 and 12/02/2013.  FINDINGS: 0640 hr. The endotracheal tube, central line and nasogastric tube appear unchanged. Cardiomegaly, pulmonary edema and bibasilar airspace opacities are unchanged. A small amount of pleural fluid bilaterally is likely. There is no pneumothorax.  IMPRESSION: Unchanged position of support system. Grossly stable bilateral pulmonary opacities and probable small pleural effusions.   Electronically Signed   By: Camie Patience M.D.   On: 12/04/2013 08:08   US Renal  12/04/2013   CLINICAL DATA:  Possible ureteropelvic junction  stone.  EXAM: RENAL/URINARY TRACT ULTRASOUND COMPLETE  COMPARISON:  CT of the abdomen and pelvis 10/31/2013.  FINDINGS: Right Kidney:  Length: 10.4 cm. Echogenicity within normal limits. No mass or hydronephrosis visualized.  Left Kidney:  Length: 9.5 cm. Echogenicity within normal limits. No mass or hydronephrosis visualized.  Bladder:  Partially decompressed with a Foley balloon catheter in place. No mural abnormalities.  IMPRESSION: 1. No hydronephrosis. 2. No acute findings.   Electronically Signed   By: Vinnie Langton M.D.   On: 12/04/2013 13:04   Dg Chest Port 1 View  12/04/2013   CLINICAL DATA:  Respiratory failure.  EXAM: PORTABLE CHEST - 1 VIEW  COMPARISON:  Earlier the same date and 12/03/2013.  FINDINGS: 0746 hr. The endotracheal tube, central line and nasogastric tube appear unchanged. The heart size and mediastinal contours are stable. There is slight worsening of pulmonary edema. Bibasilar atelectasis appears stable. There is no evidence of pneumothorax. Chronic deformity of the medial right clavicle is noted.  IMPRESSION: Slight worsening of pulmonary edema.  Stable support system.   Electronically Signed   By: Camie Patience M.D.   On: 12/04/2013 08:10    Medications:  Scheduled: . ALPRAZolam  0.5 mg Oral TID WC   And  . ALPRAZolam  1 mg Oral QHS  . antiseptic oral rinse  15  mL Mouth Rinse QID  . chlorhexidine  15 mL Mouth Rinse BID  . Chlorhexidine Gluconate Cloth  6 each Topical Q0600  . docusate sodium  100 mg Oral BID  . heparin  5,000 Units Subcutaneous Q8H  . insulin aspart  0-20 Units Subcutaneous Q4H  . insulin detemir  10 Units Subcutaneous QHS  . ipratropium-albuterol  3 mL Nebulization Q6H  . methylPREDNISolone (SOLU-MEDROL) injection  60 mg Intravenous Q6H  . mupirocin ointment  1 application Nasal BID  . pantoprazole (PROTONIX) IV  40 mg Intravenous Daily  . potassium chloride  10 mEq Intravenous Q1 Hr x 6  . sodium chloride  500 mL Intravenous Once  . sodium  chloride  10-40 mL Intracatheter Q12H  . vancomycin  750 mg Intravenous Q12H   Continuous: . feeding supplement (VITAL AF 1.2 CAL) 1,000 mL (12/04/13 1355)  . midazolam (VERSED) infusion 3 mg/hr (12/05/13 0406)   OBS:[id number], morphine injection, sodium chloride  Assesment: She was admitted with severe sepsis with septic shock. She has healthcare associated pneumonia. She has required intubation and mechanical ventilation. She has dementia at baseline and becomes pretty agitated when we tried to take her off of sedation. She has metabolic encephalopathy. She has acute on chronic renal failure. She responded to Lasix at least coincidently with improvement in her physical examination of her chest. Her echocardiogram shows good left ventricular systolic function but evidence of diastolic dysfunction. Principal Problem:   Severe sepsis with septic shock Active Problems:   DCIS (ductal carcinoma in situ) of breast   Hypoadrenalism   Hypertension   Hyperlipidemia   Dementia   Metabolic encephalopathy   Acute on chronic kidney disease, stage 3   HCAP (healthcare-associated pneumonia)    Plan: Continue Lasix. He does not appear that the addition of steroids has made a great deal of difference so I will taper that. She is now on Versed instead of fentanyl the cause of a shortage of fentanyl and I think we can use the Versed to help keep her calm as we try to wean her    LOS: 6 days   , L 12/05/2013, 7:55 AM

## 2013-12-05 NOTE — Progress Notes (Addendum)
[facility]                                                                                Patient Demographics  Bonnie [name], is a 80 y.o. female, DOB - 1927/02/28, [place] date - 11/02/2013   Admitting Physician Nita Sells, MD  Outpatient Primary MD for the patient is Glo Herring., MD  LOS - 6   Chief Complaint  Patient presents with  . Influenza      Brief narrative:   80 y.o. female none HTN, HLD, dementia, chronic constipation and diarrhea, adrenal insufficiency, acid reflux, breast cancer currently in remission and last radiation treatment approximately 9 months ago, anxiety and depression who presents with altered mental status, came to [facility] ed from [facility] 11/28/2013 with flu-like symptoms.   She was admitted with acute respiratory failure secondary to combination of possible HCAP with MRSA-positive ventricular spread along with influenza pneumonia. She is been kept on appropriate antibiotics along with Tamiflu however continues to be on mechanical ventilation under the guidance of pulmonary. Of note proBNP has been elevated.       Assessment & Plan    1. Acute respiratory failure requiring endotracheal intubation and mechanical ventilation secondary to influenza A pneumonia along with sublingual sun pneumonia. - Continue on vancomycin along with Tamiflu, supportive care with nebulizer treatments and mechanical ventilation, pulmonary following. Gentle diuresis as permitted by blood pressure. Prognosis appears poor.     2.Severe sepsis with septic shock - secondary to #1 above, probenecid and trend improving, continue antibiotic support monitor blood culture results. Continue steroid support, will check baseline cortisol.     3. Elevated pro BNP with acute on chronic diastolic CHF with EF 85%. -  gentle diuresis with Lasix as tolerated by blood pressure.     4. History of dementia - from the medication it in [place] state,  supportive care for now. She has mild metabolic encephalopathy due to combination of sedation and acute sickness due to above related problems.     5. History of dyslipidemia. Holding statin for now. Resume once taking oral diet and clinically better.     6. Steroid-induced hyperglycemia. Placed on Levemir along with sliding scale insulin we'll monitor CBGs .  CBG (last 3)   Recent Labs  12/05/13 0020 12/05/13 0353 12/05/13 0757  GLUCAP 233* 200* 198*      7. Hypokalemia. Replaced aggressively with IV and oral supplementation, monitor potassium along with magnesium closely.       8. History of  DCIS (ductal carcinoma in situ) of breast - outpatient followup was improved.     9. L.UPJ Uretric stone on CT scan - nonobstructing, will check renal ultrasound to document stability.      10. Agitation. I did schedule Seroquel and when necessary Haldol.     11. Diarrhea. Likely due to osmolar load from tube feeds, rule out C. difficile, if C. difficile negative initiate Imodium. If skin breakdown begins a concern we will place a rectal tube. Dietitian consulted for tube feeds.       Code Status: Full  Family Communication:  Son Dominica Severin, son informed the patient has poor prognosis, he is  thinking about long-term options  Disposition Plan: TBD   Procedures   Echocardiogram ordered 12/04/2013   CT Chest + abdomen pelvis 12/31    ET tube 12/31 , 22 cm    right subclavian line 12/31    Art line 12/31    NG tube 12/31    Consults  pulmonary   Medications  Scheduled Meds: . ALPRAZolam  0.5 mg Oral TID WC   And  . ALPRAZolam  1 mg Oral QHS  . antiseptic oral rinse  15 mL Mouth Rinse QID  . chlorhexidine  15 mL Mouth Rinse BID  . Chlorhexidine Gluconate Cloth  6 each Topical Q0600  . docusate sodium  100 mg Oral BID  . heparin  5,000 Units Subcutaneous Q8H  . insulin aspart  0-20 Units Subcutaneous Q4H  . insulin detemir  10 Units  Subcutaneous QHS  . ipratropium-albuterol  3 mL Nebulization Q6H  . methylPREDNISolone (SOLU-MEDROL) injection  40 mg Intravenous Q12H  . pantoprazole (PROTONIX) IV  40 mg Intravenous Daily  . potassium chloride  10 mEq Intravenous Q1 Hr x 6  . potassium chloride  40 mEq Oral BID  . QUEtiapine  25 mg Oral BID  . sodium chloride  500 mL Intravenous Once  . sodium chloride  10-40 mL Intracatheter Q12H  . vancomycin  750 mg Intravenous Q12H   Continuous Infusions: . feeding supplement (VITAL AF 1.2 CAL) 1,000 mL (12/04/13 1355)  . midazolam (VERSED) infusion 8 mg/hr (12/05/13 0920)   PRN Meds:.haloperidol lactate, loperamide, midazolam, morphine injection, sodium chloride  DVT Prophylaxis   Heparin    Lab Results  Component Value Date   PLT 371 12/05/2013    Antibiotics   Anti-infectives   Start     Dose/Rate Route Frequency Ordered Stop   12/04/13 1000  vancomycin (VANCOCIN) IVPB 750 mg/150 ml premix     750 mg 150 mL/hr over 60 Minutes Intravenous Every 12 hours 12/04/13 0755     12/02/13 1130  oseltamivir (TAMIFLU) 6 MG/ML suspension 75 mg     75 mg Per Tube 2 times daily 12/02/13 1125 12/04/13 1007   12/01/13 1000  piperacillin-tazobactam (ZOSYN) IVPB 3.375 g  Status:  Discontinued     3.375 g 12.5 mL/hr over 240 Minutes Intravenous Every 8 hours 12/01/13 0741 12/04/13 1037   11/30/13 1600  vancomycin (VANCOCIN) IVPB 1000 mg/200 mL premix  Status:  Discontinued     1,000 mg 200 mL/hr over 60 Minutes Intravenous Every 24 hours 11/21/2013 1631 11/05/2013 1636   11/30/13 1600  vancomycin (VANCOCIN) IVPB 750 mg/150 ml premix  Status:  Discontinued     750 mg 150 mL/hr over 60 Minutes Intravenous Every 24 hours 11/20/2013 1636 12/04/13 0755   11/30/13 1400  ceFEPIme (MAXIPIME) 1 g in dextrose 5 % 50 mL IVPB  Status:  Discontinued     1 g 100 mL/hr over 30 Minutes Intravenous Every 24 hours 11/24/2013 1631 12/01/13 0739   11/30/13 0230  oseltamivir (TAMIFLU) capsule 75 mg  Status:   Discontinued     75 mg Oral 2 times daily 11/30/13 0159 12/02/13 1125   11/17/2013 1600  ceFEPIme (MAXIPIME) 1 g in dextrose 5 % 50 mL IVPB  Status:  Discontinued     1 g 100 mL/hr over 30 Minutes Intravenous 3 times per day 11/30/2013 1557 11/30/2013 1629   11/02/2013 1415  vancomycin (VANCOCIN) IVPB 1000 mg/200 mL premix     1,000 mg 200 mL/hr over 60 Minutes Intravenous  Once 11/21/2013 1409 11/12/2013 1627   10/31/2013 1400  ceFEPIme (MAXIPIME) 1 g in dextrose 5 % 50 mL IVPB     1 g 100 mL/hr over 30 Minutes Intravenous  Once 11/19/2013 1349 11/06/2013 1504          Subjective:   Samary Grippi today remains intubated and sedated in bed does not appear to be in any discomfort  Objective:   Filed Vitals:   12/05/13 0500 12/05/13 0600 12/05/13 0700 12/05/13 0830  BP: 111/49 118/53 131/62   Pulse: 66 70 95   Temp:    97.9 F (36.6 C)  TempSrc:    Axillary  Resp: $Remo'12 12 10   'cOjbM$ Height:      Weight:      SpO2: 95% 96% 94%     Wt Readings from Last 3 Encounters:  12/04/13 64.1 kg (141 lb 5 oz)  11/26/13 49.896 kg (110 lb)  10/24/13 52.164 kg (115 lb)     Intake/Output Summary (Last 24 hours) at 12/05/13 1118 Last data filed at 12/05/13 1022  Gross per 24 hour  Intake 1755.42 ml  Output   4550 ml  Net -2794.58 ml    Exam Intubated and sedated, moves all 4 extremities to painful stimuli .AT,PERRAL Supple Neck,No JVD, No cervical lymphadenopathy appriciated.  Symmetrical Chest wall movement, Good air movement bilaterally, coarse bilateral breath sounds but no wheezing RRR,No Gallops,Rubs or new Murmurs, No Parasternal Heave +ve B.Sounds, Abd Soft, Non tender, No organomegaly appriciated, No rebound - guarding or rigidity. No Cyanosis, Clubbing or edema, No new Rash or bruise      Data Review   Micro Results Recent Results (from the past 240 hour(s))  CULTURE, BLOOD (ROUTINE X 2)     Status: None   Collection Time    11/06/2013  2:34 PM      Result Value Range Status    Specimen Description BLOOD RIGHT ANTECUBITAL   Final   Special Requests     Final   Value: BOTTLES DRAWN AEROBIC AND ANAEROBIC AEB=12CC ANA=10CC   Culture NO GROWTH 5 DAYS   Final   Report Status 12/04/2013 FINAL   Final  CULTURE, BLOOD (ROUTINE X 2)     Status: None   Collection Time    11/24/2013  2:40 PM      Result Value Range Status   Specimen Description BLOOD RIGHT HAND   Final   Special Requests BOTTLES DRAWN AEROBIC AND ANAEROBIC 5CC   Final   Culture NO GROWTH 5 DAYS   Final   Report Status 12/04/2013 FINAL   Final  MRSA PCR SCREENING     Status: Abnormal   Collection Time    11/14/2013  4:04 PM      Result Value Range Status   MRSA by PCR POSITIVE (*) NEGATIVE Final   Comment:            The GeneXpert MRSA Assay (FDA     approved for NASAL specimens     only), is one component of a     comprehensive MRSA colonization     surveillance program. It is not     intended to diagnose MRSA     infection nor to guide or     monitor treatment for     MRSA infections.     RESULT CALLED TO, READ BACK BY AND VERIFIED WITH:     COLLINS,B. AT 1906 ON 11/28/2013 BY BAUGHAM,M.  CULTURE, RESPIRATORY (NON-EXPECTORATED)     Status:  None   Collection Time    11/30/13  3:59 PM      Result Value Range Status   Specimen Description TRACHEAL ASPIRATE   Final   Special Requests BLOODY SPECIMEN   Final   Gram Stain     Final   Value: ABUNDANT WBC PRESENT, PREDOMINANTLY PMN     RARE SQUAMOUS EPITHELIAL CELLS PRESENT     ABUNDANT GRAM POSITIVE COCCI IN PAIRS     IN CLUSTERS IN CHAINS     Performed at [facility]   Culture     Final   Value: ABUNDANT METHICILLIN RESISTANT STAPHYLOCOCCUS AUREUS     Note: RIFAMPIN AND GENTAMICIN SHOULD NOT BE USED AS SINGLE DRUGS FOR TREATMENT OF STAPH INFECTIONS. This organism is presumed to be Clindamycin resistant based on detection of inducible Clindamycin resistance. CRITICAL RESULT CALLED TO, READ BACK BY AND      VERIFIED WITH: HAWKINS RN 135PM  12/03/13 GUSTK     Performed at [facility]   Report Status 12/03/2013 FINAL   Final   Organism ID, Bacteria METHICILLIN RESISTANT STAPHYLOCOCCUS AUREUS   Final  RESPIRATORY VIRUS PANEL     Status: Abnormal   Collection Time    11/30/13  6:00 PM      Result Value Range Status   Source - RVPAN NASOPHARYNGEAL SWAB   Corrected   Comment: CORRECTED ON 01/02 AT 2306: PREVIOUSLY REPORTED AS NASOPHARYNGEAL SWAB   Respiratory Syncytial Virus A NOT DETECTED   Final   Respiratory Syncytial Virus B NOT DETECTED   Final   Influenza A DETECTED (*)  Final   Influenza B NOT DETECTED   Final   Parainfluenza 1 NOT DETECTED   Final   Parainfluenza 2 NOT DETECTED   Final   Parainfluenza 3 NOT DETECTED   Final   Metapneumovirus NOT DETECTED   Final   Rhinovirus NOT DETECTED   Final   Adenovirus NOT DETECTED   Final   Influenza A H1 NOT DETECTED   Final   Influenza A H3 DETECTED (*)  Final   Comment: (NOTE)           Normal Reference Range for each Analyte: NOT DETECTED     Testing performed using the Luminex xTAG Respiratory Viral Panel test     kit.     This test was developed and its performance characteristics determined     by [facility]. It has not been cleared or approved by the [place]     Food and Drug Administration. This test is used for clinical purposes.     It should not be regarded as investigational or for research. This     laboratory is certified under the [place] (CLIA) as qualified to perform high complexity     clinical laboratory testing.     Performed at [facility]    Radiology Reports    Ct Chest W Contrast  11/30/2013   CLINICAL DATA:  Intubated. Abdominal pain, flu. Respiratory failure.  EXAM: CT CHEST WITH CONTRAST  TECHNIQUE: Multidetector CT imaging of the chest was performed during intravenous contrast administration.  CONTRAST:  141mL OMNIPAQUE IOHEXOL 300 MG/ML  SOLN  COMPARISON:  Chest x-ray  earlier today.  FINDINGS: Small bilateral pleural effusions, right greater than left. Bibasilar atelectasis noted. Areas of consolidation noted in the right middle lobe and lingula. Nodules are noted in both lower lobes. 7 mm nodule in the right  lower lobe on image 32. 10 mm nodule in the left lower lobe on image 24. 15 mm nodule in the right lower lobe on image 36 medially. It is unclear if these new nodules could be related to an infectious process or metastasis. Recommend followup after treatment and resolution of acute symptoms.  Endotracheal tube is in the lower trachea. Small scattered mediastinal lymph nodes, none pathologically enlarged. No axillary or hilar adenopathy. Mild cardiomegaly. Scattered coronary artery calcifications.  No acute bony abnormality.  IMPRESSION: Bilateral lower lobe, right middle lobe and lingular areas of consolidation, most likely pneumonia.  Nodular densities/opacities in both lower lobes. This may be related to an infectious process/pneumonia, but recommend followup after resolution of acute symptoms and treatment as metastatic disease cannot be excluded.  Small bilateral pleural effusions, right greater than left.  Cardiomegaly.   Electronically Signed   By: Rolm Baptise M.D.   On: 11/30/2013 16:16   Ct Abdomen Pelvis W Contrast  11/30/2013   CLINICAL DATA:  Abdominal pain.  Intubated.  Flu.  EXAM: CT ABDOMEN AND PELVIS WITH CONTRAST  TECHNIQUE: Multidetector CT imaging of the abdomen and pelvis was performed using the standard protocol following bolus administration of intravenous contrast.  CONTRAST:  161mL OMNIPAQUE IOHEXOL 300 MG/ML  SOLN  COMPARISON:  07/05/2010  FINDINGS: Small bilateral pleural effusions, right slightly greater than left. Airspace disease noted in the lower lobes bilaterally, lingula and right middle lobe. Mild cardiomegaly.  Mild diffuse fatty infiltration of the liver. No focal abnormality. Spleen, pancreas, adrenals are unremarkable. Small cysts in  the kidneys bilaterally. No hydronephrosis. Areas of cortical thinning and scarring bilaterally. Small nonobstructing stone in the lower pole of the left kidney.  NG tube is in place with the tip in the duodenal bulb. Stomach is decompressed, grossly unremarkable. No evidence of bowel obstruction. Sigmoid diverticulosis without active diverticulitis. Foley catheter is in place in the bladder. Calcification noted posterior to the left side of the bladder. This is likely within the left ureter at the left UVJ, measuring 6 mm. No associated hydronephrosis.  Aorta and iliac vessels are heavily calcified, non aneurysmal. No free fluid, free air or adenopathy.  No acute bony abnormality. Degenerative changes in the thoracolumbar spine.  IMPRESSION: Mild fatty infiltration of the liver.  NG tube tip in the duodenum.  Sigmoid diverticulosis.  Apparent 6 mm distal left ureteral stone at the UVJ without obstruction/hydronephrosis. Small nonobstructing stone in the lower pole of the left kidney.  Bilateral pleural effusions. Lingular, right middle lobe and bilateral lower lobe airspace opacities.   Electronically Signed   By: Rolm Baptise M.D.   On: 11/30/2013 15:59   Dg Chest Port 1 View  12/04/2013   CLINICAL DATA:  Respiratory failure.  EXAM: PORTABLE CHEST - 1 VIEW  COMPARISON:  Earlier the same date and 12/03/2013.  FINDINGS: 0746 hr. The endotracheal tube, central line and nasogastric tube appear unchanged. The heart size and mediastinal contours are stable. There is slight worsening of pulmonary edema. Bibasilar atelectasis appears stable. There is no evidence of pneumothorax. Chronic deformity of the medial right clavicle is noted.  IMPRESSION: Slight worsening of pulmonary edema.  Stable support system.   Electronically Signed   By: Camie Patience M.D.   On: 12/04/2013 08:10         CBC  Recent Labs Lab 11/23/2013 1233 11/30/13 0236 12/01/13 0440 12/02/13 0421 12/03/13 0513 12/03/13 1028 12/05/13 0419   WBC 8.6 7.3 6.0 5.2 7.2 9.5 11.0*  HGB 13.1 12.8 10.0* 8.7* 8.9* 9.7* 9.1*  HCT 38.8 37.6 29.7* 26.4* 26.6* 29.1* 26.8*  PLT 223 200 200 216 279 315 371  MCV 86.2 85.6 86.6 87.1 85.8 85.8 85.9  MCH 29.1 29.2 29.2 28.7 28.7 28.6 29.2  MCHC 33.8 34.0 33.7 33.0 33.5 33.3 34.0  RDW 13.6 13.6 14.2 14.4 14.1 14.0 14.1  LYMPHSABS 0.5* 0.9 0.3* 0.3*  --  0.5*  --   MONOABS 0.6 0.6 0.4 0.5  --  1.1*  --   EOSABS 0.0 0.0 0.0 0.0  --  0.1  --   BASOSABS 0.0 0.0 0.0 0.0  --  0.0  --     Chemistries   Recent Labs Lab 11/19/2013 1233 11/30/13 0236 12/01/13 0440 12/01/13 1259 12/02/13 0421 12/03/13 0512 12/03/13 0513 12/03/13 1720 12/04/13 0450 12/05/13 0419  NA 132* 135* 139 137 140  --  141 141 143 147  K 3.9 3.5* 3.7 3.7 3.3*  --  2.6* 3.1* 2.8* 2.9*  CL 93* 95* 101 103 107  --  105 102 101 103  CO2 $Re'27 24 24 25 23  'tDL$ --  $R'26 23 30 'GQ$ 34*  GLUCOSE 170* 161* 123* 122* 109*  --  104* 205* 296* 220*  BUN 15 11 24* 22 22  --  20 21 24* 33*  CREATININE 0.79 0.70 0.91 0.76 0.77  --  0.77 0.72 0.74 0.66  CALCIUM 8.9 8.8 8.3* 8.3* 8.3*  --  8.6 8.6 8.9 8.6  MG  --   --   --   --   --  1.7  --   --   --  2.0  AST $Re'22 21 16 14 10  '[place]$ --   --   --   --   --   ALT $Re'20 16 11 11 9  'Nmv$ --   --   --   --   --   ALKPHOS 81 58 55 52 49  --   --   --   --   --   BILITOT 0.4 0.4 0.5 0.5 0.6  --   --   --   --   --    ------------------------------------------------------------------------------------------------------------------ estimated creatinine clearance is 46 ml/min (by C-G formula based on Cr of 0.66). ------------------------------------------------------------------------------------------------------------------ No results found for this basename: HGBA1C,  in the last 72 hours ------------------------------------------------------------------------------------------------------------------ No results found for this basename: CHOL, HDL, LDLCALC, TRIG, CHOLHDL, LDLDIRECT,  in the last 72  hours ------------------------------------------------------------------------------------------------------------------ No results found for this basename: TSH, T4TOTAL, FREET3, T3FREE, THYROIDAB,  in the last 72 hours ------------------------------------------------------------------------------------------------------------------ No results found for this basename: VITAMINB12, FOLATE, FERRITIN, TIBC, IRON, RETICCTPCT,  in the last 72 hours  Coagulation profile  Recent Labs Lab 12/02/13 0421  INR 1.05    No results found for this basename: DDIMER,  in the last 72 hours  Cardiac Enzymes  Recent Labs Lab 10/31/2013 1433 11/07/2013 2013 11/30/13 0236  TROPONINI <0.30 <0.30 <0.30   ------------------------------------------------------------------------------------------------------------------ No components found with this basename: POCBNP,      Time Spent in minutes   35   SINGH,PRASHANT K M.D on 12/05/2013 at 11:18 AM  Between 7am to 7pm - Pager - [phone]  After 7pm go to [email or web address] - password [email or web address]  And look for the night coverage person covering for me after hours  [facility] Office  [phone]

## 2013-12-06 ENCOUNTER — Inpatient Hospital Stay (HOSPITAL_COMMUNITY): Payer: Medicare Other

## 2013-12-06 DIAGNOSIS — J9600 Acute respiratory failure, unspecified whether with hypoxia or hypercapnia: Secondary | ICD-10-CM

## 2013-12-06 DIAGNOSIS — I5033 Acute on chronic diastolic (congestive) heart failure: Secondary | ICD-10-CM

## 2013-12-06 DIAGNOSIS — J441 Chronic obstructive pulmonary disease with (acute) exacerbation: Secondary | ICD-10-CM

## 2013-12-06 LAB — BASIC METABOLIC PANEL
BUN: 33 mg/dL — ABNORMAL HIGH (ref 6–23)
CHLORIDE: 101 meq/L (ref 96–112)
CO2: 34 mEq/L — ABNORMAL HIGH (ref 19–32)
Calcium: 9.1 mg/dL (ref 8.4–10.5)
Creatinine, Ser: 0.66 mg/dL (ref 0.50–1.10)
GFR calc non Af Amer: 79 mL/min — ABNORMAL LOW (ref >90)
Glucose, Bld: 124 mg/dL — ABNORMAL HIGH (ref 70–99)
POTASSIUM: 3.6 meq/L — AB (ref 3.7–5.3)
SODIUM: 145 meq/L (ref 137–147)

## 2013-12-06 LAB — URINALYSIS W MICROSCOPIC + REFLEX CULTURE
Bilirubin Urine: NEGATIVE
GLUCOSE, UA: NEGATIVE mg/dL
Ketones, ur: NEGATIVE mg/dL
Leukocytes, UA: NEGATIVE
Nitrite: NEGATIVE
Protein, ur: NEGATIVE mg/dL
SPECIFIC GRAVITY, URINE: 1.02 (ref 1.005–1.030)
Urobilinogen, UA: 0.2 mg/dL (ref 0.0–1.0)
pH: 8 (ref 5.0–8.0)

## 2013-12-06 LAB — GLUCOSE, CAPILLARY
GLUCOSE-CAPILLARY: 147 mg/dL — AB (ref 70–99)
GLUCOSE-CAPILLARY: 172 mg/dL — AB (ref 70–99)
GLUCOSE-CAPILLARY: 165 mg/dL — AB (ref 70–99)
Glucose-Capillary: 123 mg/dL — ABNORMAL HIGH (ref 70–99)
Glucose-Capillary: 130 mg/dL — ABNORMAL HIGH (ref 70–99)
Glucose-Capillary: 133 mg/dL — ABNORMAL HIGH (ref 70–99)
Glucose-Capillary: 208 mg/dL — ABNORMAL HIGH (ref 70–99)

## 2013-12-06 LAB — BLOOD GAS, ARTERIAL
Acid-Base Excess: 10.3 mmol/L — ABNORMAL HIGH (ref 0.0–2.0)
BICARBONATE: 34.2 meq/L — AB (ref 20.0–24.0)
Drawn by: 38235
FIO2: 0.4 %
MECHVT: 450 mL
O2 SAT: 98.2 %
PEEP/CPAP: 5 cmH2O
PO2 ART: 108 mmHg — AB (ref 80.0–100.0)
Patient temperature: 37
RATE: 12 resp/min
TCO2: 31.2 mmol/L (ref 0–100)
pCO2 arterial: 44.3 mmHg (ref 35.0–45.0)
pH, Arterial: 7.499 — ABNORMAL HIGH (ref 7.350–7.450)

## 2013-12-06 LAB — CBC
HCT: 29.9 % — ABNORMAL LOW (ref 36.0–46.0)
Hemoglobin: 9.8 g/dL — ABNORMAL LOW (ref 12.0–15.0)
MCH: 28.6 pg (ref 26.0–34.0)
MCHC: 32.8 g/dL (ref 30.0–36.0)
MCV: 87.2 fL (ref 78.0–100.0)
PLATELETS: 458 10*3/uL — AB (ref 150–400)
RBC: 3.43 MIL/uL — ABNORMAL LOW (ref 3.87–5.11)
RDW: 14.5 % (ref 11.5–15.5)
WBC: 13.8 10*3/uL — AB (ref 4.0–10.5)

## 2013-12-06 LAB — MAGNESIUM: MAGNESIUM: 2 mg/dL (ref 1.5–2.5)

## 2013-12-06 LAB — CLOSTRIDIUM DIFFICILE BY PCR: CDIFFPCR: NEGATIVE

## 2013-12-06 MED ORDER — FUROSEMIDE 10 MG/ML IJ SOLN
40.0000 mg | Freq: Every day | INTRAMUSCULAR | Status: DC
  Administered 2013-12-06 – 2013-12-09 (×4): 40 mg via INTRAVENOUS
  Filled 2013-12-06 (×4): qty 4

## 2013-12-06 MED ORDER — ACETAMINOPHEN 650 MG RE SUPP
650.0000 mg | Freq: Four times a day (QID) | RECTAL | Status: DC | PRN
  Filled 2013-12-06: qty 1

## 2013-12-06 MED ORDER — MIDAZOLAM HCL 50 MG/10ML IJ SOLN
INTRAMUSCULAR | Status: AC
  Filled 2013-12-06: qty 1

## 2013-12-06 MED ORDER — MIDAZOLAM HCL 5 MG/ML IJ SOLN
0.0000 mg/h | INTRAMUSCULAR | Status: DC
  Filled 2013-12-06 (×2): qty 20

## 2013-12-06 MED ORDER — AMLODIPINE BESYLATE 5 MG PO TABS
10.0000 mg | ORAL_TABLET | Freq: Every day | ORAL | Status: DC
  Administered 2013-12-06 – 2013-12-07 (×2): 10 mg via ORAL
  Filled 2013-12-06 (×2): qty 2

## 2013-12-06 MED ORDER — ACETAMINOPHEN 325 MG PO TABS
650.0000 mg | ORAL_TABLET | Freq: Four times a day (QID) | ORAL | Status: DC | PRN
  Administered 2013-12-06 – 2013-12-11 (×2): 650 mg via ORAL
  Filled 2013-12-06 (×2): qty 2

## 2013-12-06 NOTE — Progress Notes (Signed)
Weaned pt on cpap/ps 5/5. Pt became very agitated and she would not open her eyes or respond. He HR increased and her RR would go over 40.

## 2013-12-06 NOTE — Progress Notes (Signed)
[facility]                                                                                Patient Demographics  Bonnie [name], is a 80 y.o. female, DOB - [birth year], [place] date - 11/08/2013   Admitting Physician Nita Sells, MD  Outpatient Primary MD for the patient is Glo Herring., MD  LOS - 7   Chief Complaint  Patient presents with  . Influenza      Brief narrative:   80 y.o. female none HTN, HLD, dementia, chronic constipation and diarrhea, adrenal insufficiency, acid reflux, breast cancer currently in remission and last radiation treatment approximately 9 months ago, anxiety and depression who presents with altered mental status, came to [facility] with flu-like symptoms.   She was admitted with acute respiratory failure secondary to combination of possible HCAP with MRSA-positive ventricular spread along with influenza pneumonia. She is been kept on appropriate antibiotics along with Tamiflu however continues to be on mechanical ventilation under the guidance of pulmonary. Of note proBNP has been elevated.       Assessment & Plan    1. Acute respiratory failure requiring endotracheal intubation and mechanical ventilation secondary to influenza A pneumonia along with possible MRSA pneumonia. - Continue on vancomycin along with Tamiflu, supportive care with nebulizer treatments and mechanical ventilation, pulmonary following. Steroids per pulmonary. Gentle diuresis as permitted by blood pressure. Since leukocytosis slightly worse we'll repeat chest x-ray, UA and sputum culture. Prognosis appears poor. Discussed with her sons in detail, if no improvement in the next few days they want comfort measures.     2.Severe sepsis with septic shock - secondary to #1 above, as above overall hemodynamically stable now after IVF initially, continue antibiotic support monitor blood culture results.      3. Elevated pro BNP  with acute on chronic diastolic CHF with EF 19%. -  gentle diuresis with Lasix as tolerated by blood pressure.     4. History of dementia - from the medication it in [place] state, supportive care for now. She has mild metabolic encephalopathy due to combination of sedation and acute sickness due to above related problems.     5. History of dyslipidemia. Holding statin for now. Resume once taking oral diet and clinically better.     6. Steroid-induced hyperglycemia. Placed on Levemir along with sliding scale insulin we'll monitor CBGs .  CBG (last 3)   Recent Labs  12/05/13 2354 12/06/13 0352 12/06/13 0746  GLUCAP 123* 133* 130*      7. Hypokalemia. Replaced aggressively with IV and oral supplementation, currently stable, monitor potassium along with magnesium closely.       8. History of  DCIS (ductal carcinoma in situ) of breast - outpatient followup was improved.     9. L.UPJ Uretric stone on CT scan - nonobstructing, stable repeat renal ultrasound.      10. Agitation during episodes of weaning. I did schedule Seroquel and when necessary Haldol.     11. Diarrhea. Likely due to osmolar load from tube feeds, rule out C. Difficile PCR ordered 12-05-13, RN reminded for sample collection, if C. difficile negative  initiate Imodium. If skin breakdown begins a concern we will place a rectal tube. Dietitian consulted for tube feeds.     12. Hypertension. We'll add low-dose Norvasc and monitor       Code Status: Full  Family Communication:  Discuss with patient's sons in detail, they are aware that her prognosis is poor, if she does not improve clinically in the next 2-3 days they want full comfort measures.  Disposition Plan: TBD    Procedures    CT Chest + abdomen pelvis 12/31    ET tube 12/31 , 22 cm    right subclavian line 12/31    Art line 12/31    NG tube 12/31   Renal ultrasound on 03/20/2014   Echocardiogram   12/04/2013   Flexiseal 12-05-13    Consults  pulmonary   Medications  Scheduled Meds: . ALPRAZolam  0.5 mg Oral TID WC   And  . ALPRAZolam  1 mg Oral QHS  . antiseptic oral rinse  15 mL Mouth Rinse QID  . chlorhexidine  15 mL Mouth Rinse BID  . docusate sodium  100 mg Oral BID  . feeding supplement (PRO-STAT SUGAR FREE 64)  30 mL Oral BID  . feeding supplement (VITAL AF 1.2 CAL)  1,000 mL Per Tube Q24H  . furosemide  40 mg Intravenous Daily  . heparin  5,000 Units Subcutaneous Q8H  . insulin aspart  0-20 Units Subcutaneous Q4H  . insulin detemir  10 Units Subcutaneous QHS  . ipratropium-albuterol  3 mL Nebulization Q6H  . methylPREDNISolone (SOLU-MEDROL) injection  40 mg Intravenous Q12H  . pantoprazole (PROTONIX) IV  40 mg Intravenous Daily  . potassium chloride  40 mEq Oral BID  . QUEtiapine  25 mg Oral BID  . sodium chloride  500 mL Intravenous Once  . sodium chloride  10-40 mL Intracatheter Q12H  . vancomycin  750 mg Intravenous Q12H   Continuous Infusions: . midazolam (VERSED) infusion 10 mg/hr (12/06/13 0900)   PRN Meds:.haloperidol lactate, loperamide, midazolam, morphine injection, sodium chloride  DVT Prophylaxis   Heparin    Lab Results  Component Value Date   PLT 458* 12/06/2013    Antibiotics   Anti-infectives   Start     Dose/Rate Route Frequency Ordered Stop   12/04/13 1000  vancomycin (VANCOCIN) IVPB 750 mg/150 ml premix     750 mg 150 mL/hr over 60 Minutes Intravenous Every 12 hours 12/04/13 0755     12/02/13 1130  oseltamivir (TAMIFLU) 6 MG/ML suspension 75 mg     75 mg Per Tube 2 times daily 12/02/13 1125 12/04/13 1007   12/01/13 1000  piperacillin-tazobactam (ZOSYN) IVPB 3.375 g  Status:  Discontinued     3.375 g 12.5 mL/hr over 240 Minutes Intravenous Every 8 hours 12/01/13 0741 12/04/13 1037   11/30/13 1600  vancomycin (VANCOCIN) IVPB 1000 mg/200 mL premix  Status:  Discontinued     1,000 mg 200 mL/hr over 60 Minutes Intravenous Every 24  hours 11/24/2013 1631 11/17/2013 1636   11/30/13 1600  vancomycin (VANCOCIN) IVPB 750 mg/150 ml premix  Status:  Discontinued     750 mg 150 mL/hr over 60 Minutes Intravenous Every 24 hours 11/27/2013 1636 12/04/13 0755   11/30/13 1400  ceFEPIme (MAXIPIME) 1 g in dextrose 5 % 50 mL IVPB  Status:  Discontinued     1 g 100 mL/hr over 30 Minutes Intravenous Every 24 hours 11/25/2013 1631 12/01/13 0739   11/30/13 0230  oseltamivir (TAMIFLU) capsule 75 mg  Status:  Discontinued     75 mg Oral 2 times daily 11/30/13 0159 12/02/13 1125   11/28/2013 1600  ceFEPIme (MAXIPIME) 1 g in dextrose 5 % 50 mL IVPB  Status:  Discontinued     1 g 100 mL/hr over 30 Minutes Intravenous 3 times per day 11/25/2013 1557 11/02/2013 1629   11/19/2013 1415  vancomycin (VANCOCIN) IVPB 1000 mg/200 mL premix     1,000 mg 200 mL/hr over 60 Minutes Intravenous  Once 11/02/2013 1409 11/21/2013 1627   11/21/2013 1400  ceFEPIme (MAXIPIME) 1 g in dextrose 5 % 50 mL IVPB     1 g 100 mL/hr over 30 Minutes Intravenous  Once 11/18/2013 1349 11/14/2013 1504          Subjective:   Danitra Riddle today remains intubated and sedated in bed does not appear to be in any discomfort.   Objective:   Filed Vitals:   12/06/13 0830 12/06/13 0845 12/06/13 0900 12/06/13 0915  BP: 167/87 165/79 162/81 156/85  Pulse: 35 119 117 117  Temp:      TempSrc:      Resp:      Height:      Weight:      SpO2: 80% 92% 90% 93%    Wt Readings from Last 3 Encounters:  12/06/13 62.3 kg (137 lb 5.6 oz)  11/26/13 49.896 kg (110 lb)  10/24/13 52.164 kg (115 lb)     Intake/Output Summary (Last 24 hours) at 12/06/13 1016 Last data filed at 12/06/13 0900  Gross per 24 hour  Intake 2575.06 ml  Output   5000 ml  Net -2424.94 ml    Exam Intubated and sedated, moves all 4 extremities to painful stimuli Golden.AT,PERRAL Supple Neck,No JVD, No cervical lymphadenopathy appriciated.  Symmetrical Chest wall movement, Good air movement bilaterally, coarse bilateral  breath sounds but no wheezing RRR,No Gallops,Rubs or new Murmurs, No Parasternal Heave +ve B.Sounds, Abd Soft, Non tender, No organomegaly appriciated, No rebound - guarding or rigidity. No Cyanosis, Clubbing or edema, No new Rash or bruise      Data Review   Micro Results Recent Results (from the past 240 hour(s))  CULTURE, BLOOD (ROUTINE X 2)     Status: None   Collection Time    11/23/2013  2:34 PM      Result Value Range Status   Specimen Description BLOOD RIGHT ANTECUBITAL   Final   Special Requests     Final   Value: BOTTLES DRAWN AEROBIC AND ANAEROBIC AEB=12CC ANA=10CC   Culture NO GROWTH 5 DAYS   Final   Report Status 12/04/2013 FINAL   Final  CULTURE, BLOOD (ROUTINE X 2)     Status: None   Collection Time    11/18/2013  2:40 PM      Result Value Range Status   Specimen Description BLOOD RIGHT HAND   Final   Special Requests BOTTLES DRAWN AEROBIC AND ANAEROBIC 5CC   Final   Culture NO GROWTH 5 DAYS   Final   Report Status 12/04/2013 FINAL   Final  MRSA PCR SCREENING     Status: Abnormal   Collection Time    11/16/2013  4:04 PM      Result Value Range Status   MRSA by PCR POSITIVE (*) NEGATIVE Final   Comment:            The GeneXpert MRSA Assay (FDA     approved for NASAL specimens     only), is one component of  a     comprehensive MRSA colonization     surveillance program. It is not     intended to diagnose MRSA     infection nor to guide or     monitor treatment for     MRSA infections.     RESULT CALLED TO, READ BACK BY AND VERIFIED WITH:     COLLINS,B. AT 1906 ON 10/31/2013 BY BAUGHAM,M.  CULTURE, RESPIRATORY (NON-EXPECTORATED)     Status: None   Collection Time    11/30/13  3:59 PM      Result Value Range Status   Specimen Description TRACHEAL ASPIRATE   Final   Special Requests BLOODY SPECIMEN   Final   Gram Stain     Final   Value: ABUNDANT WBC PRESENT, PREDOMINANTLY PMN     RARE SQUAMOUS EPITHELIAL CELLS PRESENT     ABUNDANT GRAM POSITIVE COCCI IN PAIRS      IN CLUSTERS IN CHAINS     Performed at [facility]   Culture     Final   Value: ABUNDANT METHICILLIN RESISTANT STAPHYLOCOCCUS AUREUS     Note: RIFAMPIN AND GENTAMICIN SHOULD NOT BE USED AS SINGLE DRUGS FOR TREATMENT OF STAPH INFECTIONS. This organism is presumed to be Clindamycin resistant based on detection of inducible Clindamycin resistance. CRITICAL RESULT CALLED TO, READ BACK BY AND      VERIFIED WITH: HAWKINS RN 135PM 12/03/13 GUSTK     Performed at [facility]   Report Status 12/03/2013 FINAL   Final   Organism ID, Bacteria METHICILLIN RESISTANT STAPHYLOCOCCUS AUREUS   Final  RESPIRATORY VIRUS PANEL     Status: Abnormal   Collection Time    11/30/13  6:00 PM      Result Value Range Status   Source - RVPAN NASOPHARYNGEAL SWAB   Corrected   Comment: CORRECTED ON 01/02 AT 2306: PREVIOUSLY REPORTED AS NASOPHARYNGEAL SWAB   Respiratory Syncytial Virus A NOT DETECTED   Final   Respiratory Syncytial Virus B NOT DETECTED   Final   Influenza A DETECTED (*)  Final   Influenza B NOT DETECTED   Final   Parainfluenza 1 NOT DETECTED   Final   Parainfluenza 2 NOT DETECTED   Final   Parainfluenza 3 NOT DETECTED   Final   Metapneumovirus NOT DETECTED   Final   Rhinovirus NOT DETECTED   Final   Adenovirus NOT DETECTED   Final   Influenza A H1 NOT DETECTED   Final   Influenza A H3 DETECTED (*)  Final   Comment: (NOTE)           Normal Reference Range for each Analyte: NOT DETECTED     Testing performed using the Luminex xTAG Respiratory Viral Panel test     kit.     This test was developed and its performance characteristics determined     by [facility]. It has not been cleared or approved by the [place]     Food and Drug Administration. This test is used for clinical purposes.     It should not be regarded as investigational or for research. This     laboratory is certified under the [place] (CLIA) as qualified to  perform high complexity     clinical laboratory testing.     Performed at [facility]    Radiology Reports    Ct Chest W Contrast  11/30/2013   CLINICAL DATA:  Intubated. Abdominal pain, flu. Respiratory failure.  EXAM: CT CHEST WITH CONTRAST  TECHNIQUE: Multidetector CT imaging of the chest was performed during intravenous contrast administration.  CONTRAST:  125mL OMNIPAQUE IOHEXOL 300 MG/ML  SOLN  COMPARISON:  Chest x-ray earlier today.  FINDINGS: Small bilateral pleural effusions, right greater than left. Bibasilar atelectasis noted. Areas of consolidation noted in the right middle lobe and lingula. Nodules are noted in both lower lobes. 7 mm nodule in the right lower lobe on image 32. 10 mm nodule in the left lower lobe on image 24. 15 mm nodule in the right lower lobe on image 36 medially. It is unclear if these new nodules could be related to an infectious process or metastasis. Recommend followup after treatment and resolution of acute symptoms.  Endotracheal tube is in the lower trachea. Small scattered mediastinal lymph nodes, none pathologically enlarged. No axillary or hilar adenopathy. Mild cardiomegaly. Scattered coronary artery calcifications.  No acute bony abnormality.  IMPRESSION: Bilateral lower lobe, right middle lobe and lingular areas of consolidation, most likely pneumonia.  Nodular densities/opacities in both lower lobes. This may be related to an infectious process/pneumonia, but recommend followup after resolution of acute symptoms and treatment as metastatic disease cannot be excluded.  Small bilateral pleural effusions, right greater than left.  Cardiomegaly.   Electronically Signed   By: Rolm Baptise M.D.   On: 11/30/2013 16:16   Ct Abdomen Pelvis W Contrast  11/30/2013   CLINICAL DATA:  Abdominal pain.  Intubated.  Flu.  EXAM: CT ABDOMEN AND PELVIS WITH CONTRAST  TECHNIQUE: Multidetector CT imaging of the abdomen and pelvis was performed using the standard  protocol following bolus administration of intravenous contrast.  CONTRAST:  175mL OMNIPAQUE IOHEXOL 300 MG/ML  SOLN  COMPARISON:  07/05/2010  FINDINGS: Small bilateral pleural effusions, right slightly greater than left. Airspace disease noted in the lower lobes bilaterally, lingula and right middle lobe. Mild cardiomegaly.  Mild diffuse fatty infiltration of the liver. No focal abnormality. Spleen, pancreas, adrenals are unremarkable. Small cysts in the kidneys bilaterally. No hydronephrosis. Areas of cortical thinning and scarring bilaterally. Small nonobstructing stone in the lower pole of the left kidney.  NG tube is in place with the tip in the duodenal bulb. Stomach is decompressed, grossly unremarkable. No evidence of bowel obstruction. Sigmoid diverticulosis without active diverticulitis. Foley catheter is in place in the bladder. Calcification noted posterior to the left side of the bladder. This is likely within the left ureter at the left UVJ, measuring 6 mm. No associated hydronephrosis.  Aorta and iliac vessels are heavily calcified, non aneurysmal. No free fluid, free air or adenopathy.  No acute bony abnormality. Degenerative changes in the thoracolumbar spine.  IMPRESSION: Mild fatty infiltration of the liver.  NG tube tip in the duodenum.  Sigmoid diverticulosis.  Apparent 6 mm distal left ureteral stone at the UVJ without obstruction/hydronephrosis. Small nonobstructing stone in the lower pole of the left kidney.  Bilateral pleural effusions. Lingular, right middle lobe and bilateral lower lobe airspace opacities.   Electronically Signed   By: Rolm Baptise M.D.   On: 11/30/2013 15:59   Dg Chest Port 1 View  12/04/2013   CLINICAL DATA:  Respiratory failure.  EXAM: PORTABLE CHEST - 1 VIEW  COMPARISON:  Earlier the same date and 12/03/2013.  FINDINGS: 0746 hr. The endotracheal tube, central line and nasogastric tube appear unchanged. The heart size and mediastinal contours are stable. There is  slight worsening of pulmonary edema. Bibasilar atelectasis appears  stable. There is no evidence of pneumothorax. Chronic deformity of the medial right clavicle is noted.  IMPRESSION: Slight worsening of pulmonary edema.  Stable support system.   Electronically Signed   By: Camie Patience M.D.   On: 12/04/2013 08:10         CBC  Recent Labs Lab 11/28/2013 1233 11/30/13 0236 12/01/13 0440 12/02/13 0421 12/03/13 0513 12/03/13 1028 12/05/13 0419 12/06/13 0439  WBC 8.6 7.3 6.0 5.2 7.2 9.5 11.0* 13.8*  HGB 13.1 12.8 10.0* 8.7* 8.9* 9.7* 9.1* 9.8*  HCT 38.8 37.6 29.7* 26.4* 26.6* 29.1* 26.8* 29.9*  PLT 223 200 200 216 279 315 371 458*  MCV 86.2 85.6 86.6 87.1 85.8 85.8 85.9 87.2  MCH 29.1 29.2 29.2 28.7 28.7 28.6 29.2 28.6  MCHC 33.8 34.0 33.7 33.0 33.5 33.3 34.0 32.8  RDW 13.6 13.6 14.2 14.4 14.1 14.0 14.1 14.5  LYMPHSABS 0.5* 0.9 0.3* 0.3*  --  0.5*  --   --   MONOABS 0.6 0.6 0.4 0.5  --  1.1*  --   --   EOSABS 0.0 0.0 0.0 0.0  --  0.1  --   --   BASOSABS 0.0 0.0 0.0 0.0  --  0.0  --   --     Chemistries   Recent Labs Lab 11/22/2013 1233 11/30/13 0236 12/01/13 0440 12/01/13 1259 12/02/13 0421 12/03/13 0512 12/03/13 0513 12/03/13 1720 12/04/13 0450 12/05/13 0419 12/06/13 0439  NA 132* 135* 139 137 140  --  141 141 143 147 145  K 3.9 3.5* 3.7 3.7 3.3*  --  2.6* 3.1* 2.8* 2.9* 3.6*  CL 93* 95* 101 103 107  --  105 102 101 103 101  CO2 _0 --  _1 34* 34*  GLUCOSE 170* 161* 123* 122* 109*  --  104* 205* 296* 220* 124*  BUN 15 11 24* 22 22  --  20 21 24* 33* 33*  CREATININE 0.79 0.70 0.91 0.76 0.77  --  0.77 0.72 0.74 0.66 0.66  CALCIUM 8.9 8.8 8.3* 8.3* 8.3*  --  8.6 8.6 8.9 8.6 9.1  MG  --   --   --   --   --  1.7  --   --   --  2.0 2.0  AST _2 --   --   --   --   --   --   ALT _3 --   --   --   --   --   --   ALKPHOS 81 58 55 52 49  --   --   --   --   --   --   BILITOT 0.4 0.4 0.5 0.5 0.6  --   --   --   --   --   --     ------------------------------------------------------------------------------------------------------------------ estimated creatinine clearance is 45.5 ml/min (by C-G formula based on Cr of 0.66). ------------------------------------------------------------------------------------------------------------------ No results found for this basename: HGBA1C,  in the last 72 hours ------------------------------------------------------------------------------------------------------------------ No results found for this basename: CHOL, HDL, LDLCALC, TRIG, CHOLHDL, LDLDIRECT,  in the last 72 hours ------------------------------------------------------------------------------------------------------------------ No results found for this basename: TSH, T4TOTAL, FREET3, T3FREE, THYROIDAB,  in the last 72 hours ------------------------------------------------------------------------------------------------------------------ No results found for this basename: VITAMINB12, FOLATE, FERRITIN, TIBC, IRON, RETICCTPCT,  in the last 72 hours  Coagulation profile  Recent Labs Lab 12/02/13 0421  INR 1.05    No  results found for this basename: DDIMER,  in the last 72 hours  Cardiac Enzymes  Recent Labs Lab 11/15/2013 1433 11/28/2013 2013 11/30/13 0236  TROPONINI <0.30 <0.30 <0.30   ------------------------------------------------------------------------------------------------------------------ No components found with this basename: POCBNP,      Time Spent in minutes   35   Victoire Deans K M.D on 12/06/2013 at 10:16 AM  Between 7am to 7pm - Pager - [phone]  After 7pm go to [email or web address] - password [email or web address]  And look for the night coverage person covering for me after hours  [facility] Office  [phone]

## 2013-12-06 NOTE — Progress Notes (Signed)
NUTRITION FOLLOW UP  Intervention:    Continuous Vital AF @ 35 ml/hr via OGT is providing 1008 kcals, 63 gr protein, and 584 ml fluid daily. ProStat 30 ml BID adds 200 kcals and 30 gr protein.   Total regimen provides 1208 kcals, 93 grams protein, and 681 ml fluid daily, meeting 100% estimated calorie needs and 100% of estimated protein needs.   If agreeable with MD fluid goals add free water flushes: 150 ml every 4 hrs.  Nutrition Dx:   Inadequate oral intake related to inability to eat as evidenced by mechanical ventilation requiring NPO status; ongoing.  Goal:   Pt to meet >/= 90% of their estimated nutrition needs; met  Monitor:   Respiratory status, TF tolerance, weight changes, labs, changes in status  Assessment:  Pt enteral nutrition tolerated with 0 ml residuals. Pt prognosis is poor, MD has discussed with her son who is agreeable to comfort measures if pt fails to improve within few days.  She remains intubated on vent. Pt has failed weaning attempts to to increased heart rate, increase blood pressure, and agitation. Receiving diuretics with her output @ 3.8 liters last 24 hr. No IVF's at this time. Pt is receiving free water 30-50 ml with medication administration.   Noted MD concern regarding diarrhea due to increased osmolarity. Last BM noted today @ 0800.   MV: 5.4 L/min Temp (24hrs), Avg:99.7 F (37.6 C), Min:98.8 F (37.1 C), Max:100.1 F (37.8 C)  Propofol: n/a ml/hr  Height: Ht Readings from Last 1 Encounters:  12/04/13 $RemoveB'5\' 2"'[id number]$  (1.575 m)    Weight Status:   Wt Readings from Last 1 Encounters:  12/06/13 137 lb 5.6 oz (62.3 kg)    Re-estimated needs:  Kcal: 1212 kcals daily Protein: 80-96 grams daily Fluid: 1.2-1.3 L daily  Skin: WDL  Diet Order: NPO   Intake/Output Summary (Last 24 hours) at 12/06/13 1410 Last data filed at 12/06/13 1103  Gross per 24 hour  Intake 2087.46 ml  Output   3800 ml  Net -1712.54 ml    Last BM:  12/06/13   Labs:   Recent Labs Lab 12/03/13 0512  12/03/13 1720 12/04/13 0450 12/05/13 0419 12/06/13 0439  NA  --   < > 141 143 147 145  K  --   < > 3.1* 2.8* 2.9* 3.6*  CL  --   < > 102 101 103 101  CO2  --   < > 23 30 34* 34*  BUN  --   < > 21 24* 33* 33*  CREATININE  --   < > 0.72 0.74 0.66 0.66  CALCIUM  --   < > 8.6 8.9 8.6 9.1  MG 1.7  --   --   --  2.0 2.0  PHOS  --   --   --  2.8  --   --   GLUCOSE  --   < > 205* 296* 220* 124*  < > = values in this interval not displayed.  CBG (last 3)   Recent Labs  12/06/13 0352 12/06/13 0746 12/06/13 1119  GLUCAP 133* 130* 147*    Scheduled Meds: . ALPRAZolam  0.5 mg Oral TID WC   And  . ALPRAZolam  1 mg Oral QHS  . amLODipine  10 mg Oral Daily  . antiseptic oral rinse  15 mL Mouth Rinse QID  . chlorhexidine  15 mL Mouth Rinse BID  . docusate sodium  100 mg Oral BID  . feeding supplement (PRO-STAT  SUGAR FREE 64)  30 mL Oral BID  . feeding supplement (VITAL AF 1.2 CAL)  1,000 mL Per Tube Q24H  . furosemide  40 mg Intravenous Daily  . heparin  5,000 Units Subcutaneous Q8H  . insulin aspart  0-20 Units Subcutaneous Q4H  . insulin detemir  10 Units Subcutaneous QHS  . ipratropium-albuterol  3 mL Nebulization Q6H  . methylPREDNISolone (SOLU-MEDROL) injection  40 mg Intravenous Q12H  . pantoprazole (PROTONIX) IV  40 mg Intravenous Daily  . potassium chloride  40 mEq Oral BID  . QUEtiapine  25 mg Oral BID  . sodium chloride  500 mL Intravenous Once  . sodium chloride  10-40 mL Intracatheter Q12H  . vancomycin  750 mg Intravenous Q12H    Continuous Infusions: . midazolam (VERSED) infusion 10 mg/hr (12/06/13 0900)    Colman Cater MS,RD,CSG,LDN Office: [phone] Pager: [phone]

## 2013-12-06 NOTE — Progress Notes (Signed)
Pt is not able to tolerate chest pt. RT is going to cancel orders for PT

## 2013-12-06 NOTE — Progress Notes (Signed)
Subjective: She remains intubated and on a ventilator. No new problems noted.  Objective: Vital signs in last 24 hours: Temp:  [97.9 F (36.6 C)-100.1 F (37.8 C)] 99.6 F (37.6 C) (01/06 0400) Pulse Rate:  [48-119] 97 (01/06 0645) Resp:  [12-26] 24 (01/06 0630) BP: (122-185)/(60-128) 167/99 mmHg (01/06 0345) SpO2:  [80 %-100 %] 99 % (01/06 0645) FiO2 (%):  [40 %] 40 % (01/06 0658) Weight:  [62.3 kg (137 lb 5.6 oz)] 62.3 kg (137 lb 5.6 oz) (01/06 0500) Weight change:  Last BM Date: 12/05/13  Intake/Output from previous day: 01/05 0701 - 01/06 0700 In: 2683.4 [I.V.:266.6; NG/GT:1516.8; IV Piggyback:900] Out: 5000 [Urine:5000]  PHYSICAL EXAM General appearance: Intubated and sedated Resp: rhonchi bilaterally Cardio: regular rate and rhythm, S1, S2 normal, no murmur, click, rub or gallop GI: soft, non-tender; bowel sounds normal; no masses,  no organomegaly Extremities: extremities normal, atraumatic, no cyanosis or edema  Lab Results:    Basic Metabolic Panel:  Recent Labs  12/03/13 1720 12/04/13 0450 12/05/13 0419 12/06/13 0439  NA 141 143 147 145  K 3.1* 2.8* 2.9* 3.6*  CL 102 101 103 101  CO2 23 30 34* 34*  GLUCOSE 205* 296* 220* 124*  BUN 21 24* 33* 33*  CREATININE 0.72 0.74 0.66 0.66  CALCIUM 8.6 8.9 8.6 9.1  MG  --   --  2.0 2.0  PHOS  --  2.8  --   --    Liver Function Tests:  Recent Labs  12/04/13 0450  ALBUMIN 2.1*   No results found for this basename: LIPASE, AMYLASE,  in the last 72 hours No results found for this basename: AMMONIA,  in the last 72 hours CBC:  Recent Labs  12/03/13 1028 12/05/13 0419 12/06/13 0439  WBC 9.5 11.0* 13.8*  NEUTROABS 7.9*  --   --   HGB 9.7* 9.1* 9.8*  HCT 29.1* 26.8* 29.9*  MCV 85.8 85.9 87.2  PLT 315 371 458*   Cardiac Enzymes: No results found for this basename: CKTOTAL, CKMB, CKMBINDEX, TROPONINI,  in the last 72 hours BNP:  Recent Labs  12/04/13 0450  PROBNP 4082.0*   D-Dimer: No results  found for this basename: DDIMER,  in the last 72 hours CBG:  Recent Labs  12/05/13 1205 12/05/13 1700 12/05/13 1947 12/05/13 2354 12/06/13 0352 12/06/13 0746  GLUCAP 154* 166* 159* 123* 133* 130*   Hemoglobin A1C: No results found for this basename: HGBA1C,  in the last 72 hours Fasting Lipid Panel: No results found for this basename: CHOL, HDL, LDLCALC, TRIG, CHOLHDL, LDLDIRECT,  in the last 72 hours Thyroid Function Tests: No results found for this basename: TSH, T4TOTAL, FREET4, T3FREE, THYROIDAB,  in the last 72 hours Anemia Panel: No results found for this basename: VITAMINB12, FOLATE, FERRITIN, TIBC, IRON, RETICCTPCT,  in the last 72 hours Coagulation: No results found for this basename: LABPROT, INR,  in the last 72 hours Urine Drug Screen: Drugs of Abuse  No results found for this basename: labopia, cocainscrnur, labbenz, amphetmu, thcu, labbarb    Alcohol Level: No results found for this basename: ETH,  in the last 72 hours Urinalysis: No results found for this basename: COLORURINE, APPERANCEUR, LABSPEC, PHURINE, GLUCOSEU, HGBUR, BILIRUBINUR, KETONESUR, PROTEINUR, UROBILINOGEN, NITRITE, LEUKOCYTESUR,  in the last 72 hours Misc. Labs:  ABGS  Recent Labs  12/06/13 0500  PHART 7.499*  PO2ART 108.0*  TCO2 31.2  HCO3 34.2*   CULTURES Recent Results (from the past 240 hour(s))  CULTURE, BLOOD (ROUTINE  X 2)     Status: None   Collection Time    11/21/2013  2:34 PM      Result Value Range Status   Specimen Description BLOOD RIGHT ANTECUBITAL   Final   Special Requests     Final   Value: BOTTLES DRAWN AEROBIC AND ANAEROBIC AEB=12CC ANA=10CC   Culture NO GROWTH 5 DAYS   Final   Report Status 12/04/2013 FINAL   Final  CULTURE, BLOOD (ROUTINE X 2)     Status: None   Collection Time    11/21/2013  2:40 PM      Result Value Range Status   Specimen Description BLOOD RIGHT HAND   Final   Special Requests BOTTLES DRAWN AEROBIC AND ANAEROBIC 5CC   Final   Culture NO  GROWTH 5 DAYS   Final   Report Status 12/04/2013 FINAL   Final  MRSA PCR SCREENING     Status: Abnormal   Collection Time    11/04/2013  4:04 PM      Result Value Range Status   MRSA by PCR POSITIVE (*) NEGATIVE Final   Comment:            The GeneXpert MRSA Assay (FDA     approved for NASAL specimens     only), is one component of a     comprehensive MRSA colonization     surveillance program. It is not     intended to diagnose MRSA     infection nor to guide or     monitor treatment for     MRSA infections.     RESULT CALLED TO, READ BACK BY AND VERIFIED WITH:     COLLINS,B. AT 1906 ON 11/13/2013 BY BAUGHAM,M.  CULTURE, RESPIRATORY (NON-EXPECTORATED)     Status: None   Collection Time    11/30/13  3:59 PM      Result Value Range Status   Specimen Description TRACHEAL ASPIRATE   Final   Special Requests BLOODY SPECIMEN   Final   Gram Stain     Final   Value: ABUNDANT WBC PRESENT, PREDOMINANTLY PMN     RARE SQUAMOUS EPITHELIAL CELLS PRESENT     ABUNDANT GRAM POSITIVE COCCI IN PAIRS     IN CLUSTERS IN CHAINS     Performed at [facility]   Culture     Final   Value: ABUNDANT METHICILLIN RESISTANT STAPHYLOCOCCUS AUREUS     Note: RIFAMPIN AND GENTAMICIN SHOULD NOT BE USED AS SINGLE DRUGS FOR TREATMENT OF STAPH INFECTIONS. This organism is presumed to be Clindamycin resistant based on detection of inducible Clindamycin resistance. CRITICAL RESULT CALLED TO, READ BACK BY AND      VERIFIED WITH: Leeana Creer RN 135PM 12/03/13 GUSTK     Performed at [facility]   Report Status 12/03/2013 FINAL   Final   Organism ID, Bacteria METHICILLIN RESISTANT STAPHYLOCOCCUS AUREUS   Final  RESPIRATORY VIRUS PANEL     Status: Abnormal   Collection Time    11/30/13  6:00 PM      Result Value Range Status   Source - RVPAN NASOPHARYNGEAL SWAB   Corrected   Comment: CORRECTED ON 01/02 AT 2306: PREVIOUSLY REPORTED AS NASOPHARYNGEAL SWAB   Respiratory Syncytial Virus A NOT DETECTED   Final    Respiratory Syncytial Virus B NOT DETECTED   Final   Influenza A DETECTED (*)  Final   Influenza B NOT DETECTED   Final   Parainfluenza 1 NOT DETECTED  Final   Parainfluenza 2 NOT DETECTED   Final   Parainfluenza 3 NOT DETECTED   Final   Metapneumovirus NOT DETECTED   Final   Rhinovirus NOT DETECTED   Final   Adenovirus NOT DETECTED   Final   Influenza A H1 NOT DETECTED   Final   Influenza A H3 DETECTED (*)  Final   Comment: (NOTE)           Normal Reference Range for each Analyte: NOT DETECTED     Testing performed using the Luminex xTAG Respiratory Viral Panel test     kit.     This test was developed and its performance characteristics determined     by [facility]. It has not been cleared or approved by the [place]     Food and Drug Administration. This test is used for clinical purposes.     It should not be regarded as investigational or for research. This     laboratory is certified under the [place] (CLIA) as qualified to perform high complexity     clinical laboratory testing.     Performed at [facility]   Studies/Results: US Renal  12/04/2013   CLINICAL DATA:  Possible ureteropelvic junction stone.  EXAM: RENAL/URINARY TRACT ULTRASOUND COMPLETE  COMPARISON:  CT of the abdomen and pelvis 10/31/2013.  FINDINGS: Right Kidney:  Length: 10.4 cm. Echogenicity within normal limits. No mass or hydronephrosis visualized.  Left Kidney:  Length: 9.5 cm. Echogenicity within normal limits. No mass or hydronephrosis visualized.  Bladder:  Partially decompressed with a Foley balloon catheter in place. No mural abnormalities.  IMPRESSION: 1. No hydronephrosis. 2. No acute findings.   Electronically Signed   By: Vinnie Langton M.D.   On: 12/04/2013 13:04   Dg Chest Port 1 View  12/05/2013   CLINICAL DATA:  Respiratory failure.  EXAM: PORTABLE CHEST - 1 VIEW  COMPARISON:  12/04/2013.  FINDINGS: Endotracheal tube. And left central  line in stable position. NG tube tip below left hemidiaphragm. Previously identified pulmonary edema is again identified but is cleared partially. Small right pleural effusion. No pneumothorax. Stable cardiomegaly.  IMPRESSION: 1. Stable line and tube positioning. 2. Persistent bilateral pulmonary edema and right side pleural effusion. Pulmonary edema has cleared partially from prior exam .   Electronically Signed   By: [place]   On: 12/05/2013 07:55    Medications:  Scheduled: . ALPRAZolam  0.5 mg Oral TID WC   And  . ALPRAZolam  1 mg Oral QHS  . antiseptic oral rinse  15 mL Mouth Rinse QID  . chlorhexidine  15 mL Mouth Rinse BID  . docusate sodium  100 mg Oral BID  . feeding supplement (PRO-STAT SUGAR FREE 64)  30 mL Oral BID  . feeding supplement (VITAL AF 1.2 CAL)  1,000 mL Per Tube Q24H  . furosemide  40 mg Intravenous Daily  . heparin  5,000 Units Subcutaneous Q8H  . insulin aspart  0-20 Units Subcutaneous Q4H  . insulin detemir  10 Units Subcutaneous QHS  . ipratropium-albuterol  3 mL Nebulization Q6H  . methylPREDNISolone (SOLU-MEDROL) injection  40 mg Intravenous Q12H  . pantoprazole (PROTONIX) IV  40 mg Intravenous Daily  . potassium chloride  40 mEq Oral BID  . QUEtiapine  25 mg Oral BID  . sodium chloride  500 mL Intravenous Once  . sodium chloride  10-40 mL Intracatheter Q12H  . vancomycin  750  mg Intravenous Q12H   Continuous: . midazolam (VERSED) infusion 10 mg/hr (12/06/13 0600)   FHL:[id number] lactate, loperamide, midazolam, morphine injection, sodium chloride  Assesment: She was admitted with acute respiratory failure related to healthcare associated pneumonia and severe sepsis with shock. She remains intubated and weaning attempts have not been successful so far. She has elevated pro BNP and is being diuresed which seems to have helped. She has acute on chronic renal failure. Principal Problem:   Severe sepsis with septic shock Active Problems:    DCIS (ductal carcinoma in situ) of breast   Hypoadrenalism   Hypertension   Hyperlipidemia   Dementia   Metabolic encephalopathy   Acute on chronic kidney disease, stage 3   HCAP (healthcare-associated pneumonia)    Plan: Continue weaning attempts. She seems to have done better since she has been diuresed    LOS: 7 days   Carollynn Pennywell L 12/06/2013, 8:09 AM

## 2013-12-07 ENCOUNTER — Inpatient Hospital Stay (HOSPITAL_COMMUNITY): Payer: Medicare Other

## 2013-12-07 DIAGNOSIS — J441 Chronic obstructive pulmonary disease with (acute) exacerbation: Secondary | ICD-10-CM | POA: Diagnosis present

## 2013-12-07 DIAGNOSIS — J9600 Acute respiratory failure, unspecified whether with hypoxia or hypercapnia: Secondary | ICD-10-CM | POA: Diagnosis present

## 2013-12-07 DIAGNOSIS — R739 Hyperglycemia, unspecified: Secondary | ICD-10-CM | POA: Diagnosis not present

## 2013-12-07 DIAGNOSIS — T50905A Adverse effect of unspecified drugs, medicaments and biological substances, initial encounter: Secondary | ICD-10-CM | POA: Diagnosis not present

## 2013-12-07 DIAGNOSIS — J1100 Influenza due to unidentified influenza virus with unspecified type of pneumonia: Secondary | ICD-10-CM

## 2013-12-07 DIAGNOSIS — I5033 Acute on chronic diastolic (congestive) heart failure: Secondary | ICD-10-CM | POA: Diagnosis not present

## 2013-12-07 LAB — CBC
HCT: 31.1 % — ABNORMAL LOW (ref 36.0–46.0)
Hemoglobin: 10.4 g/dL — ABNORMAL LOW (ref 12.0–15.0)
MCH: 28.9 pg (ref 26.0–34.0)
MCHC: 33.4 g/dL (ref 30.0–36.0)
MCV: 86.4 fL (ref 78.0–100.0)
PLATELETS: 468 10*3/uL — AB (ref 150–400)
RBC: 3.6 MIL/uL — ABNORMAL LOW (ref 3.87–5.11)
RDW: 14.7 % (ref 11.5–15.5)
WBC: 19.1 10*3/uL — ABNORMAL HIGH (ref 4.0–10.5)

## 2013-12-07 LAB — BLOOD GAS, ARTERIAL
ACID-BASE EXCESS: 6 mmol/L — AB (ref 0.0–2.0)
BICARBONATE: 29.5 meq/L — AB (ref 20.0–24.0)
DRAWN BY: 38235
FIO2: 0.4 %
MECHVT: 450 mL
O2 Saturation: 98 %
PCO2 ART: 39.3 mmHg (ref 35.0–45.0)
PEEP: 5 cmH2O
PO2 ART: 110 mmHg — AB (ref 80.0–100.0)
Patient temperature: 37
RATE: 12 resp/min
TCO2: 26.4 mmol/L (ref 0–100)
pH, Arterial: 7.488 — ABNORMAL HIGH (ref 7.350–7.450)

## 2013-12-07 LAB — URINE MICROSCOPIC-ADD ON

## 2013-12-07 LAB — URINALYSIS, ROUTINE W REFLEX MICROSCOPIC
BILIRUBIN URINE: NEGATIVE
GLUCOSE, UA: NEGATIVE mg/dL
Ketones, ur: NEGATIVE mg/dL
Leukocytes, UA: NEGATIVE
Nitrite: NEGATIVE
PROTEIN: NEGATIVE mg/dL
Specific Gravity, Urine: 1.015 (ref 1.005–1.030)
Urobilinogen, UA: 0.2 mg/dL (ref 0.0–1.0)
pH: 7 (ref 5.0–8.0)

## 2013-12-07 LAB — BASIC METABOLIC PANEL
BUN: 39 mg/dL — ABNORMAL HIGH (ref 6–23)
CALCIUM: 9.2 mg/dL (ref 8.4–10.5)
CO2: 30 mEq/L (ref 19–32)
Chloride: 102 mEq/L (ref 96–112)
Creatinine, Ser: 0.77 mg/dL (ref 0.50–1.10)
GFR, EST AFRICAN AMERICAN: 87 mL/min — AB (ref >90)
GFR, EST NON AFRICAN AMERICAN: 75 mL/min — AB (ref >90)
Glucose, Bld: 204 mg/dL — ABNORMAL HIGH (ref 70–99)
Potassium: 4.2 mEq/L (ref 3.7–5.3)
SODIUM: 144 meq/L (ref 137–147)

## 2013-12-07 LAB — CLOSTRIDIUM DIFFICILE BY PCR: CDIFFPCR: NEGATIVE

## 2013-12-07 LAB — GLUCOSE, CAPILLARY
GLUCOSE-CAPILLARY: 166 mg/dL — AB (ref 70–99)
Glucose-Capillary: 153 mg/dL — ABNORMAL HIGH (ref 70–99)
Glucose-Capillary: 164 mg/dL — ABNORMAL HIGH (ref 70–99)
Glucose-Capillary: 189 mg/dL — ABNORMAL HIGH (ref 70–99)
Glucose-Capillary: 126 mg/dL — ABNORMAL HIGH (ref 70–99)

## 2013-12-07 LAB — VANCOMYCIN, TROUGH: Vancomycin Tr: 10.9 ug/mL (ref 10.0–20.0)

## 2013-12-07 LAB — MAGNESIUM: MAGNESIUM: 2.2 mg/dL (ref 1.5–2.5)

## 2013-12-07 MED ORDER — INSULIN DETEMIR 100 UNIT/ML ~~LOC~~ SOLN
15.0000 [IU] | Freq: Every day | SUBCUTANEOUS | Status: DC
  Administered 2013-12-07 – 2013-12-08 (×2): 15 [IU] via SUBCUTANEOUS
  Filled 2013-12-07 (×2): qty 0.15

## 2013-12-07 MED ORDER — VANCOMYCIN HCL IN DEXTROSE 1-5 GM/200ML-% IV SOLN
1000.0000 mg | Freq: Two times a day (BID) | INTRAVENOUS | Status: DC
  Administered 2013-12-07 – 2013-12-10 (×7): 1000 mg via INTRAVENOUS
  Filled 2013-12-07 (×10): qty 200

## 2013-12-07 NOTE — Progress Notes (Signed)
Pt was extubated per Dr. Luan Pulling. The family doesn't want the Pt re intubated. Pt extubated to a 50% VM and RT will decrease FIO2 to keep SATs greater than 88%hoarseness 65, BP 109/52. SATS 97. Pt is stable and son is at bedside.

## 2013-12-07 NOTE — Progress Notes (Signed)
[facility] PROGRESS NOTE  Bonnie [name] ZOX:[id number] DOB: May 06, 1927 DOA: 11/25/2013 PCP: Glo Herring., MD    Code Status: Now  DO NOT RESUSCITATE per Dr. Luan Pulling conversation with the family 12/07/2013. Family Communication: Pending Disposition Plan: To be determined   Consultants:  Pulmonology  Procedures: CT Chest + abdomen pelvis 12/31  ET tube 12/31 , 22 cm  right subclavian line 12/31  Art line 12/31  NG tube 12/31  Renal ultrasound on 03/20/2014 Echocardiogram 12/04/2013:Study Conclusions- - Left ventricle: The cavity size was normal. Wall thickness was increased in a pattern of mild LVH. Systolic function was normal. The estimated ejection fraction was in the range of 55% to 60%. Images were inadequate for LV wall motion assessment. Difficult to exclude basal inferolateral hypokinesis, but many images are off axis. Doppler parameters are consistent with abnormal left ventricular relaxation (grade 1 diastolic dysfunction). - Aortic valve: Mildly calcified annulus. Trileaflet; mildly calcified leaflets. No significant regurgitation. - Mitral valve: Calcified annulus. Trivial regurgitation. - Right atrium: Central venous pressure: 43mm Hg (est). - Tricuspid valve: Trivial regurgitation. - Pulmonary arteries: Systolic pressure could not be accurately estimated. - Pericardium, extracardiac: There was no pericardial effusion. Impressions:  - Mild LVH with LVEF 55-60%, limited wall motion assessment - see above. Grade 1 diastolic dysfunction. MAC with trivial mitral regurgitation. Unable to assess PASP. No pericardial effusion.   Antibiotics:  1. Vancomycin 11/30/2013- 2. Tamiflu 11/30/13-12/04/2013 3. Zosyn 12/01/2013-12/04/2013 4. Cefepime 11/02/2013-10/31/2013.    HPI/Subjective: The patient is sedated on the ventilator. No acute changes per nursing.  Objective: Filed Vitals:   12/07/13 0730  BP:   Pulse:   Temp: 98.9 F (37.2 C)  Resp:     temperature maximum 101.6. Temperature current 98.9. Blood pressure 106/68. Oxygen saturation 99%.    Intake/Output Summary (Last 24 hours) at 12/07/13 0923 Last data filed at 12/07/13 0500  Gross per 24 hour  Intake 1459.05 ml  Output   4400 ml  Net -2940.95 ml   Filed Weights   12/04/13 0630 12/06/13 0500 12/07/13 0500  Weight: 64.1 kg (141 lb 5 oz) 62.3 kg (137 lb 5.6 oz) 61.9 kg (136 lb 7.4 oz)    Exam:   General: Debilitated elderly 80 year old woman on the ventilator, in no acute distress.  Cardiovascular: Distant S1, S2, with no audible murmurs rubs or gallops.  Respiratory: Bilateral rhonchi.  Abdomen: Positive bowel sounds, soft, nontender, nondistended.  Rectal tube with minimal brown stool in the tube.  Musculoskeletal: No acute hot red joints. No pedal edema.  Neurologic: She is sedated on the ventilator. As the sedation is being decreased, she is moving all of her extremities mildly.  Data Reviewed: Basic Metabolic Panel:  Recent Labs Lab 12/02/13 0421 12/03/13 8119  12/03/13 1720 12/04/13 0450 12/05/13 0419 12/06/13 0439 12/07/13 0415  NA 140  --   < > 141 143 147 145 144  K 3.3*  --   < > 3.1* 2.8* 2.9* 3.6* 4.2  CL 107  --   < > 102 101 103 101 102  CO2 23  --   < > 23 30 34* 34* 30  GLUCOSE 109*  --   < > 205* 296* 220* 124* 204*  BUN 22  --   < > 21 24* 33* 33* 39*  CREATININE 0.77  --   < > 0.72 0.74 0.66 0.66 0.77  CALCIUM 8.3*  --   < > 8.6 8.9 8.6 9.1 9.2  MG  --  1.7  --   --   --  2.0 2.0 2.2  PHOS  --   --   --   --  2.8  --   --   --   < > = values in this interval not displayed. Liver Function Tests:  Recent Labs Lab 12/01/13 0440 12/01/13 1259 12/02/13 0421 12/04/13 0450  AST $Re'16 14 10  'NQS$ --   ALT $Re'11 11 9  'daR$ --   ALKPHOS 55 52 49  --   BILITOT 0.5 0.5 0.6  --   PROT 5.5* 5.4* 5.0*  --   ALBUMIN 2.3* 2.3* 1.9* 2.1*   No results found for this basename: LIPASE, AMYLASE,  in the last 168 hours No results found for this  basename: AMMONIA,  in the last 168 hours CBC:  Recent Labs Lab 12/01/13 0440 12/02/13 0421 12/03/13 0513 12/03/13 1028 12/05/13 0419 12/06/13 0439 12/07/13 0415  WBC 6.0 5.2 7.2 9.5 11.0* 13.8* 19.1*  NEUTROABS 5.3 4.4  --  7.9*  --   --   --   HGB 10.0* 8.7* 8.9* 9.7* 9.1* 9.8* 10.4*  HCT 29.7* 26.4* 26.6* 29.1* 26.8* 29.9* 31.1*  MCV 86.6 87.1 85.8 85.8 85.9 87.2 86.4  PLT 200 216 279 315 371 458* 468*   Cardiac Enzymes: No results found for this basename: CKTOTAL, CKMB, CKMBINDEX, TROPONINI,  in the last 168 hours BNP (last 3 results)  Recent Labs  10/31/2013 1233 12/04/13 0450  PROBNP 451.7* 4082.0*   CBG:  Recent Labs Lab 12/06/13 1627 12/06/13 2003 12/06/13 2339 12/07/13 0347 12/07/13 0737  GLUCAP 172* 165* 208* 153* 164*    Recent Results (from the past 240 hour(s))  CULTURE, BLOOD (ROUTINE X 2)     Status: None   Collection Time    11/26/2013  2:34 PM      Result Value Range Status   Specimen Description BLOOD RIGHT ANTECUBITAL   Final   Special Requests     Final   Value: BOTTLES DRAWN AEROBIC AND ANAEROBIC AEB=12CC ANA=10CC   Culture NO GROWTH 5 DAYS   Final   Report Status 12/04/2013 FINAL   Final  CULTURE, BLOOD (ROUTINE X 2)     Status: None   Collection Time    11/30/2013  2:40 PM      Result Value Range Status   Specimen Description BLOOD RIGHT HAND   Final   Special Requests BOTTLES DRAWN AEROBIC AND ANAEROBIC 5CC   Final   Culture NO GROWTH 5 DAYS   Final   Report Status 12/04/2013 FINAL   Final  MRSA PCR SCREENING     Status: Abnormal   Collection Time    11/10/2013  4:04 PM      Result Value Range Status   MRSA by PCR POSITIVE (*) NEGATIVE Final   Comment:            The GeneXpert MRSA Assay (FDA     approved for NASAL specimens     only), is one component of a     comprehensive MRSA colonization     surveillance program. It is not     intended to diagnose MRSA     infection nor to guide or     monitor treatment for     MRSA  infections.     RESULT CALLED TO, READ BACK BY AND VERIFIED WITH:     COLLINS,B. AT 1906 ON 11/08/2013 BY BAUGHAM,M.  CULTURE, RESPIRATORY (NON-EXPECTORATED)     Status: None   Collection Time    11/30/13  3:59  PM      Result Value Range Status   Specimen Description TRACHEAL ASPIRATE   Final   Special Requests BLOODY SPECIMEN   Final   Gram Stain     Final   Value: ABUNDANT WBC PRESENT, PREDOMINANTLY PMN     RARE SQUAMOUS EPITHELIAL CELLS PRESENT     ABUNDANT GRAM POSITIVE COCCI IN PAIRS     IN CLUSTERS IN CHAINS     Performed at [facility]   Culture     Final   Value: ABUNDANT METHICILLIN RESISTANT STAPHYLOCOCCUS AUREUS     Note: RIFAMPIN AND GENTAMICIN SHOULD NOT BE USED AS SINGLE DRUGS FOR TREATMENT OF STAPH INFECTIONS. This organism is presumed to be Clindamycin resistant based on detection of inducible Clindamycin resistance. CRITICAL RESULT CALLED TO, READ BACK BY AND      VERIFIED WITH: HAWKINS RN 135PM 12/03/13 GUSTK     Performed at [facility]   Report Status 12/03/2013 FINAL   Final   Organism ID, Bacteria METHICILLIN RESISTANT STAPHYLOCOCCUS AUREUS   Final  RESPIRATORY VIRUS PANEL     Status: Abnormal   Collection Time    11/30/13  6:00 PM      Result Value Range Status   Source - RVPAN NASOPHARYNGEAL SWAB   Corrected   Comment: CORRECTED ON 01/02 AT 2306: PREVIOUSLY REPORTED AS NASOPHARYNGEAL SWAB   Respiratory Syncytial Virus A NOT DETECTED   Final   Respiratory Syncytial Virus B NOT DETECTED   Final   Influenza A DETECTED (*)  Final   Influenza B NOT DETECTED   Final   Parainfluenza 1 NOT DETECTED   Final   Parainfluenza 2 NOT DETECTED   Final   Parainfluenza 3 NOT DETECTED   Final   Metapneumovirus NOT DETECTED   Final   Rhinovirus NOT DETECTED   Final   Adenovirus NOT DETECTED   Final   Influenza A H1 NOT DETECTED   Final   Influenza A H3 DETECTED (*)  Final   Comment: (NOTE)           Normal Reference Range for each Analyte: NOT DETECTED      Testing performed using the Luminex xTAG Respiratory Viral Panel test     kit.     This test was developed and its performance characteristics determined     by [facility]. It has not been cleared or approved by the [place]     Food and Drug Administration. This test is used for clinical purposes.     It should not be regarded as investigational or for research. This     laboratory is certified under the [place] (CLIA) as qualified to perform high complexity     clinical laboratory testing.     Performed at [facility] DIFFICILE BY PCR     Status: None   Collection Time    12/06/13 11:15 AM      Result Value Range Status   C difficile by pcr NEGATIVE  NEGATIVE Final     Studies: Dg Chest Port 1 View  12/06/2013   CLINICAL DATA:  Respiratory failure  EXAM: PORTABLE CHEST - 1 VIEW  COMPARISON:  DG CHEST 1V PORT dated 12/05/2013; CT CHEST W/CM dated 11/30/2013; DG CHEST 1V PORT dated 12/04/2013  FINDINGS: Endotracheal tube tip is 1.7 cm above the carina. Left subclavian line tip is at the upper margin of the SVC. Nasogastric tube  extends down into the stomach.  Progressive peripheral right upper lobe airspace opacity. Mildly improved aeration at the lung bases, with suspected layering pleural effusion at least on the right. Bilateral increased interstitial accentuation.  Tortuous thoracic aorta with associated atherosclerotic calcification.  IMPRESSION: 1. Increased airspace opacity peripherally in the right upper lobe. Layering right pleural effusion with bilateral interstitial edema. Slightly improved aeration of both lung bases. 2. Stable appearance of the support apparatus.   Electronically Signed   By: Sherryl Barters M.D.   On: 12/06/2013 12:11    Scheduled Meds: . ALPRAZolam  0.5 mg Oral TID WC   And  . ALPRAZolam  1 mg Oral QHS  . amLODipine  10 mg Oral Daily  . antiseptic oral rinse  15 mL Mouth Rinse QID  .  chlorhexidine  15 mL Mouth Rinse BID  . docusate sodium  100 mg Oral BID  . feeding supplement (PRO-STAT SUGAR FREE 64)  30 mL Oral BID  . feeding supplement (VITAL AF 1.2 CAL)  1,000 mL Per Tube Q24H  . furosemide  40 mg Intravenous Daily  . heparin  5,000 Units Subcutaneous Q8H  . insulin aspart  0-20 Units Subcutaneous Q4H  . insulin detemir  10 Units Subcutaneous QHS  . ipratropium-albuterol  3 mL Nebulization Q6H  . methylPREDNISolone (SOLU-MEDROL) injection  40 mg Intravenous Q12H  . pantoprazole (PROTONIX) IV  40 mg Intravenous Daily  . potassium chloride  40 mEq Oral BID  . QUEtiapine  25 mg Oral BID  . sodium chloride  500 mL Intravenous Once  . sodium chloride  10-40 mL Intracatheter Q12H  . vancomycin  750 mg Intravenous Q12H   Continuous Infusions: . midazolam (VERSED) infusion Stopped (12/07/13 0843)    Assessment:  Principal Problem:   Influenza with pneumonia Active Problems:   HCAP (healthcare-associated pneumonia)   Severe sepsis with septic shock   COPD with exacerbation   Acute respiratory failure   Acute on chronic diastolic heart failure   DCIS (ductal carcinoma in situ) of breast   Hypoadrenalism   Hypertension   Hyperlipidemia   Dementia   Metabolic encephalopathy   Acute on chronic kidney disease, stage 3   Hyperglycemia, drug-induced  1. Acute respiratory failure secondary to influenza A with pneumonia/MRSA pneumonia/HCAP. Status post intubation. The plan is to extubate the patient today per my conversation with Dr. Luan Pulling. Dr. Luan Pulling discussed CODE STATUS and the possibility that the patient may not do well after extubation with her family. Per his discussion, if the patient does not tolerate extubation, she will be made a comfort care. She is status post several antibiotics including Zosyn, cefepime, and vancomycin. She maintains on vancomycin. Tamiflu course has been completed.  Possible COPD with exacerbation. She is on gentle steroids per Dr.  Luan Pulling. We'll wean as appropriate. We'll continue nebulizers.  Probable acute on chronic diastolic heart failure. Chest x-ray on 12/06/2013 revealed layering right pleural effusion and bilateral interstitial edema. Echo revealed ejection fraction of 55-60%. IV Lasix was started on 12/06/2012. She is diuresed over 2 L.  Resolving sepsis. The patient's white blood cell count has increased to 19,000 and she ran a fever overnight. If her white blood cell count continues to increase or if she continues to be febrile, we will check another round of blood cultures, check C. difficile PCR again, and broaden antibiotic therapy.  Steroid-induced hyperglycemia. Her tablet a blood glucose is trending up. She is on IV Solu-Medrol and tube feedings. Will increase insulin.  Previous history of diarrhea. C. difficile PCR negative earlier in the hospitalization.  Dementia with agitation/encephalopathy. We'll continue Seroquel.      Plan: 1. Extubation plan today with no intent to reintubate. Recommendations per Dr. Luan Pulling. 2. Increase Levimir from 10 units to 15 units each bedtime. 3. If the patient spikes another fever, will get blood cultures x2. For now, will check another C. difficile PCR and another urinalysis. Low threshold for expanding antibiotic therapy. 4. Considered decreasing IV Lasix in the morning.  Time spent: Critical care time 45 minutes.    [facility] Pager [phone]. If 7PM-7AM, please contact night-coverage at [email or web address], password [facility] 12/07/2013, 9:23 AM  LOS: 8 days

## 2013-12-07 NOTE — Progress Notes (Signed)
She looks the best that she has been since I have seen her. She is undergoing weaning and has tolerated it so far. She is not agitated. She has not had elevated heart rate. She has been able to maintain volumes. I discussed her situation at length by phone with her son Margarita Grizzle. He has discussed it with his brother and both agree that we should attempt extubation but not reintubate. I think that is appropriate. Her son Margarita Grizzle wants to be here so I will hold on extubation until he arrives. DO NOT RESUSCITATE order was written

## 2013-12-07 NOTE — Clinical Social Work Note (Signed)
Pt extubated today. Will follow up with d/c planning when appropriate. Per MD notes, plan is to consider comfort care if pt does not tolerate extubation.   Benay Pike, [place]

## 2013-12-07 NOTE — Progress Notes (Signed)
[place] for Vancomycin Indication: Pneumonia  Allergies  Allergen Reactions  . Macrodantin Nausea Only    It also causes fever   Patient Measurements: Height: _0  (157.5 cm) Weight: 136 lb 7.4 oz (61.9 kg) IBW/kg (Calculated) : 50.1  Vital Signs: Temp: 98.7 F (37.1 C) (01/07 1135) Temp src: Axillary (01/07 1135) BP: 136/58 mmHg (01/07 1200) Pulse Rate: 104 (01/07 1200) Intake/Output from previous day: 01/06 0701 - 01/07 0700 In: 1539.1 [I.V.:189.1; NG/GT:1050; IV Piggyback:300] Out: 4400 [Urine:4400] Intake/Output from this shift:    Labs:  Recent Labs  12/05/13 0419 12/06/13 0439 12/07/13 0415  WBC 11.0* 13.8* 19.1*  HGB 9.1* 9.8* 10.4*  PLT 371 458* 468*  CREATININE 0.66 0.66 0.77   Estimated Creatinine Clearance: 45.3 ml/min (by C-G formula based on Cr of 0.77).  Recent Labs  12/07/13 0918  VANCOTROUGH 10.9    Microbiology: Recent Results (from the past 720 hour(s))  CULTURE, BLOOD (ROUTINE X 2)     Status: None   Collection Time    11/09/2013  2:34 PM      Result Value Range Status   Specimen Description BLOOD RIGHT ANTECUBITAL   Final   Special Requests     Final   Value: BOTTLES DRAWN AEROBIC AND ANAEROBIC AEB=12CC ANA=10CC   Culture NO GROWTH 5 DAYS   Final   Report Status 12/04/2013 FINAL   Final  CULTURE, BLOOD (ROUTINE X 2)     Status: None   Collection Time    11/23/2013  2:40 PM      Result Value Range Status   Specimen Description BLOOD RIGHT HAND   Final   Special Requests BOTTLES DRAWN AEROBIC AND ANAEROBIC 5CC   Final   Culture NO GROWTH 5 DAYS   Final   Report Status 12/04/2013 FINAL   Final  MRSA PCR SCREENING     Status: Abnormal   Collection Time    11/08/2013  4:04 PM      Result Value Range Status   MRSA by PCR POSITIVE (*) NEGATIVE Final   Comment:            The GeneXpert MRSA Assay (FDA     approved for NASAL specimens     only), is one component of a     comprehensive MRSA colonization      surveillance program. It is not     intended to diagnose MRSA     infection nor to guide or     monitor treatment for     MRSA infections.     RESULT CALLED TO, READ BACK BY AND VERIFIED WITH:     COLLINS,B. AT 1906 ON 11/02/2013 BY BAUGHAM,M.  CULTURE, RESPIRATORY (NON-EXPECTORATED)     Status: None   Collection Time    11/30/13  3:59 PM      Result Value Range Status   Specimen Description TRACHEAL ASPIRATE   Final   Special Requests BLOODY SPECIMEN   Final   Gram Stain     Final   Value: ABUNDANT WBC PRESENT, PREDOMINANTLY PMN     RARE SQUAMOUS EPITHELIAL CELLS PRESENT     ABUNDANT GRAM POSITIVE COCCI IN PAIRS     IN CLUSTERS IN CHAINS     Performed at [facility]   Culture     Final   Value: ABUNDANT METHICILLIN RESISTANT STAPHYLOCOCCUS AUREUS     Note: RIFAMPIN AND GENTAMICIN SHOULD NOT BE USED AS SINGLE DRUGS FOR TREATMENT OF STAPH  INFECTIONS. This organism is presumed to be Clindamycin resistant based on detection of inducible Clindamycin resistance. CRITICAL RESULT CALLED TO, READ BACK BY AND      VERIFIED WITH: HAWKINS RN 135PM 12/03/13 GUSTK     Performed at [facility]   Report Status 12/03/2013 FINAL   Final   Organism ID, Bacteria METHICILLIN RESISTANT STAPHYLOCOCCUS AUREUS   Final  RESPIRATORY VIRUS PANEL     Status: Abnormal   Collection Time    11/30/13  6:00 PM      Result Value Range Status   Source - RVPAN NASOPHARYNGEAL SWAB   Corrected   Comment: CORRECTED ON 01/02 AT 2306: PREVIOUSLY REPORTED AS NASOPHARYNGEAL SWAB   Respiratory Syncytial Virus A NOT DETECTED   Final   Respiratory Syncytial Virus B NOT DETECTED   Final   Influenza A DETECTED (*)  Final   Influenza B NOT DETECTED   Final   Parainfluenza 1 NOT DETECTED   Final   Parainfluenza 2 NOT DETECTED   Final   Parainfluenza 3 NOT DETECTED   Final   Metapneumovirus NOT DETECTED   Final   Rhinovirus NOT DETECTED   Final   Adenovirus NOT DETECTED   Final   Influenza A H1 NOT  DETECTED   Final   Influenza A H3 DETECTED (*)  Final   Comment: (NOTE)           Normal Reference Range for each Analyte: NOT DETECTED     Testing performed using the Luminex xTAG Respiratory Viral Panel test     kit.     This test was developed and its performance characteristics determined     by [facility]. It has not been cleared or approved by the [place]     Food and Drug Administration. This test is used for clinical purposes.     It should not be regarded as investigational or for research. This     laboratory is certified under the [place] (CLIA) as qualified to perform high complexity     clinical laboratory testing.     Performed at [facility] DIFFICILE BY PCR     Status: None   Collection Time    12/06/13 11:15 AM      Result Value Range Status   C difficile by pcr NEGATIVE  NEGATIVE Final   Medical History: Past Medical History  Diagnosis Date  . GERD (gastroesophageal reflux disease)   . Colitis, ischemic   . Osteoarthritis   . Breast cancer     Right Breast - May 2013/ Left Breast - 2003  . Anxiety   . Stress incontinence, female   . Constipation - functional   . Hypertension   . Headache(784.0)   . Hx: UTI (urinary tract infection)   . HOH (hard of hearing)     both ears; refuses to wear hearing aides  . Dementia   . Benign essential tremor     left hand; intermittent  . Hyperlipidemia   . Adrenal gland hypofunction     takes cortisone daily  . S/P radiation therapy 04/15/12 -05/13/12    RIGHT BREAST  . Chronic constipation 01/04/13  . Fatigue 02/25/13    chronic  . Collar bone fracture   . Depression   . Glucocorticoid deficiency    Medications:  Scheduled:  . ALPRAZolam  0.5 mg Oral TID WC   And  . ALPRAZolam  1 mg  Oral QHS  . amLODipine  10 mg Oral Daily  . antiseptic oral rinse  15 mL Mouth Rinse QID  . chlorhexidine  15 mL Mouth Rinse BID  . docusate sodium  100 mg  Oral BID  . feeding supplement (PRO-STAT SUGAR FREE 64)  30 mL Oral BID  . feeding supplement (VITAL AF 1.2 CAL)  1,000 mL Per Tube Q24H  . furosemide  40 mg Intravenous Daily  . heparin  5,000 Units Subcutaneous Q8H  . insulin aspart  0-20 Units Subcutaneous Q4H  . insulin detemir  15 Units Subcutaneous QHS  . ipratropium-albuterol  3 mL Nebulization Q6H  . methylPREDNISolone (SOLU-MEDROL) injection  40 mg Intravenous Q12H  . pantoprazole (PROTONIX) IV  40 mg Intravenous Daily  . potassium chloride  40 mEq Oral BID  . QUEtiapine  25 mg Oral BID  . sodium chloride  500 mL Intravenous Once  . sodium chloride  10-40 mL Intracatheter Q12H  . vancomycin  1,000 mg Intravenous Q12H   Assessment: 80 yo F admitted from NH with flu-like symptoms.  CXR + PNA.  Renal function is stable.  Currently afebrile.   Estimated Creatinine Clearance: 45.3 ml/min (by C-G formula based on Cr of 0.77).  Trough remains below goal.  Cefepime 12/30>> 1/1 Vancomycin 12/30>> Zosyn 1/1 >> 1/4  Goal of Therapy:  Vancomycin trough level 15-20 mcg/ml  Plan:  Increase Vancomycin to 1000 mg IV q12h Re-check trough at steady state & weekly as clinically indicated Monitor renal function and cx data   Pricilla Larsson 12/07/2013,1:24 PM

## 2013-12-07 NOTE — Procedures (Signed)
Extubation Procedure Note  Patient Details:   Name: Bonnie [name] DOB: November 09, 1927 MRN: [id number]   Airway Documentation:  Airway (Active)  Secured at (cm) 21 cm 12/07/2013  7:22 AM  Measured From Lips 12/07/2013  [place] 12/07/2013  7:22 AM  Secured By [employer] 12/07/2013  7:22 AM  Tube Holder Repositioned Yes 12/07/2013  7:22 AM  Cuff Pressure (cm H2O) 23 cm H2O 12/07/2013  7:22 AM  Site Condition Dry 12/07/2013  7:22 AM    Evaluation  O2 sats: stable throughout Complications: No apparent complications Patient did tolerate procedure well. Bilateral Breath Sounds: Diminished Suctioning: Oral;Airway Yes  Elsie Stain 12/07/2013, 9:03 AM

## 2013-12-08 DIAGNOSIS — R6521 Severe sepsis with septic shock: Secondary | ICD-10-CM

## 2013-12-08 DIAGNOSIS — A419 Sepsis, unspecified organism: Principal | ICD-10-CM

## 2013-12-08 DIAGNOSIS — J189 Pneumonia, unspecified organism: Secondary | ICD-10-CM

## 2013-12-08 DIAGNOSIS — R6520 Severe sepsis without septic shock: Secondary | ICD-10-CM

## 2013-12-08 LAB — GLUCOSE, CAPILLARY
GLUCOSE-CAPILLARY: 141 mg/dL — AB (ref 70–99)
Glucose-Capillary: 172 mg/dL — ABNORMAL HIGH (ref 70–99)
Glucose-Capillary: 117 mg/dL — ABNORMAL HIGH (ref 70–99)
Glucose-Capillary: 228 mg/dL — ABNORMAL HIGH (ref 70–99)
Glucose-Capillary: 98 mg/dL (ref 70–99)
Glucose-Capillary: 129 mg/dL — ABNORMAL HIGH (ref 70–99)

## 2013-12-08 LAB — CBC
HCT: 36.8 % (ref 36.0–46.0)
Hemoglobin: 12.2 g/dL (ref 12.0–15.0)
MCH: 28.7 pg (ref 26.0–34.0)
MCHC: 33.2 g/dL (ref 30.0–36.0)
MCV: 86.6 fL (ref 78.0–100.0)
PLATELETS: 575 10*3/uL — AB (ref 150–400)
RBC: 4.25 MIL/uL (ref 3.87–5.11)
RDW: 14.9 % (ref 11.5–15.5)
WBC: 27.4 10*3/uL — AB (ref 4.0–10.5)

## 2013-12-08 LAB — BASIC METABOLIC PANEL
BUN: 41 mg/dL — ABNORMAL HIGH (ref 6–23)
CHLORIDE: 102 meq/L (ref 96–112)
CO2: 30 mEq/L (ref 19–32)
CREATININE: 0.82 mg/dL (ref 0.50–1.10)
Calcium: 9.5 mg/dL (ref 8.4–10.5)
GFR, EST AFRICAN AMERICAN: 74 mL/min — AB (ref >90)
GFR, EST NON AFRICAN AMERICAN: 64 mL/min — AB (ref >90)
Glucose, Bld: 116 mg/dL — ABNORMAL HIGH (ref 70–99)
Potassium: 3.6 mEq/L — ABNORMAL LOW (ref 3.7–5.3)
Sodium: 147 mEq/L (ref 137–147)

## 2013-12-08 LAB — MAGNESIUM: MAGNESIUM: 2.5 mg/dL (ref 1.5–2.5)

## 2013-12-08 MED ORDER — SODIUM CHLORIDE 0.9 % IV SOLN: INTRAVENOUS | Status: DC

## 2013-12-08 MED ORDER — LORAZEPAM 2 MG/ML IJ SOLN
1.0000 mg | INTRAMUSCULAR | Status: DC | PRN
  Administered 2013-12-08 – 2013-12-11 (×6): 1 mg via INTRAVENOUS
  Filled 2013-12-08 (×6): qty 1

## 2013-12-08 MED ORDER — POTASSIUM CHLORIDE 10 MEQ/100ML IV SOLN
10.0000 meq | INTRAVENOUS | Status: AC
  Administered 2013-12-08 (×4): 10 meq via INTRAVENOUS
  Filled 2013-12-08 (×4): qty 100

## 2013-12-08 NOTE — Evaluation (Signed)
Clinical/Bedside Swallow Evaluation  Patient Details  Name: Bonnie [name] MRN: [id number] Date of Birth: [birth year]  Today's Date: 12/08/2013 Time: 9562-1308 SLP Time Calculation (min): 40 min  Past Medical History:  Past Medical History  Diagnosis Date  . GERD (gastroesophageal reflux disease)   . Colitis, ischemic   . Osteoarthritis   . Breast cancer     Right Breast - May 2013/ Left Breast - 2003  . Anxiety   . Stress incontinence, female   . Constipation - functional   . Hypertension   . Headache(784.0)   . Hx: UTI (urinary tract infection)   . HOH (hard of hearing)     both ears; refuses to wear hearing aides  . Dementia   . Benign essential tremor     left hand; intermittent  . Hyperlipidemia   . Adrenal gland hypofunction     takes cortisone daily  . S/P radiation therapy 04/15/12 -05/13/12    RIGHT BREAST  . Chronic constipation 01/04/13  . Fatigue 02/25/13    chronic  . Collar bone fracture   . Depression   . Glucocorticoid deficiency    Past Surgical History:  Past Surgical History  Procedure Laterality Date  . Tonsillectomy      At age 20  . Lumbar laminectomy    . Abdominal hysterectomy    . Bunionectomy      bilateral  . Rotor cuff      Right Shoulder  . Upper gastrointestinal endoscopy    . Kidney stone    . Knee surgery  1987    L  . Foot surgery      left foot 1994, right foot 1995  . Lumpectomy  2003  . Eye surgery  2005  . Back surgery    . Breast surgery  2003    left breast  . Tonsillectomy    . Breast surgery  2013   HPI:  Bonnie [name] is an 80 yo female admitted from the [place] last week with acute respiratory failure requiring endotracheal intubation and mechanical ventilation secondary to influenza A and MRSA pneumonia. Pt was extubated yesterday.   Assessment / Plan / Recommendation Clinical Impression  Ms. Lacuesta was able to follow simple commands and responded appropriately to personal questions. She is weak and  demonstrates shallow breathing via vent-mask, but agreeable to limited po trials. Pt demonstrates po readiness skills and immediately takes ice chip/ice cream from spoon, but has a delay in swallow initiation. Pt closes her eyes frequently and appears exhausted. She was able to tolerate 1/4 tsp presentations ice cream and apple sauce  with delayed cough on 50% of trials. Dysphagia likely related to weakness and decreased respiratory support. Recommend ice chip presentation after oral care for comfort. SLP will reassess pt tomorrow to determine needs. Above to Dr. Sarajane Jews    Aspiration Risk  Moderate    Diet Recommendation NPO;Ice chips PRN after oral care   Medication Administration: Via alternative means    Other  Recommendations Oral Care Recommendations: Oral care Q4 per protocol   Follow Up Recommendations  24 hour supervision/assistance    Frequency and Duration              Swallow Study Prior Functional Status       General Date of Onset: 11/16/2013 HPI: Bonnie [name] is an 80 yo female admitted from the [place] last week with acute respiratory failure requiring endotracheal intubation and mechanical ventilation secondary to influenza A and MRSA pneumonia.  Pt was extubated yesterday. Type of Study: Bedside swallow evaluation Previous Swallow Assessment: None on record Diet Prior to this Study: NPO Temperature Spikes Noted: No Respiratory Status: venti-mask History of Recent Intubation: Yes Date extubated: 12/07/13 Behavior/Cognition: Cooperative;Lethargic;Requires cueing Oral Cavity - Dentition: Adequate natural dentition Self-Feeding Abilities: Total assist Patient Positioning: Upright in bed Baseline Vocal Quality: Breathy;Low vocal intensity Volitional Cough: Congested;Weak Volitional Swallow: Unable to elicit    Oral/Motor/Sensory Function Overall Oral Motor/Sensory Function: Impaired (Difficult to assess due to weakness, lethargy) Lingual Symmetry: Within  Functional Limits Lingual Strength: Reduced Facial ROM: Within Functional Limits   Ice Chips Ice chips: Impaired Presentation: Spoon Oral Phase Impairments: Reduced lingual movement/coordination Oral Phase Functional Implications: Oral holding Pharyngeal Phase Impairments: Cough - Delayed;Suspected delayed Swallow   Thin Liquid Thin Liquid: Not tested    Nectar Thick Nectar Thick Liquid: Impaired Presentation: Spoon Oral Phase Impairments: Reduced lingual movement/coordination   Honey Thick Honey Thick Liquid: Not tested   Puree Puree: Impaired Presentation: Spoon Oral Phase Impairments: Reduced lingual movement/coordination Oral Phase Functional Implications: Oral holding Pharyngeal Phase Impairments: Cough - Delayed   Solid   GO    Solid: Not tested       [name] 12/08/2013,3:09 PM

## 2013-12-08 NOTE — Progress Notes (Signed)
Subjective: She was able to be extubated yesterday. She has done fairly well from a respiratory point of view but is having some instability of her blood pressure and pulse. She will respond by nodding her head  Objective: Vital signs in last 24 hours: Temp:  [98.7 F (37.1 C)-99.8 F (37.7 C)] 99.8 F (37.7 C) (01/08 0400) Pulse Rate:  [64-120] 77 (01/08 0400) Resp:  [19-32] 31 (01/08 0400) BP: (106-155)/(58-98) 142/74 mmHg (01/08 0400) SpO2:  [87 %-100 %] 91 % (01/08 0709) FiO2 (%):  [50 %-100 %] 50 % (01/08 0709) Weight:  [62.5 kg (137 lb 12.6 oz)] 62.5 kg (137 lb 12.6 oz) (01/08 0400) Weight change: 0.6 kg (1 lb 5.2 oz) Last BM Date: 12/06/13  Intake/Output from previous day: 01/07 0701 - 01/08 0700 In: 0  Out: 1850 [Urine:1850]  PHYSICAL EXAM General appearance: She is awake and will respond to verbal stimuli she does not speak Resp: rhonchi bilaterally Cardio: regular rate and rhythm, S1, S2 normal, no murmur, click, rub or gallop GI: soft, non-tender; bowel sounds normal; no masses,  no organomegaly Extremities: extremities normal, atraumatic, no cyanosis or edema  Lab Results:    Basic Metabolic Panel:  Recent Labs  12/07/13 0415 12/08/13 0432  NA 144 147  K 4.2 3.6*  CL 102 102  CO2 30 30  GLUCOSE 204* 116*  BUN 39* 41*  CREATININE 0.77 0.82  CALCIUM 9.2 9.5  MG 2.2 2.5   Liver Function Tests: No results found for this basename: AST, ALT, ALKPHOS, BILITOT, PROT, ALBUMIN,  in the last 72 hours No results found for this basename: LIPASE, AMYLASE,  in the last 72 hours No results found for this basename: AMMONIA,  in the last 72 hours CBC:  Recent Labs  12/07/13 0415 12/08/13 0432  WBC 19.1* 27.4*  HGB 10.4* 12.2  HCT 31.1* 36.8  MCV 86.4 86.6  PLT 468* 575*   Cardiac Enzymes: No results found for this basename: CKTOTAL, CKMB, CKMBINDEX, TROPONINI,  in the last 72 hours BNP: No results found for this basename: PROBNP,  in the last 72  hours D-Dimer: No results found for this basename: DDIMER,  in the last 72 hours CBG:  Recent Labs  12/07/13 0737 12/07/13 1115 12/07/13 1614 12/07/13 2007 12/08/13 0008 12/08/13 0418  GLUCAP 164* 189* 166* 126* 172* 141*   Hemoglobin A1C: No results found for this basename: HGBA1C,  in the last 72 hours Fasting Lipid Panel: No results found for this basename: CHOL, HDL, LDLCALC, TRIG, CHOLHDL, LDLDIRECT,  in the last 72 hours Thyroid Function Tests: No results found for this basename: TSH, T4TOTAL, FREET4, T3FREE, THYROIDAB,  in the last 72 hours Anemia Panel: No results found for this basename: VITAMINB12, FOLATE, FERRITIN, TIBC, IRON, RETICCTPCT,  in the last 72 hours Coagulation: No results found for this basename: LABPROT, INR,  in the last 72 hours Urine Drug Screen: Drugs of Abuse  No results found for this basename: labopia, cocainscrnur, labbenz, amphetmu, thcu, labbarb    Alcohol Level: No results found for this basename: ETH,  in the last 72 hours Urinalysis:  Recent Labs  12/06/13 1115 12/07/13 1230  COLORURINE YELLOW YELLOW  LABSPEC 1.020 1.015  PHURINE 8.0 7.0  GLUCOSEU NEGATIVE NEGATIVE  HGBUR SMALL* TRACE*  BILIRUBINUR NEGATIVE NEGATIVE  KETONESUR NEGATIVE NEGATIVE  PROTEINUR NEGATIVE NEGATIVE  UROBILINOGEN 0.2 0.2  NITRITE NEGATIVE NEGATIVE  [place]. Labs:  ABGS  Recent Labs  12/07/13 0500  PHART 7.488*  PO2ART 110.0*  TCO2 26.4  HCO3 29.5*   CULTURES Recent Results (from the past 240 hour(s))  CULTURE, BLOOD (ROUTINE X 2)     Status: None   Collection Time    11/30/2013  2:34 PM      Result Value Range Status   Specimen Description BLOOD RIGHT ANTECUBITAL   Final   Special Requests     Final   Value: BOTTLES DRAWN AEROBIC AND ANAEROBIC AEB=12CC ANA=10CC   Culture NO GROWTH 5 DAYS   Final   Report Status 12/04/2013 FINAL   Final  CULTURE, BLOOD (ROUTINE X 2)     Status: None   Collection Time     11/07/2013  2:40 PM      Result Value Range Status   Specimen Description BLOOD RIGHT HAND   Final   Special Requests BOTTLES DRAWN AEROBIC AND ANAEROBIC 5CC   Final   Culture NO GROWTH 5 DAYS   Final   Report Status 12/04/2013 FINAL   Final  MRSA PCR SCREENING     Status: Abnormal   Collection Time    11/16/2013  4:04 PM      Result Value Range Status   MRSA by PCR POSITIVE (*) NEGATIVE Final   Comment:            The GeneXpert MRSA Assay (FDA     approved for NASAL specimens     only), is one component of a     comprehensive MRSA colonization     surveillance program. It is not     intended to diagnose MRSA     infection nor to guide or     monitor treatment for     MRSA infections.     RESULT CALLED TO, READ BACK BY AND VERIFIED WITH:     COLLINS,B. AT 1906 ON 11/21/2013 BY BAUGHAM,M.  CULTURE, RESPIRATORY (NON-EXPECTORATED)     Status: None   Collection Time    11/30/13  3:59 PM      Result Value Range Status   Specimen Description TRACHEAL ASPIRATE   Final   Special Requests BLOODY SPECIMEN   Final   Gram Stain     Final   Value: ABUNDANT WBC PRESENT, PREDOMINANTLY PMN     RARE SQUAMOUS EPITHELIAL CELLS PRESENT     ABUNDANT GRAM POSITIVE COCCI IN PAIRS     IN CLUSTERS IN CHAINS     Performed at [facility]   Culture     Final   Value: ABUNDANT METHICILLIN RESISTANT STAPHYLOCOCCUS AUREUS     Note: RIFAMPIN AND GENTAMICIN SHOULD NOT BE USED AS SINGLE DRUGS FOR TREATMENT OF STAPH INFECTIONS. This organism is presumed to be Clindamycin resistant based on detection of inducible Clindamycin resistance. CRITICAL RESULT CALLED TO, READ BACK BY AND      VERIFIED WITH: Roxy Mastandrea RN 135PM 12/03/13 GUSTK     Performed at [facility]   Report Status 12/03/2013 FINAL   Final   Organism ID, Bacteria METHICILLIN RESISTANT STAPHYLOCOCCUS AUREUS   Final  RESPIRATORY VIRUS PANEL     Status: Abnormal   Collection Time    11/30/13  6:00 PM      Result Value Range Status    Source - RVPAN NASOPHARYNGEAL SWAB   Corrected   Comment: CORRECTED ON 01/02 AT 2306: PREVIOUSLY REPORTED AS NASOPHARYNGEAL SWAB   Respiratory Syncytial Virus A NOT DETECTED   Final   Respiratory Syncytial Virus B NOT DETECTED   Final   Influenza  A DETECTED (*)  Final   Influenza B NOT DETECTED   Final   Parainfluenza 1 NOT DETECTED   Final   Parainfluenza 2 NOT DETECTED   Final   Parainfluenza 3 NOT DETECTED   Final   Metapneumovirus NOT DETECTED   Final   Rhinovirus NOT DETECTED   Final   Adenovirus NOT DETECTED   Final   Influenza A H1 NOT DETECTED   Final   Influenza A H3 DETECTED (*)  Final   Comment: (NOTE)           Normal Reference Range for each Analyte: NOT DETECTED     Testing performed using the Luminex xTAG Respiratory Viral Panel test     kit.     This test was developed and its performance characteristics determined     by [facility]. It has not been cleared or approved by the [place]     Food and Drug Administration. This test is used for clinical purposes.     It should not be regarded as investigational or for research. This     laboratory is certified under the [place] (CLIA) as qualified to perform high complexity     clinical laboratory testing.     Performed at [facility] DIFFICILE BY PCR     Status: None   Collection Time    12/06/13 11:15 AM      Result Value Range Status   C difficile by pcr NEGATIVE  NEGATIVE Final  CLOSTRIDIUM DIFFICILE BY PCR     Status: None   Collection Time    12/07/13 12:30 PM      Result Value Range Status   C difficile by pcr NEGATIVE  NEGATIVE Final   Comment: A STOOL CULTURE AND A GPPCR ARE SUGGESTED.   Studies/Results: Dg Chest Port 1 View  12/07/2013   CLINICAL DATA:  Diastolic heart failure, pneumonia  EXAM: PORTABLE CHEST - 1 VIEW  COMPARISON:  DG CHEST 1V PORT dated 12/06/2013; DG CHEST 1V PORT dated 12/05/2013; DG CHEST 1V PORT dated 12/04/2013; DG  CHEST 1V PORT dated 12/03/2013; CT CHEST W/CM dated 11/30/2013  FINDINGS: There is interval removal of the endotracheal tube and nasogastric tube. There is a left subclavian central venous catheter in unchanged position.  There is bilateral interstitial thickening. There is a trace right pleural effusion. There is dense focal right upper lobe airspace opacity concerning for right upper lobe pneumonia. There is no pneumothorax. Stable cardiomegaly.  There is mild calcification along the rotator cuff which may reflect calcific tendinosis.  IMPRESSION: Persistent right upper lobe pneumonia. Recommend followup radiography in 4-6 weeks, to document complete resolution following adequate medical therapy.  Findings are concerning for mild underlying CHF.   Electronically Signed   By: Kathreen Devoid   On: 12/07/2013 10:58   Dg Chest Port 1 View  12/06/2013   CLINICAL DATA:  Respiratory failure  EXAM: PORTABLE CHEST - 1 VIEW  COMPARISON:  DG CHEST 1V PORT dated 12/05/2013; CT CHEST W/CM dated 11/30/2013; DG CHEST 1V PORT dated 12/04/2013  FINDINGS: Endotracheal tube tip is 1.7 cm above the carina. Left subclavian line tip is at the upper margin of the SVC. Nasogastric tube extends down into the stomach.  Progressive peripheral right upper lobe airspace opacity. Mildly improved aeration at the lung bases, with suspected layering pleural effusion at least on the right. Bilateral increased interstitial accentuation.  Tortuous thoracic aorta  with associated atherosclerotic calcification.  IMPRESSION: 1. Increased airspace opacity peripherally in the right upper lobe. Layering right pleural effusion with bilateral interstitial edema. Slightly improved aeration of both lung bases. 2. Stable appearance of the support apparatus.   Electronically Signed   By: Sherryl Barters M.D.   On: 12/06/2013 12:11    Medications:  Scheduled: . antiseptic oral rinse  15 mL Mouth Rinse QID  . chlorhexidine  15 mL Mouth Rinse BID  . feeding  supplement (PRO-STAT SUGAR FREE 64)  30 mL Oral BID  . furosemide  40 mg Intravenous Daily  . heparin  5,000 Units Subcutaneous Q8H  . insulin aspart  0-20 Units Subcutaneous Q4H  . insulin detemir  15 Units Subcutaneous QHS  . ipratropium-albuterol  3 mL Nebulization Q6H  . methylPREDNISolone (SOLU-MEDROL) injection  40 mg Intravenous Q12H  . pantoprazole (PROTONIX) IV  40 mg Intravenous Daily  . sodium chloride  500 mL Intravenous Once  . sodium chloride  10-40 mL Intracatheter Q12H  . vancomycin  1,000 mg Intravenous Q12H   Continuous:  AFB:[id number], haloperidol lactate, LORazepam, morphine injection, sodium chloride  Assesment: She has healthcare associated pneumonia. This was related to influenza. She was in acute respiratory failure requiring ventilator support. She was able to come off the ventilator yesterday but I think her prognosis is still extremely poor. She is having some instability of vital signs. She's not able to swallow saliva discontinued most of her oral medications. Principal Problem:   Influenza with pneumonia Active Problems:   DCIS (ductal carcinoma in situ) of breast   Hypoadrenalism   Hypertension   Hyperlipidemia   Dementia   Metabolic encephalopathy   Acute on chronic kidney disease, stage 3   HCAP (healthcare-associated pneumonia)   Severe sepsis with septic shock   Hyperglycemia, drug-induced   COPD with exacerbation   Acute respiratory failure   Acute on chronic diastolic heart failure    Plan: Continue to keep her comfortable. I discussed her situation with her son at bedside    LOS: 9 days   Cadel Stairs L 12/08/2013, 7:54 AM

## 2013-12-08 NOTE — Progress Notes (Signed)
UR chart review completed.  

## 2013-12-08 NOTE — Progress Notes (Signed)
NUTRITION FOLLOW UP  Intervention:   Will follow for diet advancement and goals of care.   Nutrition Dx:   Inadequate oral intake related to inability to eat, weakness as evidenced by NPO, delayed swallow initiation.   Goal:   Pt to meet nutrition needs as able  Monitor:   Diet advancement, weight changes, labs, skin assessments, goals of care  Assessment:   Pt was extubated today (18/15). Per family, no redictation.  Noted poor prognosis. ST assessed and pt was found to have delayed swallow due to exhaustion. Pt to remain NPO at this time with ice chips only.  Nutrition needs re-estimated.  Noted TF has been d/c.   Height: Ht Readings from Last 1 Encounters:  12/04/13 5\' 2"  (1.575 m)    Weight Status:   Wt Readings from Last 1 Encounters:  12/08/13 137 lb 12.6 oz (62.5 kg)    Re-estimated needs:  Kcal: 1400-1600 kcals daily Protein: 50-63 grams daily Fluid: 1.4-1.6 L daily  Skin: WDL  Diet Order: NPO   Intake/Output Summary (Last 24 hours) at 12/08/13 1601 Last data filed at 12/08/13 1432  Gross per 24 hour  Intake    600 ml  Output   1850 ml  Net  -1250 ml    Last BM: 12/06/13   Labs:   Recent Labs Lab 12/03/13 1720 12/04/13 0450  12/06/13 0439 12/07/13 0415 12/08/13 0432  NA 141 143  < > 145 144 147  K 3.1* 2.8*  < > 3.6* 4.2 3.6*  CL 102 101  < > 101 102 102  CO2 23 30  < > 34* 30 30  BUN 21 24*  < > 33* 39* 41*  CREATININE 0.72 0.74  < > 0.66 0.77 0.82  CALCIUM 8.6 8.9  < > 9.1 9.2 9.5  MG  --   --   < > 2.0 2.2 2.5  PHOS  --  2.8  --   --   --   --   GLUCOSE 205* 296*  < > 124* 204* 116*  < > = values in this interval not displayed.  CBG (last 3)   Recent Labs  12/08/13 0418 12/08/13 0800 12/08/13 1148  GLUCAP 141* 117* 228*    Scheduled Meds: . antiseptic oral rinse  15 mL Mouth Rinse QID  . chlorhexidine  15 mL Mouth Rinse BID  . furosemide  40 mg Intravenous Daily  . heparin  5,000 Units Subcutaneous Q8H  . insulin aspart   0-20 Units Subcutaneous Q4H  . insulin detemir  15 Units Subcutaneous QHS  . ipratropium-albuterol  3 mL Nebulization Q6H  . methylPREDNISolone (SOLU-MEDROL) injection  40 mg Intravenous Q12H  . pantoprazole (PROTONIX) IV  40 mg Intravenous Daily  . potassium chloride  10 mEq Intravenous Q1 Hr x 4  . sodium chloride  500 mL Intravenous Once  . sodium chloride  10-40 mL Intracatheter Q12H  . vancomycin  1,000 mg Intravenous Q12H    Continuous Infusions: . sodium chloride 50 mL/hr at 12/08/13 1244    Khaleef Ruby A. Jimmye Norman, RD, LDN Pager: [phone]

## 2013-12-08 NOTE — Progress Notes (Signed)
[facility] PROGRESS NOTE  Bonnie [name] KGM:[id number] DOB: 10/28/1927 DOA: December 12, 2013 PCP: Glo Herring., MD  Assessment/Plan: 1. Acute respiratory failure requiring endotracheal intubation and mechanical ventilation secondary to influenza A and MRSA pneumonia. Extubated 1/7. No redictation per family. 2. Severe sepsis with septic shock secondary to pneumonia, influenza. Hemodynamically stable. Continue treatment antibiotics. 3. MRSA pneumonia. Continue antibiotics. 4. Possible COPD exacerbation. Continue steroids, nebulizers, oxygen.  5. The significance of leukocytosis unclear, currently on steroids. Repeat urine culture pending. Remains afebrile. 6. Suspected acute on chronic diastolic heart failure.  7. Influenza A. Complete course of Tamiflu. 8. Steroid-induced hyperglycemia. Continue Levemir. Capillary blood sugars stable. 9. Nutrition. Speech therapy evaluation. 10. Dementia. Appears stable. 11. Diarrhea. C. difficile negative. Imodium. 12. Right lower extremity wound. Wound care.   Continue supplemental oxygen, steroids, nebulizers, antibiotics  Change to oral Lasix 1/9  BMP, CBC in the morning  Add imodium when able  Wound care right lower extremity wound  Speech therapy evaluation  Replace potassium  Patient remains ill with high oxygen requirement. Prognosis guarded, discussed with son and daughter-in-law at bedside. They understand her prognosis remains guarded.   Code Status: DNR. DNI. DVT prophylaxis: heparin Family Communication: as above Disposition Plan: pending  Murray Hodgkins, MD  [facility]  Pager [phone] If 7PM-7AM, please contact night-coverage at [email or web address], password [facility] 12/08/2013, 11:00 AM  LOS: 9 days   Consultants:  Pulmonology  Procedures:  Extubated 1/7.  Antibiotics:  1. Vancomycin 11/30/2013-  2. Tamiflu 11/30/13-12/04/2013  3. Zosyn 12/01/2013-12/04/2013  4. Cefepime  December 12, 2013-12-Dec-2013.   HPI/Subjective: Seems to be doing okay. No definite new issues. Patient denies pain.  Objective: Filed Vitals:   12/08/13 0300 12/08/13 0400 12/08/13 0709 12/08/13 0745  BP: 132/98 142/74    Pulse: 120 77    Temp:  99.8 F (37.7 C)  99.5 F (37.5 C)  TempSrc:    Axillary  Resp: 26 31    Height:      Weight:  62.5 kg (137 lb 12.6 oz)    SpO2: 97% 87% 91%     Intake/Output Summary (Last 24 hours) at 12/08/13 1100 Last data filed at 12/07/13 1400  Gross per 24 hour  Intake      0 ml  Output   1850 ml  Net  -1850 ml     Filed Weights   12/06/13 0500 12/07/13 0500 12/08/13 0400  Weight: 62.3 kg (137 lb 5.6 oz) 61.9 kg (136 lb 7.4 oz) 62.5 kg (137 lb 12.6 oz)    Exam:   Afebrile. Tachypneic. Normotensive but tachycardic. Oxygen saturation 90s on Venturi 50%  General: Appears calm, acute on chronically ill. Nontoxic.  Eyes appear grossly normal  ENT appears grossly unremarkable  Cardiovascular regular rate and rhythm. No murmur, rub or gallop  Telemetry sinus tachycardia  Respiratory clear to auscultation bilaterally, fair air movement. No wheezes, rales or rhonchi. Moderate increased respiratory effort.  Abdomen soft, nontender, nondistended  Skin large right lower extremity skin tear without evidence of infection.Excellent perfusion right foot, warm and dry.   Psychiatric not assessed. Patient does follow simple commands.  Data Reviewed:  Weight without significant change  Adequate urine output   basic metabolic panel unremarkable  White blood cell count has increased. 19.1 >> 27.4.  C. difficile PCR negative.  Add Imodium.  Scheduled Meds: . antiseptic oral rinse  15 mL Mouth Rinse QID  . chlorhexidine  15 mL Mouth Rinse BID  . furosemide  40 mg Intravenous Daily  .  heparin  5,000 Units Subcutaneous Q8H  . insulin aspart  0-20 Units Subcutaneous Q4H  . insulin detemir  15 Units Subcutaneous QHS  .  ipratropium-albuterol  3 mL Nebulization Q6H  . methylPREDNISolone (SOLU-MEDROL) injection  40 mg Intravenous Q12H  . pantoprazole (PROTONIX) IV  40 mg Intravenous Daily  . sodium chloride  500 mL Intravenous Once  . sodium chloride  10-40 mL Intracatheter Q12H  . vancomycin  1,000 mg Intravenous Q12H   Continuous Infusions:   Principal Problem:   Influenza with pneumonia Active Problems:   DCIS (ductal carcinoma in situ) of breast   Hypoadrenalism   Hypertension   Hyperlipidemia   Dementia   Metabolic encephalopathy   Acute on chronic kidney disease, stage 3   HCAP (healthcare-associated pneumonia)   Severe sepsis with septic shock   Hyperglycemia, drug-induced   COPD with exacerbation   Acute respiratory failure   Acute on chronic diastolic heart failure   Time spent 35 minutes

## 2013-12-09 LAB — BASIC METABOLIC PANEL
BUN: 43 mg/dL — AB (ref 6–23)
CO2: 28 meq/L (ref 19–32)
Calcium: 9.5 mg/dL (ref 8.4–10.5)
Chloride: 107 mEq/L (ref 96–112)
Creatinine, Ser: 0.73 mg/dL (ref 0.50–1.10)
GFR calc Af Amer: 88 mL/min — ABNORMAL LOW (ref >90)
GFR calc non Af Amer: 76 mL/min — ABNORMAL LOW (ref >90)
Glucose, Bld: 86 mg/dL (ref 70–99)
POTASSIUM: 3.7 meq/L (ref 3.7–5.3)
SODIUM: 150 meq/L — AB (ref 137–147)

## 2013-12-09 LAB — CBC
HCT: 35.9 % — ABNORMAL LOW (ref 36.0–46.0)
HEMOGLOBIN: 11.7 g/dL — AB (ref 12.0–15.0)
MCH: 28.4 pg (ref 26.0–34.0)
MCHC: 32.6 g/dL (ref 30.0–36.0)
MCV: 87.1 fL (ref 78.0–100.0)
Platelets: 507 10*3/uL — ABNORMAL HIGH (ref 150–400)
RBC: 4.12 MIL/uL (ref 3.87–5.11)
RDW: 15.1 % (ref 11.5–15.5)
WBC: 21.6 10*3/uL — AB (ref 4.0–10.5)

## 2013-12-09 LAB — GLUCOSE, CAPILLARY
GLUCOSE-CAPILLARY: 133 mg/dL — AB (ref 70–99)
GLUCOSE-CAPILLARY: 157 mg/dL — AB (ref 70–99)
Glucose-Capillary: 84 mg/dL (ref 70–99)
Glucose-Capillary: 103 mg/dL — ABNORMAL HIGH (ref 70–99)
Glucose-Capillary: 197 mg/dL — ABNORMAL HIGH (ref 70–99)
Glucose-Capillary: 119 mg/dL — ABNORMAL HIGH (ref 70–99)

## 2013-12-09 LAB — MAGNESIUM: Magnesium: 2.6 mg/dL — ABNORMAL HIGH (ref 1.5–2.5)

## 2013-12-09 MED ORDER — BISACODYL 10 MG RE SUPP: 10.0000 mg | Freq: Every day | RECTAL | Status: DC | PRN

## 2013-12-09 MED ORDER — SODIUM CHLORIDE 0.45 % IV SOLN
INTRAVENOUS | Status: DC
  Administered 2013-12-09 – 2013-12-10 (×2): via INTRAVENOUS

## 2013-12-09 MED ORDER — FUROSEMIDE 10 MG/ML IJ SOLN
20.0000 mg | Freq: Every day | INTRAMUSCULAR | Status: DC
  Administered 2013-12-10: 20 mg via INTRAVENOUS
  Filled 2013-12-09: qty 2

## 2013-12-09 MED ORDER — INSULIN ASPART 100 UNIT/ML ~~LOC~~ SOLN
0.0000 [IU] | Freq: Four times a day (QID) | SUBCUTANEOUS | Status: DC
  Administered 2013-12-10: 5 [IU] via SUBCUTANEOUS
  Administered 2013-12-10: 3 [IU] via SUBCUTANEOUS
  Administered 2013-12-10: 5 [IU] via SUBCUTANEOUS
  Administered 2013-12-10 – 2013-12-11 (×3): 3 [IU] via SUBCUTANEOUS
  Administered 2013-12-11 – 2013-12-12 (×2): 2 [IU] via SUBCUTANEOUS
  Administered 2013-12-12: 3 [IU] via SUBCUTANEOUS
  Administered 2013-12-12 (×2): 2 [IU] via SUBCUTANEOUS

## 2013-12-09 MED ORDER — INSULIN DETEMIR 100 UNIT/ML ~~LOC~~ SOLN
10.0000 [IU] | Freq: Every day | SUBCUTANEOUS | Status: DC
  Filled 2013-12-09 (×3): qty 0.1

## 2013-12-09 NOTE — Clinical Social Work Placement (Signed)
Clinical Social Work Department CLINICAL SOCIAL WORK PLACEMENT NOTE 12/09/2013  Patient:  CENA, BRUHN  Account Number:  [id number] Admit date:  11/05/2013  Clinical Social Worker:  Benay Pike, LCSW  Date/time:  12/09/2013 12:57 PM  Clinical Social Work is seeking post-discharge placement for this patient at the following level of care:   [place]   (*CSW will update this form in Epic as items are completed)   12/09/2013  Patient/family provided with [place] Department of Clinical Social Work's list of facilities offering this level of care within the geographic area requested by the patient (or if unable, by the patient's family).  12/09/2013  Patient/family informed of their freedom to choose among providers that offer the needed level of care, that participate in Medicare, Medicaid or managed care program needed by the patient, have an available bed and are willing to accept the patient.  12/09/2013  Patient/family informed of [facility]' ownership interest in [facility], as well as of the fact that they are under no obligation to receive care at this facility.  PASARR submitted to EDS on 12/09/2013 PASARR number received from EDS on 12/09/2013  FL2 transmitted to all facilities in geographic area requested by pt/family on  12/09/2013 FL2 transmitted to all facilities within larger geographic area on   Patient informed that his/her managed care company has contracts with or will negotiate with  certain facilities, including the following:     Patient/family informed of bed offers received:   Patient chooses bed at  Physician recommends and patient chooses bed at    Patient to be transferred to  on   Patient to be transferred to facility by   The following physician request were entered in Epic:   Additional Comments:  Benay Pike, [place]

## 2013-12-09 NOTE — Progress Notes (Signed)
Subjective: She has done well. She did not do well with her swallowing study that she is much more alert and awake and talking.  Objective: Vital signs in last 24 hours: Temp:  [97.4 F (36.3 C)-99.9 F (37.7 C)] 97.4 F (36.3 C) (01/09 0400) Pulse Rate:  [28-113] 107 (01/09 0600) Resp:  [20-34] 29 (01/09 0600) BP: (129-171)/(60-102) 129/60 mmHg (01/09 0600) SpO2:  [78 %-100 %] 92 % (01/09 0600) FiO2 (%):  [40 %-50 %] 40 % (01/09 0115) Weight change:  Last BM Date: 12/09/13  Intake/Output from previous day: 01/08 0701 - 01/09 0700 In: 1863.3 [I.V.:863.3; IV Piggyback:1000] Out: 3350 [Urine:2950; Stool:400]  PHYSICAL EXAM General appearance: alert and mild distress Resp: rhonchi bilaterally Cardio: regular rate and rhythm, S1, S2 normal, no murmur, click, rub or gallop GI: soft, non-tender; bowel sounds normal; no masses,  no organomegaly Extremities: extremities normal, atraumatic, no cyanosis or edema  Lab Results:    Basic Metabolic Panel:  Recent Labs  12/08/13 0432 12/09/13 0418  NA 147 150*  K 3.6* 3.7  CL 102 107  CO2 30 28  GLUCOSE 116* 86  BUN 41* 43*  CREATININE 0.82 0.73  CALCIUM 9.5 9.5  MG 2.5 2.6*   Liver Function Tests: No results found for this basename: AST, ALT, ALKPHOS, BILITOT, PROT, ALBUMIN,  in the last 72 hours No results found for this basename: LIPASE, AMYLASE,  in the last 72 hours No results found for this basename: AMMONIA,  in the last 72 hours CBC:  Recent Labs  12/08/13 0432 12/09/13 0418  WBC 27.4* 21.6*  HGB 12.2 11.7*  HCT 36.8 35.9*  MCV 86.6 87.1  PLT 575* 507*   Cardiac Enzymes: No results found for this basename: CKTOTAL, CKMB, CKMBINDEX, TROPONINI,  in the last 72 hours BNP: No results found for this basename: PROBNP,  in the last 72 hours D-Dimer: No results found for this basename: DDIMER,  in the last 72 hours CBG:  Recent Labs  12/08/13 1148 12/08/13 1641 12/08/13 2005 12/08/13 2357 12/09/13 0410  12/09/13 0742  GLUCAP 228* 98 129* 133* 84 103*   Hemoglobin A1C: No results found for this basename: HGBA1C,  in the last 72 hours Fasting Lipid Panel: No results found for this basename: CHOL, HDL, LDLCALC, TRIG, CHOLHDL, LDLDIRECT,  in the last 72 hours Thyroid Function Tests: No results found for this basename: TSH, T4TOTAL, FREET4, T3FREE, THYROIDAB,  in the last 72 hours Anemia Panel: No results found for this basename: VITAMINB12, FOLATE, FERRITIN, TIBC, IRON, RETICCTPCT,  in the last 72 hours Coagulation: No results found for this basename: LABPROT, INR,  in the last 72 hours Urine Drug Screen: Drugs of Abuse  No results found for this basename: labopia, cocainscrnur, labbenz, amphetmu, thcu, labbarb    Alcohol Level: No results found for this basename: ETH,  in the last 72 hours Urinalysis:  Recent Labs  12/06/13 1115 12/07/13 1230  COLORURINE YELLOW YELLOW  LABSPEC 1.020 1.015  PHURINE 8.0 7.0  GLUCOSEU NEGATIVE NEGATIVE  HGBUR SMALL* TRACE*  BILIRUBINUR NEGATIVE NEGATIVE  KETONESUR NEGATIVE NEGATIVE  PROTEINUR NEGATIVE NEGATIVE  UROBILINOGEN 0.2 0.2  NITRITE NEGATIVE NEGATIVE  [place]. Labs:  ABGS  Recent Labs  12/07/13 0500  PHART 7.488*  PO2ART 110.0*  TCO2 26.4  HCO3 29.5*   CULTURES Recent Results (from the past 240 hour(s))  CULTURE, BLOOD (ROUTINE X 2)     Status: None   Collection Time    11/09/2013  2:34 PM      Result Value Range Status   Specimen Description BLOOD RIGHT ANTECUBITAL   Final   Special Requests     Final   Value: BOTTLES DRAWN AEROBIC AND ANAEROBIC AEB=12CC ANA=10CC   Culture NO GROWTH 5 DAYS   Final   Report Status 12/04/2013 FINAL   Final  CULTURE, BLOOD (ROUTINE X 2)     Status: None   Collection Time    11/12/2013  2:40 PM      Result Value Range Status   Specimen Description BLOOD RIGHT HAND   Final   Special Requests BOTTLES DRAWN AEROBIC AND ANAEROBIC 5CC   Final   Culture NO GROWTH  5 DAYS   Final   Report Status 12/04/2013 FINAL   Final  MRSA PCR SCREENING     Status: Abnormal   Collection Time    11/19/2013  4:04 PM      Result Value Range Status   MRSA by PCR POSITIVE (*) NEGATIVE Final   Comment:            The GeneXpert MRSA Assay (FDA     approved for NASAL specimens     only), is one component of a     comprehensive MRSA colonization     surveillance program. It is not     intended to diagnose MRSA     infection nor to guide or     monitor treatment for     MRSA infections.     RESULT CALLED TO, READ BACK BY AND VERIFIED WITH:     COLLINS,B. AT 1906 ON 11/28/2013 BY BAUGHAM,M.  CULTURE, RESPIRATORY (NON-EXPECTORATED)     Status: None   Collection Time    11/30/13  3:59 PM      Result Value Range Status   Specimen Description TRACHEAL ASPIRATE   Final   Special Requests BLOODY SPECIMEN   Final   Gram Stain     Final   Value: ABUNDANT WBC PRESENT, PREDOMINANTLY PMN     RARE SQUAMOUS EPITHELIAL CELLS PRESENT     ABUNDANT GRAM POSITIVE COCCI IN PAIRS     IN CLUSTERS IN CHAINS     Performed at [facility]   Culture     Final   Value: ABUNDANT METHICILLIN RESISTANT STAPHYLOCOCCUS AUREUS     Note: RIFAMPIN AND GENTAMICIN SHOULD NOT BE USED AS SINGLE DRUGS FOR TREATMENT OF STAPH INFECTIONS. This organism is presumed to be Clindamycin resistant based on detection of inducible Clindamycin resistance. CRITICAL RESULT CALLED TO, READ BACK BY AND      VERIFIED WITH: Neiva Maenza RN 135PM 12/03/13 GUSTK     Performed at [facility]   Report Status 12/03/2013 FINAL   Final   Organism ID, Bacteria METHICILLIN RESISTANT STAPHYLOCOCCUS AUREUS   Final  RESPIRATORY VIRUS PANEL     Status: Abnormal   Collection Time    11/30/13  6:00 PM      Result Value Range Status   Source - RVPAN NASOPHARYNGEAL SWAB   Corrected   Comment: CORRECTED ON 01/02 AT 2306: PREVIOUSLY REPORTED AS NASOPHARYNGEAL SWAB   Respiratory Syncytial Virus A NOT DETECTED   Final    Respiratory Syncytial Virus B NOT DETECTED   Final   Influenza A DETECTED (*)  Final   Influenza B NOT DETECTED   Final   Parainfluenza 1 NOT DETECTED   Final   Parainfluenza 2 NOT DETECTED   Final   Parainfluenza 3 NOT DETECTED  Final   Metapneumovirus NOT DETECTED   Final   Rhinovirus NOT DETECTED   Final   Adenovirus NOT DETECTED   Final   Influenza A H1 NOT DETECTED   Final   Influenza A H3 DETECTED (*)  Final   Comment: (NOTE)           Normal Reference Range for each Analyte: NOT DETECTED     Testing performed using the Luminex xTAG Respiratory Viral Panel test     kit.     This test was developed and its performance characteristics determined     by [facility]. It has not been cleared or approved by the [place]     Food and Drug Administration. This test is used for clinical purposes.     It should not be regarded as investigational or for research. This     laboratory is certified under the [place] (CLIA) as qualified to perform high complexity     clinical laboratory testing.     Performed at [facility] DIFFICILE BY PCR     Status: None   Collection Time    12/06/13 11:15 AM      Result Value Range Status   C difficile by pcr NEGATIVE  NEGATIVE Final  CLOSTRIDIUM DIFFICILE BY PCR     Status: None   Collection Time    12/07/13 12:30 PM      Result Value Range Status   C difficile by pcr NEGATIVE  NEGATIVE Final   Comment: A STOOL CULTURE AND A GPPCR ARE SUGGESTED.   Studies/Results: Dg Chest Port 1 View  12/07/2013   CLINICAL DATA:  Diastolic heart failure, pneumonia  EXAM: PORTABLE CHEST - 1 VIEW  COMPARISON:  DG CHEST 1V PORT dated 12/06/2013; DG CHEST 1V PORT dated 12/05/2013; DG CHEST 1V PORT dated 12/04/2013; DG CHEST 1V PORT dated 12/03/2013; CT CHEST W/CM dated 11/30/2013  FINDINGS: There is interval removal of the endotracheal tube and nasogastric tube. There is a left subclavian central venous  catheter in unchanged position.  There is bilateral interstitial thickening. There is a trace right pleural effusion. There is dense focal right upper lobe airspace opacity concerning for right upper lobe pneumonia. There is no pneumothorax. Stable cardiomegaly.  There is mild calcification along the rotator cuff which may reflect calcific tendinosis.  IMPRESSION: Persistent right upper lobe pneumonia. Recommend followup radiography in 4-6 weeks, to document complete resolution following adequate medical therapy.  Findings are concerning for mild underlying CHF.   Electronically Signed   By: Kathreen Devoid   On: 12/07/2013 10:58    Medications:  Scheduled: . antiseptic oral rinse  15 mL Mouth Rinse QID  . chlorhexidine  15 mL Mouth Rinse BID  . furosemide  40 mg Intravenous Daily  . heparin  5,000 Units Subcutaneous Q8H  . insulin aspart  0-20 Units Subcutaneous Q4H  . insulin detemir  15 Units Subcutaneous QHS  . ipratropium-albuterol  3 mL Nebulization Q6H  . methylPREDNISolone (SOLU-MEDROL) injection  40 mg Intravenous Q12H  . pantoprazole (PROTONIX) IV  40 mg Intravenous Daily  . sodium chloride  500 mL Intravenous Once  . sodium chloride  10-40 mL Intracatheter Q12H  . vancomycin  1,000 mg Intravenous Q12H   Continuous: . sodium chloride 50 mL/hr at 12/09/13 0600   YJE:[id number], haloperidol lactate, LORazepam, morphine injection, sodium chloride  Assesment: She was admitted with influenza and pneumonia associated with that.  She is now off the ventilator and doing okay. She did not do well with her swallowing evaluation. Her son is wondering if she will need higher level of care when she's ready for discharge from the hospital so I think she should have evaluation for that. I think we can stop her steroids Principal Problem:   Influenza with pneumonia Active Problems:   DCIS (ductal carcinoma in situ) of breast   Hypoadrenalism   Hypertension   Hyperlipidemia   Dementia    Metabolic encephalopathy   Acute on chronic kidney disease, stage 3   HCAP (healthcare-associated pneumonia)   Severe sepsis with septic shock   Hyperglycemia, drug-induced   COPD with exacerbation   Acute respiratory failure   Acute on chronic diastolic heart failure    Plan: DC steroids continue other treatments    LOS: 10 days   Chyenne Sobczak L 12/09/2013, 8:00 AM

## 2013-12-09 NOTE — Progress Notes (Signed)
Speech Language Pathology Treatment: Dysphagia   Patient Details Name: Bonnie [name] MRN: [id number] DOB: 21-Oct-1927 Today's Date: 12/09/2013 Time: 4081-4481 SLP Time Calculation (min): 22 min  Assessment / Plan / Recommendation Clinical Impression  Ms. Arkin is much more alert today, however she has increased confusion. She expressed interest in eating (daughter in law was attempting to feed her ice cream), however cognition negatively impacts safe swallow function at this time. Pt readily opens her mouth for trials (ice chips, puree, nectars), however she holds the bolus for up to 25 seconds before pharyngeal swallow is elicited despite verbal and tactile cues. Pt with weak, loose cough after po trials. Recommend continue NPO with allowance for po meds crushed as able in puree, oral care, and ice chips for comfort. SLP will re-evaluate pt tomorrow.  Above was relayed to Janett Billow, RN, Dr. Sarajane Jews, and daughter in law.    HPI HPI: Bonnie [name] is an 80 yo female admitted from the [place] last week with acute respiratory failure requiring endotracheal intubation and mechanical ventilation secondary to influenza A and MRSA pneumonia. Pt was extubated yesterday.      SLP Plan  Continue with current plan of care    Recommendations Diet recommendations: NPO (ice chips after oral care for comfort) Medication Administration: Crushed with puree              Oral Care Recommendations: Oral care Q4 per protocol Follow up Recommendations: 24 hour supervision/assistance;Skilled Nursing facility Plan: Continue with current plan of care    GO    Thank you,  Genene Churn, [place]  [place] 12/09/2013, 4:49 PM

## 2013-12-09 NOTE — Progress Notes (Signed)
[facility] PROGRESS NOTE  Bonnie [name] MWN:[id number] DOB: 04-28-1927 DOA: 11/14/2013 PCP: Glo Herring., MD  Assessment/Plan: 1. Acute respiratory failure requiring endotracheal intubation and mechanical ventilation secondary to influenza A and MRSA pneumonia. Extubated 1/7. No reintubation per family. Respiratory status appears to be stable and oxygen requirement has significantly decreased. 2. Severe sepsis with septic shock secondary to pneumonia, influenza. Appears clinically resolved. Remains hemodynamically stable. 3. Possible COPD exacerbation. Appears to be slowly improving. Off steroids now per pulmonology. 4. Leukocytosis, improved today. Currently on steroids. Urine culture unremarkable. Remains afebrile. Continue to follow clinically. 5. Influenza A. Completed course of Tamiflu. 6. Suspected acute on chronic diastolic heart failure. Appears to be euvolemic at this point. 7. Steroid-induced hyperglycemia appears resolved. Fasting blood sugar 84. 8. Nutrition. Assessed again by speech therapy. Recommends ice chips at this point. Speech therapy will reevaluate in the next 48 hours to make further recommendations. 9. Dementia. Stable with some confusion above baseline. 10. Diarrhea. C. difficile negative. Imodium when able to take pills. 11. Right lower extremity wound. Wound care.   Continue oxygen, nebulizers, antibiotics. Wean steroids.  Consult physical therapy, occupational therapy, social work  Decrease Levemir  Decrease Lasix, change to PO when able  Ice chips only for now  Transfer to medical bed  Remains ill but stable with guarded prognosis. Discussed with daughter-in-law at bedside.  Code Status: DNR. DNI. DVT prophylaxis: heparin Family Communication: as above Disposition Plan: pending  Murray Hodgkins, MD  [facility]  Pager [phone] If 7PM-7AM, please contact night-coverage at [email or web address], password [facility] 12/09/2013, 9:35 AM  LOS: 10  days   Consultants:  Pulmonology  Speech therapy. Ice chips.  Procedures:  Extubated 1/7.  Antibiotics:  1. Vancomycin 11/30/2013-  2. Tamiflu 11/30/13-12/04/2013  3. Zosyn 12/01/2013-12/04/2013  4. Cefepime 11/05/2013-11/09/2013.  HPI/Subjective: Speech therapy recommended ice chips. Has done very well per nursing with stable oxygen requirement. More alert. Patient confused, no complaints offered.  Objective: Filed Vitals:   12/09/13 0400 12/09/13 0500 12/09/13 0600 12/09/13 0910  BP:  137/102 129/60   Pulse:  102 107   Temp: 97.4 F (36.3 C)     TempSrc:      Resp: 23 20 29    Height:      Weight:      SpO2:  92% 92% 95%    Intake/Output Summary (Last 24 hours) at 12/09/13 0935 Last data filed at 12/09/13 0600  Gross per 24 hour  Intake 1863.33 ml  Output   3350 ml  Net -1486.67 ml     Filed Weights   12/06/13 0500 12/07/13 0500 12/08/13 0400  Weight: 62.3 kg (137 lb 5.6 oz) 61.9 kg (136 lb 7.4 oz) 62.5 kg (137 lb 12.6 oz)    Exam:   Afebrile. Remains tachypneic but oxygen requirements have decreased. Mild tachycardia. Hemodynamically stable.  General: Sitting up in chair. Appears confused but more alert today. Nontoxic. Appears ill.  Cardiovascular: Regular rate and rhythm. No murmur, rub or gallop. No lower extremity edema. Feet warm and dry. Perfusion appears grossly normal.  Respiratory: Fair air movement. No frank wheezes, rales or rhonchi. Moderate increased respiratory effort.  Telemetry sinus tachycardia  Abdomen soft, nontender, nondistended  Musculoskeletal appears grossly unremarkable  Psychiatric appears confused.  Data Reviewed:  Weight without significant change  Excellent urine output   Sodium increased, 150. BUN increased. Creatinine stable.  WBC has decreased significantly, down to 21.6. Hemoglobin stable 11.7.  Scheduled Meds: . antiseptic oral rinse  15 mL Mouth  Rinse QID  . chlorhexidine  15 mL Mouth Rinse BID  .  furosemide  40 mg Intravenous Daily  . heparin  5,000 Units Subcutaneous Q8H  . insulin aspart  0-20 Units Subcutaneous Q4H  . insulin detemir  15 Units Subcutaneous QHS  . ipratropium-albuterol  3 mL Nebulization Q6H  . pantoprazole (PROTONIX) IV  40 mg Intravenous Daily  . sodium chloride  500 mL Intravenous Once  . sodium chloride  10-40 mL Intracatheter Q12H  . vancomycin  1,000 mg Intravenous Q12H   Continuous Infusions: . sodium chloride 50 mL/hr at 12/09/13 0600    Principal Problem:   Influenza with pneumonia Active Problems:   DCIS (ductal carcinoma in situ) of breast   Hypoadrenalism   Hypertension   Hyperlipidemia   Dementia   Metabolic encephalopathy   Acute on chronic kidney disease, stage 3   HCAP (healthcare-associated pneumonia)   Severe sepsis with septic shock   Hyperglycemia, drug-induced   COPD with exacerbation   Acute respiratory failure   Acute on chronic diastolic heart failure   Time spent 30 minutes

## 2013-12-09 NOTE — Progress Notes (Signed)
Report called and given to latoya, Therapist, sports. Patient in stable condition at the time of transport. Patient transported in bed to room 314.

## 2013-12-09 NOTE — Clinical Social Work Note (Signed)
CSW discussed with pt's son Audry Pili that pt will likely need higher level of care at d/c and he is agreeable. Okay to initiate bed search in [place]. SNF list left in room. Notified [facility] of SNF. CSW will follow up with bed offers when available.  Benay Pike, [place]

## 2013-12-10 LAB — GLUCOSE, CAPILLARY
GLUCOSE-CAPILLARY: 153 mg/dL — AB (ref 70–99)
Glucose-Capillary: 198 mg/dL — ABNORMAL HIGH (ref 70–99)
Glucose-Capillary: 213 mg/dL — ABNORMAL HIGH (ref 70–99)
Glucose-Capillary: 163 mg/dL — ABNORMAL HIGH (ref 70–99)

## 2013-12-10 LAB — PHOSPHORUS: Phosphorus: 4.3 mg/dL (ref 2.3–4.6)

## 2013-12-10 LAB — BASIC METABOLIC PANEL
BUN: 54 mg/dL — ABNORMAL HIGH (ref 6–23)
CO2: 24 mEq/L (ref 19–32)
Calcium: 9.2 mg/dL (ref 8.4–10.5)
Chloride: 108 mEq/L (ref 96–112)
Creatinine, Ser: 1.24 mg/dL — ABNORMAL HIGH (ref 0.50–1.10)
GFR calc Af Amer: 45 mL/min — ABNORMAL LOW (ref >90)
GFR, EST NON AFRICAN AMERICAN: 39 mL/min — AB (ref >90)
GLUCOSE: 162 mg/dL — AB (ref 70–99)
Potassium: 3.2 mEq/L — ABNORMAL LOW (ref 3.7–5.3)
SODIUM: 151 meq/L — AB (ref 137–147)

## 2013-12-10 MED ORDER — POTASSIUM CHLORIDE 10 MEQ/100ML IV SOLN
10.0000 meq | INTRAVENOUS | Status: AC
  Administered 2013-12-10 (×4): 10 meq via INTRAVENOUS
  Filled 2013-12-10: qty 100

## 2013-12-10 MED ORDER — ALBUTEROL SULFATE (2.5 MG/3ML) 0.083% IN NEBU: 2.5000 mg | INHALATION_SOLUTION | RESPIRATORY_TRACT | Status: DC | PRN

## 2013-12-10 MED ORDER — HYDROCORTISONE 10 MG PO TABS
10.0000 mg | ORAL_TABLET | Freq: Every day | ORAL | Status: DC
  Administered 2013-12-12: 10 mg via ORAL
  Filled 2013-12-10 (×4): qty 1

## 2013-12-10 MED ORDER — IPRATROPIUM-ALBUTEROL 0.5-2.5 (3) MG/3ML IN SOLN
3.0000 mL | Freq: Four times a day (QID) | RESPIRATORY_TRACT | Status: DC
  Administered 2013-12-10 – 2013-12-12 (×10): 3 mL via RESPIRATORY_TRACT
  Filled 2013-12-10 (×10): qty 3

## 2013-12-10 MED ORDER — DULOXETINE HCL 30 MG PO CPEP
30.0000 mg | ORAL_CAPSULE | Freq: Every day | ORAL | Status: DC
  Administered 2013-12-12: 30 mg via ORAL
  Filled 2013-12-10 (×2): qty 1

## 2013-12-10 NOTE — Progress Notes (Addendum)
[facility] PROGRESS NOTE  RANDALL RAMPERSAD PZW:[id number] DOB: 16-Mar-1927 DOA: 11/20/2013 PCP: Glo Herring., MD  Assessment/Plan: 1. Acute respiratory failure requiring endotracheal intubation and mechanical ventilation secondary to influenza A and MRSA pneumonia. Extubated 1/7. No reintubation per family. Appears to be stable at this point on nasal cannula oxygen although clinical status remains tenuous. 2. Severe sepsis with septic shock secondary to pneumonia, influenza. Hemodynamically stable. Sepsis resolved. 3. Possible COPD exacerbation. Appears to have resolved. Steroids discontinued per pulmonology, COPD doubted. 4. Dehydration. Secondary to diuretics and limited intake. IVF. 5. Influenza A. Completed course of Tamiflu. 6. Suspected acute on chronic diastolic heart failure. Appears euvolemic. 7. Steroid-induced hyperglycemia persists after stopping steroids. Check HgbA1c. 8. Nutrition. Diet per speech therapy. 9. Diarrhea, C. difficile negative. Start Imodium. 10. Dementia. Appears stable. On memory care unit at [facility]. 11. Right large him a skin tear. 12. H/o adrenal insufficiency 13. H/o breast cancer 14. Anemia of critical illness 15. L.UPJ Uretric stone on CT scan - nonobstructing, stable repeat renal ultrasound with no evidence of hydronephrosis.   Continue supplemental oxygen, bronchodilators and antibiotics.  Diet per speech therapy.  Discontinue Lasix. Monitor volume status.  BMP in the morning. Continue IVF.  Remains ill with guarded prognosis. Discussed with sister at bedside.  HgbA1c in AM  Imodium   Code Status: DNR. DNI. DVT prophylaxis: heparin Family Communication: as above Disposition Plan: pending  Murray Hodgkins, MD  [facility]  Pager [phone] If 7PM-7AM, please contact night-coverage at [email or web address], password [facility] 12/10/2013, 4:53 PM  LOS: 11 days   Summary: 80 year old woman admitted with acute respiratory  distress, quickly decompensated and required intubation and mechanical ventilation. Tracheal aspirate grew MRSA and she was treated for MRSA pneumonia, septic shock, influenza A, acute on chronic diastolic heart failure.  Consultants:  Pulmonology  Speech therapy. Dysphagia 1 diet. Nectar thick liquids.  Procedures:  ETT 12/31 >> 1/7.  Antibiotics:  1. Vancomycin 11/30/2013-  2. Tamiflu 11/30/13-12/04/2013  3. Zosyn 12/01/2013-12/04/2013  4. Cefepime 11/27/2013-11/02/2013.  HPI/Subjective: Now on diet per ST. Very weak but no new issues.  Objective: Filed Vitals:   12/10/13 0612 12/10/13 1109 12/10/13 1325 12/10/13 1516  BP:   110/70   Pulse:   102   Temp:   98 F (36.7 C)   TempSrc:   Oral   Resp:   20   Height:      Weight:      SpO2: 94% 94% 91% 92%    Intake/Output Summary (Last 24 hours) at 12/10/13 1653 Last data filed at 12/10/13 0600  Gross per 24 hour  Intake    900 ml  Output    600 ml  Net    300 ml     Filed Weights   12/06/13 0500 12/07/13 0500 12/08/13 0400  Weight: 62.3 kg (137 lb 5.6 oz) 61.9 kg (136 lb 7.4 oz) 62.5 kg (137 lb 12.6 oz)    Exam:   Afebrile. Normotensive. Stable hypoxia on 3 L nasal cannula.  General: Appears weak, debilitated, ill but not toxic.  Cardiovascular: Regular rate and. No murmur, rub or gallop. No lower extremity edema.  Respiratory: Fair air movement some coarse breath sounds bilaterally. Mild increased respiratory effort.  Abdomen soft  Psychiatric she attempts to speak but is quite weak.  Data Reviewed:  Adequate urine output  Capillary blood sugars stable  Sodium 150, potassium 3.2  BUN and creatinine trending upwards, 54/1.24  Phosphorus normal.  Scheduled Meds: . antiseptic oral rinse  15 mL Mouth Rinse QID  . chlorhexidine  15 mL Mouth Rinse BID  . furosemide  20 mg Intravenous Daily  . heparin  5,000 Units Subcutaneous Q8H  . insulin aspart  0-15 Units Subcutaneous Q6H  .  ipratropium-albuterol  3 mL Nebulization QID  . pantoprazole (PROTONIX) IV  40 mg Intravenous Daily  . sodium chloride  500 mL Intravenous Once  . sodium chloride  10-40 mL Intracatheter Q12H  . vancomycin  1,000 mg Intravenous Q12H   Continuous Infusions: . sodium chloride 50 mL/hr at 12/10/13 1023    Principal Problem:   Influenza with pneumonia Active Problems:   DCIS (ductal carcinoma in situ) of breast   Hypoadrenalism   Hypertension   Hyperlipidemia   Dementia   Metabolic encephalopathy   Acute on chronic kidney disease, stage 3   HCAP (healthcare-associated pneumonia)   Severe sepsis with septic shock   Hyperglycemia, drug-induced   COPD with exacerbation   Acute respiratory failure   Acute on chronic diastolic heart failure   Time spent 30 minutes

## 2013-12-10 NOTE — Progress Notes (Signed)
Versed drip wasted in full with M.Amedeo Plenty, PharmD.

## 2013-12-10 NOTE — Progress Notes (Signed)
Speech Language Pathology Treatment: Dysphagia   Patient Details Name: Bonnie [name] MRN: [id number] DOB: 09-Dec-1926 Today's Date: 12/10/2013 Time: 4888-9169 SLP Time Calculation (min): 33 min  Assessment / Plan / Recommendation Clinical Impression  Bonnie [name] demonstrates improved alertness and attention to task today, although she still requires moderate verbal cues to "swallow". Swallow initiation continues to be delayed and pt swallows multiple times for one small sip. Pt demonstrates improved control with nectar-thick liquids (over thin). Will initiate d1/puree and nectar-thick liquids with 1:1 feeder assist with nursing staff. Feed pt only when most alert and cue pt to "swallow" if she holds bolus in mouth.  SLP will follow on Monday for diet tolerance and upgrades as appropriate.   HPI HPI: Bonnie [name] is an 80 yo female admitted from the [place] last week with acute respiratory failure requiring endotracheal intubation and mechanical ventilation secondary to influenza A and MRSA pneumonia. Pt was extubated yesterday.   Pertinent Vitals   SLP Plan  Goals updated    Recommendations Diet recommendations: Dysphagia 1 (puree);Nectar-thick liquid Liquids provided via: Cup;Straw Medication Administration: Crushed with puree Supervision: Full supervision/cueing for compensatory strategies;Staff to assist with self feeding Compensations: Slow rate;Small sips/bites (Pt may need verbal cues to "swallow") Postural Changes and/or Swallow Maneuvers: Seated upright 90 degrees;Upright 30-60 min after meal              Oral Care Recommendations: Oral care BID;Staff/trained caregiver to provide oral care Follow up Recommendations: 24 hour supervision/assistance;Skilled Nursing facility Plan: Goals updated    GO    Thank you,  Genene Churn, [place]  [place] 12/10/2013, 12:38 PM

## 2013-12-10 NOTE — Progress Notes (Signed)
She has improved markedly as far as her acute respiratory failure is concerned area she is now on a regular room and has had speech evaluation. I'm going to plan to sign off at this point but I would be glad to be more active if further pulmonary help is needed.  Thanks for allowing me to see her with you

## 2013-12-10 NOTE — Progress Notes (Addendum)
Margarita Grizzle (pt's son) spoke to me about placement for pt. They would prefer her to go to the [facility] over [facility] if there is a bed offer and an opening from there. Will continue to monitor.

## 2013-12-11 DIAGNOSIS — F0390 Unspecified dementia without behavioral disturbance: Secondary | ICD-10-CM

## 2013-12-11 LAB — BASIC METABOLIC PANEL
BUN: 57 mg/dL — AB (ref 6–23)
CALCIUM: 8.8 mg/dL (ref 8.4–10.5)
CO2: 22 mEq/L (ref 19–32)
Chloride: 110 mEq/L (ref 96–112)
Creatinine, Ser: 1.67 mg/dL — ABNORMAL HIGH (ref 0.50–1.10)
GFR calc Af Amer: 31 mL/min — ABNORMAL LOW (ref >90)
GFR, EST NON AFRICAN AMERICAN: 27 mL/min — AB (ref >90)
Glucose, Bld: 168 mg/dL — ABNORMAL HIGH (ref 70–99)
Potassium: 3.4 mEq/L — ABNORMAL LOW (ref 3.7–5.3)
Sodium: 151 mEq/L — ABNORMAL HIGH (ref 137–147)

## 2013-12-11 LAB — GLUCOSE, CAPILLARY
GLUCOSE-CAPILLARY: 116 mg/dL — AB (ref 70–99)
GLUCOSE-CAPILLARY: 161 mg/dL — AB (ref 70–99)
GLUCOSE-CAPILLARY: 171 mg/dL — AB (ref 70–99)
GLUCOSE-CAPILLARY: 216 mg/dL — AB (ref 70–99)
Glucose-Capillary: 123 mg/dL — ABNORMAL HIGH (ref 70–99)

## 2013-12-11 LAB — CBC
HCT: 35.2 % — ABNORMAL LOW (ref 36.0–46.0)
Hemoglobin: 12 g/dL (ref 12.0–15.0)
MCH: 29.3 pg (ref 26.0–34.0)
MCHC: 34.1 g/dL (ref 30.0–36.0)
MCV: 85.9 fL (ref 78.0–100.0)
PLATELETS: 462 10*3/uL — AB (ref 150–400)
RBC: 4.1 MIL/uL (ref 3.87–5.11)
RDW: 14.8 % (ref 11.5–15.5)
WBC: 22.1 10*3/uL — ABNORMAL HIGH (ref 4.0–10.5)

## 2013-12-11 LAB — VANCOMYCIN, RANDOM: Vancomycin Rm: 41.9 ug/mL

## 2013-12-11 LAB — HEMOGLOBIN A1C
Hgb A1c MFr Bld: 6.4 % — ABNORMAL HIGH (ref <5.7)
Mean Plasma Glucose: 137 mg/dL — ABNORMAL HIGH (ref <117)

## 2013-12-11 MED ORDER — LOPERAMIDE HCL 2 MG PO CAPS: 2.0000 mg | ORAL_CAPSULE | ORAL | Status: DC | PRN

## 2013-12-11 MED ORDER — SODIUM CHLORIDE 0.45 % IV SOLN: INTRAVENOUS | Status: DC

## 2013-12-11 MED ORDER — POTASSIUM CHLORIDE 10 MEQ/100ML IV SOLN
10.0000 meq | INTRAVENOUS | Status: AC
  Administered 2013-12-11 (×4): 10 meq via INTRAVENOUS
  Filled 2013-12-11: qty 100

## 2013-12-11 NOTE — Progress Notes (Signed)
Patients foley removed per protocol. Patient expected to void by 2100. Will continue to monitor patient and encourage urination at this time.

## 2013-12-11 NOTE — Progress Notes (Signed)
Patient has a temperature of 101.6. Text-paged midlevel hospitalist to inform of this. No new orders received. Will give PRN dose of tylenol.

## 2013-12-11 NOTE — Progress Notes (Signed)
Pharmacy called and told RN to hold vanc IVPB in order to get stat vanc trough as pt's kidney function tests have deteriorated slightly.

## 2013-12-11 NOTE — Progress Notes (Signed)
[facility] for Vancomycin Indication: Pneumonia (MRSA)  Allergies  Allergen Reactions  . Macrodantin Nausea Only    It also causes fever   Patient Measurements: Height: 5\' 2"  (157.5 cm) Weight: 137 lb 12.6 oz (62.5 kg) IBW/kg (Calculated) : 50.1  Vital Signs: Temp: 98.1 F (36.7 C) (01/11 0617) Temp src: Oral (01/11 0617) BP: 120/48 mmHg (01/11 0617) Pulse Rate: 92 (01/11 0617) Intake/Output from previous day: 01/10 0701 - 01/11 0700 In: 1218.3 [I.V.:1218.3] Out: 1150 [Urine:1150] Intake/Output from this shift:    Labs:  Recent Labs  12/09/13 0418 12/10/13 0611 12/11/13 0549 12/11/13 0740  WBC 21.6*  --   --  22.1*  HGB 11.7*  --   --  12.0  PLT 507*  --   --  462*  CREATININE 0.73 1.24* 1.67*  --    Estimated Creatinine Clearance: 21.8 ml/min (by C-G formula based on Cr of 1.67). No results found for this basename: VANCOTROUGH, Corlis Leak, VANCORANDOM, [place], GENTPEAK, [place], [place], TOBRAPEAK, TOBRARND, AMIKACINPEAK, AMIKACINTROU, AMIKACIN,  in the last 72 hours  Microbiology: Recent Results (from the past 720 hour(s))  CULTURE, BLOOD (ROUTINE X 2)     Status: None   Collection Time    11/16/2013  2:34 PM      Result Value Range Status   Specimen Description BLOOD RIGHT ANTECUBITAL   Final   Special Requests     Final   Value: BOTTLES DRAWN AEROBIC AND ANAEROBIC AEB=12CC ANA=10CC   Culture NO GROWTH 5 DAYS   Final   Report Status 12/04/2013 FINAL   Final  CULTURE, BLOOD (ROUTINE X 2)     Status: None   Collection Time    11/17/2013  2:40 PM      Result Value Range Status   Specimen Description BLOOD RIGHT HAND   Final   Special Requests BOTTLES DRAWN AEROBIC AND ANAEROBIC 5CC   Final   Culture NO GROWTH 5 DAYS   Final   Report Status 12/04/2013 FINAL   Final  MRSA PCR SCREENING     Status: Abnormal   Collection Time    11/15/2013  4:04 PM      Result Value Range Status   MRSA by PCR POSITIVE (*) NEGATIVE Final    Comment:            The GeneXpert MRSA Assay (FDA     approved for NASAL specimens     only), is one component of a     comprehensive MRSA colonization     surveillance program. It is not     intended to diagnose MRSA     infection nor to guide or     monitor treatment for     MRSA infections.     RESULT CALLED TO, READ BACK BY AND VERIFIED WITH:     COLLINS,B. AT 1906 ON 11/14/2013 BY BAUGHAM,M.  CULTURE, RESPIRATORY (NON-EXPECTORATED)     Status: None   Collection Time    11/30/13  3:59 PM      Result Value Range Status   Specimen Description TRACHEAL ASPIRATE   Final   Special Requests BLOODY SPECIMEN   Final   Gram Stain     Final   Value: ABUNDANT WBC PRESENT, PREDOMINANTLY PMN     RARE SQUAMOUS EPITHELIAL CELLS PRESENT     ABUNDANT GRAM POSITIVE COCCI IN PAIRS     IN CLUSTERS IN CHAINS     Performed at [facility]  Final   Value: ABUNDANT METHICILLIN RESISTANT STAPHYLOCOCCUS AUREUS     Note: RIFAMPIN AND GENTAMICIN SHOULD NOT BE USED AS SINGLE DRUGS FOR TREATMENT OF STAPH INFECTIONS. This organism is presumed to be Clindamycin resistant based on detection of inducible Clindamycin resistance. CRITICAL RESULT CALLED TO, READ BACK BY AND      VERIFIED WITH: HAWKINS RN 135PM 12/03/13 GUSTK     Performed at [facility]   Report Status 12/03/2013 FINAL   Final   Organism ID, Bacteria METHICILLIN RESISTANT STAPHYLOCOCCUS AUREUS   Final  RESPIRATORY VIRUS PANEL     Status: Abnormal   Collection Time    11/30/13  6:00 PM      Result Value Range Status   Source - RVPAN NASOPHARYNGEAL SWAB   Corrected   Comment: CORRECTED ON 01/02 AT 2306: PREVIOUSLY REPORTED AS NASOPHARYNGEAL SWAB   Respiratory Syncytial Virus A NOT DETECTED   Final   Respiratory Syncytial Virus B NOT DETECTED   Final   Influenza A DETECTED (*)  Final   Influenza B NOT DETECTED   Final   Parainfluenza 1 NOT DETECTED   Final   Parainfluenza 2 NOT DETECTED   Final   Parainfluenza 3  NOT DETECTED   Final   Metapneumovirus NOT DETECTED   Final   Rhinovirus NOT DETECTED   Final   Adenovirus NOT DETECTED   Final   Influenza A H1 NOT DETECTED   Final   Influenza A H3 DETECTED (*)  Final   Comment: (NOTE)           Normal Reference Range for each Analyte: NOT DETECTED     Testing performed using the Luminex xTAG Respiratory Viral Panel test     kit.     This test was developed and its performance characteristics determined     by [facility]. It has not been cleared or approved by the [place]     Food and Drug Administration. This test is used for clinical purposes.     It should not be regarded as investigational or for research. This     laboratory is certified under the [place] (CLIA) as qualified to perform high complexity     clinical laboratory testing.     Performed at [facility] DIFFICILE BY PCR     Status: None   Collection Time    12/06/13 11:15 AM      Result Value Range Status   C difficile by pcr NEGATIVE  NEGATIVE Final  CLOSTRIDIUM DIFFICILE BY PCR     Status: None   Collection Time    12/07/13 12:30 PM      Result Value Range Status   C difficile by pcr NEGATIVE  NEGATIVE Final   Comment: A STOOL CULTURE AND A GPPCR ARE SUGGESTED.   Medical History: Past Medical History  Diagnosis Date  . GERD (gastroesophageal reflux disease)   . Colitis, ischemic   . Osteoarthritis   . Breast cancer     Right Breast - May 2013/ Left Breast - 2003  . Anxiety   . Stress incontinence, female   . Constipation - functional   . Hypertension   . Headache(784.0)   . Hx: UTI (urinary tract infection)   . HOH (hard of hearing)     both ears; refuses to wear hearing aides  . Dementia   . Benign essential tremor     left hand; intermittent  .  Hyperlipidemia   . Adrenal gland hypofunction     takes cortisone daily  . S/P radiation therapy 04/15/12 -05/13/12    RIGHT BREAST  . Chronic  constipation 01/04/13  . Fatigue 02/25/13    chronic  . Collar bone fracture   . Depression   . Glucocorticoid deficiency    Medications:  Scheduled:  . antiseptic oral rinse  15 mL Mouth Rinse QID  . chlorhexidine  15 mL Mouth Rinse BID  . DULoxetine  30 mg Oral Daily  . heparin  5,000 Units Subcutaneous Q8H  . hydrocortisone  10 mg Oral Daily  . insulin aspart  0-15 Units Subcutaneous Q6H  . ipratropium-albuterol  3 mL Nebulization QID  . pantoprazole (PROTONIX) IV  40 mg Intravenous Daily  . sodium chloride  500 mL Intravenous Once  . sodium chloride  10-40 mL Intracatheter Q12H  . vancomycin  1,000 mg Intravenous Q12H   Assessment: 80 yo F admitted from NH with flu-like symptoms.  CXR + PNA.  Renal function worsening with  Estimated Creatinine Clearance: 21.8 ml/min (by C-G formula based on Cr of 1.67).   Cefepime 12/30>> 1/1 Vancomycin 12/30 >> Zosyn 1/1 >> 1/4  Goal of Therapy:  Vancomycin trough level 15-20 mcg/ml  Plan:  Check random Vancomycin level now. RN instructed not to give this AM dose until level interpreted.  Biagio Quint R 12/11/2013,11:11 AM

## 2013-12-11 NOTE — Progress Notes (Signed)
[facility] PROGRESS NOTE  Bonnie [name] BDZ:[id number] DOB: 02-Sep-1927 DOA: 12-18-2013 PCP: Glo Herring., MD  Assessment/Plan: 1. Acute respiratory failure requiring endotracheal intubation and mechanical ventilation secondary to influenza A and MRSA pneumonia. Extubated 1/7. No reintubation per family. Stable on nasal cannula oxygen. 2. Status post severe sepsis with septic shock secondary to pneumonia, influenza. Remained stable. 3. Recurrent fever. Monitor clinically. Possibly atelectasis. No evidence to suggest new aspiration or worsening of pneumonia, if recurs see below. 4. Dehydration, acute renal failure. Likely related to limited oral intake and diuretics. Adjust IV fluids. 5. Influenza A, completed course of Tamiflu. 6. Suspected acute on chronic diastolic heart failure. Appears clinically dry. 7. Hyperglycemia. No history of diabetes. Hemoglobin A1c pending. Continue SSI. 8. Dehydration, acute renal failure. Suspect secondary to decreased oral intake, dehydration rather than vancomycin toxicity. IV fluids. 9. Hypokalemia. 10. C. difficile negative diarrhea. 11. Right lower extremity skin tear. Local wound care. 12. History of adrenal insufficiency. Continue cortisol. 13. Dementia. Stable. On memory care unit at [place]. 14. Anemia of critical illness. Recovering nicely.  15. Left UPJ stone without obstruction, stable on repeat u/s with no hydronephrosis.   If recurrent fever, check blood cultures, consider repeat chest x-ray.  IVF   Sliding-scale insulin. Followup hemoglobin A1c. Consider low-dose long-acting insulin.  Replace potassium.  Immodium  Code Status: DNR. DNI. DVT prophylaxis: heparin Family Communication: as above Disposition Plan: pending  Murray Hodgkins, MD  [facility]  Pager [phone] If 7PM-7AM, please contact night-coverage at [email or web address], password [facility] 12/11/2013, 11:51 AM  LOS: 12 days   Summary: 80 year old  woman admitted with acute respiratory distress, quickly decompensated and required intubation and mechanical ventilation. Tracheal aspirate grew MRSA and she was treated for MRSA pneumonia, septic shock, influenza A, acute on chronic diastolic heart failure.  Consultants:  Pulmonology  Speech therapy. Dysphagia 1 diet. Nectar thick liquids.  Procedures:  ETT 12/31 >> 1/7.  Antibiotics:  1. Vancomycin 11/30/2013-  2. Tamiflu 11/30/13-12/04/2013  3. Zosyn 12/01/2013-12/04/2013  4. Cefepime 12/18/13-2013/12/18.  HPI/Subjective: Fever last night. Patient unable to offer history.  Objective: Filed Vitals:   12/10/13 2300 12/11/13 0331 12/11/13 0617 12/11/13 0723  BP:   120/48   Pulse:   92   Temp: 101.6 F (38.7 C) 99.3 F (37.4 C) 98.1 F (36.7 C)   TempSrc:   Oral   Resp:   12   Height:      Weight:      SpO2:   96% 97%    Intake/Output Summary (Last 24 hours) at 12/11/13 1151 Last data filed at 12/11/13 0622  Gross per 24 hour  Intake 1218.33 ml  Output   1150 ml  Net  68.33 ml     Filed Weights   12/06/13 0500 12/07/13 0500 12/08/13 0400  Weight: 62.3 kg (137 lb 5.6 oz) 61.9 kg (136 lb 7.4 oz) 62.5 kg (137 lb 12.6 oz)    Exam:   Tmax 101.6. Respiratory rate normal. Hemodynamics stable. Oxygen requirement stable on 3 L.  General: Appears calm and comfortable. Alert. Confused.  Cardiovascular: Regular rate and rhythm. No murmur, rub or gallop. No lower extremity edema. Both feet warm and dry.  Respiratory: Clear to auscultation bilaterally with good air movement. No wheezes, rales or rhonchi. Normal respiratory effort.  Abdomen soft nontender and nondistended.  Skin no rash or induration seen  Psychiatric appears confused  Data Reviewed:  Adequate urine output 1150  Capillary blood sugars stable, mostly under 200  Sodium  151, no change. Potassium 3.4. BUN 57, creatinine 1.67, continuing to trend upwards.  WBC 22.1, no significant change.  Persistently elevated since 1/7.  Scheduled Meds: . antiseptic oral rinse  15 mL Mouth Rinse QID  . chlorhexidine  15 mL Mouth Rinse BID  . DULoxetine  30 mg Oral Daily  . heparin  5,000 Units Subcutaneous Q8H  . hydrocortisone  10 mg Oral Daily  . insulin aspart  0-15 Units Subcutaneous Q6H  . ipratropium-albuterol  3 mL Nebulization QID  . pantoprazole (PROTONIX) IV  40 mg Intravenous Daily  . sodium chloride  500 mL Intravenous Once  . sodium chloride  10-40 mL Intracatheter Q12H  . vancomycin  1,000 mg Intravenous Q12H   Continuous Infusions: . sodium chloride 50 mL/hr at 12/10/13 1023    Principal Problem:   Influenza with pneumonia Active Problems:   DCIS (ductal carcinoma in situ) of breast   Hypoadrenalism   Hypertension   Hyperlipidemia   Dementia   Metabolic encephalopathy   Acute on chronic kidney disease, stage 3   HCAP (healthcare-associated pneumonia)   Severe sepsis with septic shock   Hyperglycemia, drug-induced   COPD with exacerbation   Acute respiratory failure   Acute on chronic diastolic heart failure   Time spent 25 minutes

## 2013-12-12 DIAGNOSIS — E860 Dehydration: Secondary | ICD-10-CM

## 2013-12-12 DIAGNOSIS — E876 Hypokalemia: Secondary | ICD-10-CM

## 2013-12-12 LAB — GLUCOSE, CAPILLARY
GLUCOSE-CAPILLARY: 125 mg/dL — AB (ref 70–99)
GLUCOSE-CAPILLARY: 151 mg/dL — AB (ref 70–99)
Glucose-Capillary: 143 mg/dL — ABNORMAL HIGH (ref 70–99)
Glucose-Capillary: 92 mg/dL (ref 70–99)
Glucose-Capillary: 148 mg/dL — ABNORMAL HIGH (ref 70–99)
Glucose-Capillary: 172 mg/dL — ABNORMAL HIGH (ref 70–99)

## 2013-12-12 LAB — BASIC METABOLIC PANEL
BUN: 43 mg/dL — ABNORMAL HIGH (ref 6–23)
CO2: 23 mEq/L (ref 19–32)
Calcium: 8.9 mg/dL (ref 8.4–10.5)
Chloride: 117 mEq/L — ABNORMAL HIGH (ref 96–112)
Creatinine, Ser: 1.26 mg/dL — ABNORMAL HIGH (ref 0.50–1.10)
GFR calc non Af Amer: 38 mL/min — ABNORMAL LOW (ref >90)
GFR, EST AFRICAN AMERICAN: 44 mL/min — AB (ref >90)
GLUCOSE: 131 mg/dL — AB (ref 70–99)
POTASSIUM: 3.4 meq/L — AB (ref 3.7–5.3)
SODIUM: 155 meq/L — AB (ref 137–147)

## 2013-12-12 MED ORDER — IPRATROPIUM-ALBUTEROL 0.5-2.5 (3) MG/3ML IN SOLN
3.0000 mL | Freq: Three times a day (TID) | RESPIRATORY_TRACT | Status: DC
  Filled 2013-12-12: qty 3

## 2013-12-12 MED ORDER — POTASSIUM CHLORIDE 20 MEQ/15ML (10%) PO LIQD: 40.0000 meq | Freq: Once | ORAL | Status: DC

## 2013-12-12 MED ORDER — DEXTROSE 5 % IV SOLN
INTRAVENOUS | Status: DC
  Administered 2013-12-12: 08:00:00 via INTRAVENOUS

## 2014-01-01 NOTE — Progress Notes (Signed)
Speech Language Pathology Treatment: Dysphagia   Patient Details Name: Bonnie [name] MRN: [id number] DOB: [birth year] Today's Date: 12/23/2013 Time: 7564-3329 SLP Time Calculation (min): 15 min  Assessment / Plan / Recommendation Clinical Impression  Bonnie [name] continues to make progress with improved alertness. She remains confused (baseline dementia), but was able to so self present thin water via straw sips with min cues. Swallow initiation appears more prompt and pt does not complete multiple swallows with each sip (previously swallowed 4-5x with each presentation). Pt had one episode of delayed cough after consuming graham cracker followed by liquid wash. Will upgrade liquids to thin and continue puree diet with recommendation for SLP  Follow up at receiving facility for texture advancement as indicated.    HPI HPI: Bonnie [name] is an 80 yo female admitted from the [place] last week with acute respiratory failure requiring endotracheal intubation and mechanical ventilation secondary to influenza A and MRSA pneumonia. Pt was extubated yesterday.      SLP Plan  Goals updated    Recommendations Diet recommendations: Dysphagia 1 (puree);Thin liquid Liquids provided via: Cup;Straw Medication Administration: Crushed with puree Supervision: Full supervision/cueing for compensatory strategies;Staff to assist with self feeding Compensations: Slow rate;Small sips/bites Postural Changes and/or Swallow Maneuvers: Seated upright 90 degrees;Upright 30-60 min after meal              Oral Care Recommendations: Oral care BID;Staff/trained caregiver to provide oral care Follow up Recommendations: Skilled Nursing facility Plan: Goals updated    GO    Thank you,  Genene Churn, [place] 12/25/2013, 1:43 PM

## 2014-01-01 NOTE — Evaluation (Signed)
Physical Therapy Evaluation Patient Details Name: LUPITA ROSALES MRN: [id number] DOB: Sep 08, 1927 Today's Date: 12/19/13 Time: 4174-0814 PT Time Calculation (min): 41 min  PT Assessment / Plan / Recommendation History of Present Illness  Pt is a resident of [facility] who has baseline dementia.  She is admitted with pneumonia and the flu and is anticipating being discharged today or tomorrow.  Pt is normally ambulatory with no assitive device but needs assist with all ADLs including feeding.  Clinical Impression   Pt was seen for evaluation.  She is currently on 3 L O2 and becomes dyspneic with any exertion.  She is found to be significantly deconditioned and is now unable to walk.  She was able to follow simple directions and was very cooperative.  She would benefit from SNF at d/c.    PT Assessment  All further PT needs can be met in the next venue of care    Follow Up Recommendations  SNF    Does the patient have the potential to tolerate intense rehabilitation      Barriers to Discharge        Equipment Recommendations  Rolling walker with 5" wheels    Recommendations for Other Services     Frequency      Precautions / Restrictions Precautions Precautions: Fall Restrictions Weight Bearing Restrictions: No   Pertinent Vitals/Pain       Mobility  Bed Mobility Overal bed mobility: Needs Assistance Bed Mobility: Supine to Sit Supine to sit: Max assist;HOB elevated Transfers Overall transfer level: Needs assistance Equipment used: Rolling walker (2 wheeled) Transfers: Sit to/from [facility] Sit to Stand: Max assist Stand pivot transfers: Max assist Ambulation/Gait Ambulation/Gait assistance:  (unable to ambulate)    Exercises     PT Diagnosis: Difficulty walking;Generalized weakness  PT Problem List: Decreased strength;Decreased activity tolerance;Decreased mobility;Decreased cognition;Decreased safety awareness;Cardiopulmonary status limiting  activity PT Treatment Interventions:       PT Goals(Current goals can be found in the care plan section) Acute Rehab PT Goals PT Goal Formulation: No goals set, d/c therapy  Visit Information  Last PT Received On: 12-19-13 History of Present Illness: Pt is a resident of [facility] who has baseline dementia.  She is admitted with pneumonia and the flu and is anticipating being discharged today or tomorrow.  Pt is normally ambulatory with no assitive device but needs assist with all ADLs including feeding.       Prior [place] expects to be discharged to:: Skilled nursing facility Prior Function Level of Independence: Needs assistance Gait / Transfers Assistance Needed: ambulates independently  ADL's / Homemaking Assistance Needed: assist with all ADLs    Cognition  Cognition Arousal/Alertness: Awake/alert Behavior During Therapy: WFL for tasks assessed/performed Overall Cognitive Status: History of cognitive impairments - at baseline    Extremity/Trunk Assessment Lower Extremity Assessment Lower Extremity Assessment: Generalized weakness   Balance    End of Session PT - End of Session Equipment Utilized During Treatment: Gait belt Activity Tolerance: Patient limited by fatigue Patient left: in chair;with call bell/phone within reach;with chair alarm set;with nursing/sitter in room Nurse Communication: Mobility status  GP     Sable Feil 2013/12/19, 12:29 PM

## 2014-01-01 NOTE — Progress Notes (Signed)
[facility] PROGRESS NOTE  ADASYN MCADAMS HGD:[id number] DOB: [birth year] DOA: 11/26/2013 PCP: Glo Herring., MD  Assessment/Plan: 1. Acute respiratory failure requiring endotracheal intubation and mechanical ventilation secondary to influenza A and MRSA pneumonia. Extubated 1/7. No reintubation per family. Stable on nasal cannula oxygen. Completed antibiotics. 2. Status post severe sepsis with septic shock secondary to pneumonia, influenza. Stable. 3. Recurrent fever. Now 24 hours afebrile. Suspect atelectasis. No further evaluation at this point. 4. Dehydration, acute renal failure, hypernatremia. Likely related to limited oral intake and diuretics. Change IVF. Kidney function has improved as has BUN although sodium is higher. 5. Influenza A, completed course of Tamiflu. 6. Suspected acute on chronic diastolic heart failure. Now compensated. 7. Hyperglycemia. No history of diabetes. Hemoglobin A1c 6.4. Secondary to acute illness. Continue SSI. 8. Hypokalemia. 9. C. difficile negative diarrhea. Improved. 10. Right lower extremity skin tear. Local wound care. 11. History of adrenal insufficiency. Continue Cortef. 12. Dementia. Mental status improved. On memory care unit at [place]. 13. Anemia of critical illness. Stable. 14. Left UPJ stone without obstruction, stable on repeat u/s with no hydronephrosis.   Continue supplemental oxygen  Basic metabolic panel in the morning  Replete potassium  Possible discharge to skilled nursing facility 1/30  Code Status: DNR. DNI. DVT prophylaxis: heparin Family Communication: as above Disposition Plan: pending  Murray Hodgkins, MD  [facility]  Pager [phone] If 7PM-7AM, please contact night-coverage at [email or web address], password [facility] January 02, 2014, 5:58 PM  LOS: 13 days   Summary: 80 year old woman admitted with acute respiratory distress, quickly decompensated and required intubation and mechanical ventilation. Tracheal  aspirate grew MRSA and she was treated for MRSA pneumonia, septic shock, influenza A, acute on chronic diastolic heart failure.  Consultants:  Pulmonology  Speech therapy. Dysphagia 1 diet. Thing liquids.  Physical therapy: SNF  Procedures:  ETT 12/31 >> 1/7.  Antibiotics:  1. Vancomycin 11/30/2013- 1/12 2. Tamiflu 11/30/13-12/04/2013  3. Zosyn 12/01/2013-12/04/2013  4. Cefepime 11/22/2013-11/19/2013.  HPI/Subjective: History Limited the patient more alert today.  Objective: Filed Vitals:   01-02-2014 0500 02-Jan-2014 0714 01/02/14 1125 01/02/2014 1501  BP: 101/65   98/55  Pulse: 91   105  Temp: 98.6 F (37 C)   98.7 F (37.1 C)  TempSrc: Oral   Oral  Resp: 20   20  Height:      Weight:      SpO2: 95% 94% 95% 96%    Intake/Output Summary (Last 24 hours) at 2014-01-02 1758 Last data filed at 2014/01/02 1700  Gross per 24 hour  Intake 1441.67 ml  Output      1 ml  Net 1440.67 ml     Filed Weights   12/06/13 0500 12/07/13 0500 12/08/13 0400  Weight: 62.3 kg (137 lb 5.6 oz) 61.9 kg (136 lb 7.4 oz) 62.5 kg (137 lb 12.6 oz)    Exam:   Afebrile 24 hours. Stable hypoxia on 3 L. Hemodynamic stable.  General: Very awake and alert today. Appears much better. Nontoxic.  Cardiovascular: Regular rate and rhythm. No murmur, rub or gallop. No lower extremity edema.  Respiratory generally clear without significant wheezing, rales or rhonchi. Grossly normal respiratory effort.  Abdomen soft, nontender, nondistended  Psychiatric: She is a much more alert.  Right lower extremity skin tear healing slowly. No evidence of infection  Data Reviewed:  Capillary blood sugars stable  Sodium 155. BUN and creatinine improving. Potassium 3.4.  Scheduled Meds: . antiseptic oral rinse  15 mL Mouth Rinse QID  . chlorhexidine  15 mL Mouth Rinse BID  . DULoxetine  30 mg Oral Daily  . heparin  5,000 Units Subcutaneous Q8H  . hydrocortisone  10 mg Oral Daily  . insulin aspart  0-15 Units  Subcutaneous Q6H  . ipratropium-albuterol  3 mL Nebulization TID  . pantoprazole (PROTONIX) IV  40 mg Intravenous Daily  . sodium chloride  500 mL Intravenous Once  . sodium chloride  10-40 mL Intracatheter Q12H   Continuous Infusions: . dextrose 75 mL/hr at 12/09/2013 4174    Principal Problem:   Influenza with pneumonia Active Problems:   DCIS (ductal carcinoma in situ) of breast   Hypoadrenalism   Hypertension   Hyperlipidemia   Dementia   Metabolic encephalopathy   Acute on chronic kidney disease, stage 3   HCAP (healthcare-associated pneumonia)   Severe sepsis with septic shock   Hyperglycemia, drug-induced   COPD with exacerbation   Acute respiratory failure   Acute on chronic diastolic heart failure   Time spent 20 minutes

## 2014-01-01 NOTE — Discharge Summary (Signed)
Death Summary  Bonnie [name]:[id number] DOB: 07-04-1927 DOA: 12-29-2013  PCP: Glo Herring., MD  Admit date: December 29, 2013 Date of Death: 2014/01/11  Final Diagnoses:  1. Acute respiratory failure 2. MRSA pneumonia 3. Influenza A. 4. Severe sepsis with septic shock 5. Dementia  History of present illness:  80 year old woman admitted with acute respiratory distress, quickly decompensated and required intubation and mechanical ventilation.  Hospital Course:  She was admitted to the ICU, further evaluation revealed MRSA pneumonia, influenza A, septic shock and acute on chronic diastolic heart failure. She was seen in consultation with pulmonology and remained on the ventilator for 8 days. In consultation with family she was made DO NOT RESUSCITATE and attempts were made to extubate which was undertaken successfully. Her condition gradually improved but she remained confused, hypoxic, generally weak with poor oral intake. She suddenly died 01/11/2023 secondary to inability to recover from severe illness.  Discussed with son and family at bedside.   Time: 45 minutes  Signed:  Murray Hodgkins, MD  [facility] 01-11-14, 9:23 PM

## 2014-01-01 NOTE — Clinical Social Work Note (Signed)
CSW presented bed offers and pt's sons choose [facility] They have already completed paperwork at facility. Possible d/c tomorrow per MD.   Benay Pike, [place]

## 2014-01-01 NOTE — Progress Notes (Signed)
Family left the bedside. Took patients belongs (clothes and dentures) with them.

## 2014-01-01 NOTE — Clinical Social Work Placement (Signed)
Clinical Social Work Department CLINICAL SOCIAL WORK PLACEMENT NOTE 12/01/2013  Patient:  Bonnie [name], Bonnie [name]  Account Number:  [id number] Admit date:  12/18/2013  Clinical Social Worker:  Benay Pike, LCSW  Date/time:  12/09/2013 12:57 PM  Clinical Social Work is seeking post-discharge placement for this patient at the following level of care:   [place]   (*CSW will update this form in Epic as items are completed)   12/09/2013  Patient/family provided with [place] Department of Clinical Social Work's list of facilities offering this level of care within the geographic area requested by the patient (or if unable, by the patient's family).  12/09/2013  Patient/family informed of their freedom to choose among providers that offer the needed level of care, that participate in Medicare, Medicaid or managed care program needed by the patient, have an available bed and are willing to accept the patient.  12/09/2013  Patient/family informed of [facility]' ownership interest in [facility], as well as of the fact that they are under no obligation to receive care at this facility.  PASARR submitted to EDS on 12/09/2013 PASARR number received from [employer] on 12/09/2013  FL2 transmitted to all facilities in geographic area requested by pt/family on  12/09/2013 FL2 transmitted to all facilities within larger geographic area on   Patient informed that his/her managed care company has contracts with or will negotiate with  certain facilities, including the following:     Patient/family informed of bed offers received:  12/30/2013 Patient chooses bed at [facility] Physician recommends and patient chooses bed at  [facility]  Patient to be transferred to  on   Patient to be transferred to facility by   The following physician request were entered in Epic:   Additional Comments:  Benay Pike, [place]

## 2014-01-01 NOTE — Progress Notes (Signed)
Son Dominica Severin and other family members are at the bedside.

## 2014-01-01 NOTE — Progress Notes (Signed)
[facility] contacted. Spoke with Alvira Philips. Patient is not a candidate. Reference # [id number].

## 2014-01-01 NOTE — Progress Notes (Signed)
Upon assessment, patient not responsive. Assessment revealed no movement of air in the lungs and no apical or peripheral pulses. Assessment confirmed also by A. Cletus Gash, Therapist, sports. Time of death is 20.  Dr. Darrick Meigs was notified. Her son, Margarita Grizzle was called and informed as well.

## 2014-01-01 DEATH — deceased

## 2014-01-31 ENCOUNTER — Ambulatory Visit (INDEPENDENT_AMBULATORY_CARE_PROVIDER_SITE_OTHER): Payer: Medicare Other | Admitting: Internal Medicine

## 2014-03-07 ENCOUNTER — Ambulatory Visit: Payer: Medicare Other | Admitting: Radiation Oncology

## 2014-10-02 ENCOUNTER — Encounter (HOSPITAL_COMMUNITY): Payer: Self-pay | Admitting: Emergency Medicine

## 2015-01-10 IMAGING — CR DG SHOULDER 2+V*R*
3 series · 3 of 3 positions shown · non-contrast
Comparison: [DATE]

CLINICAL DATA: Right shoulder pain post fall 2 nights ago

RIGHT SHOULDER - 2+ VIEW

[view not recorded (1 of 3)]
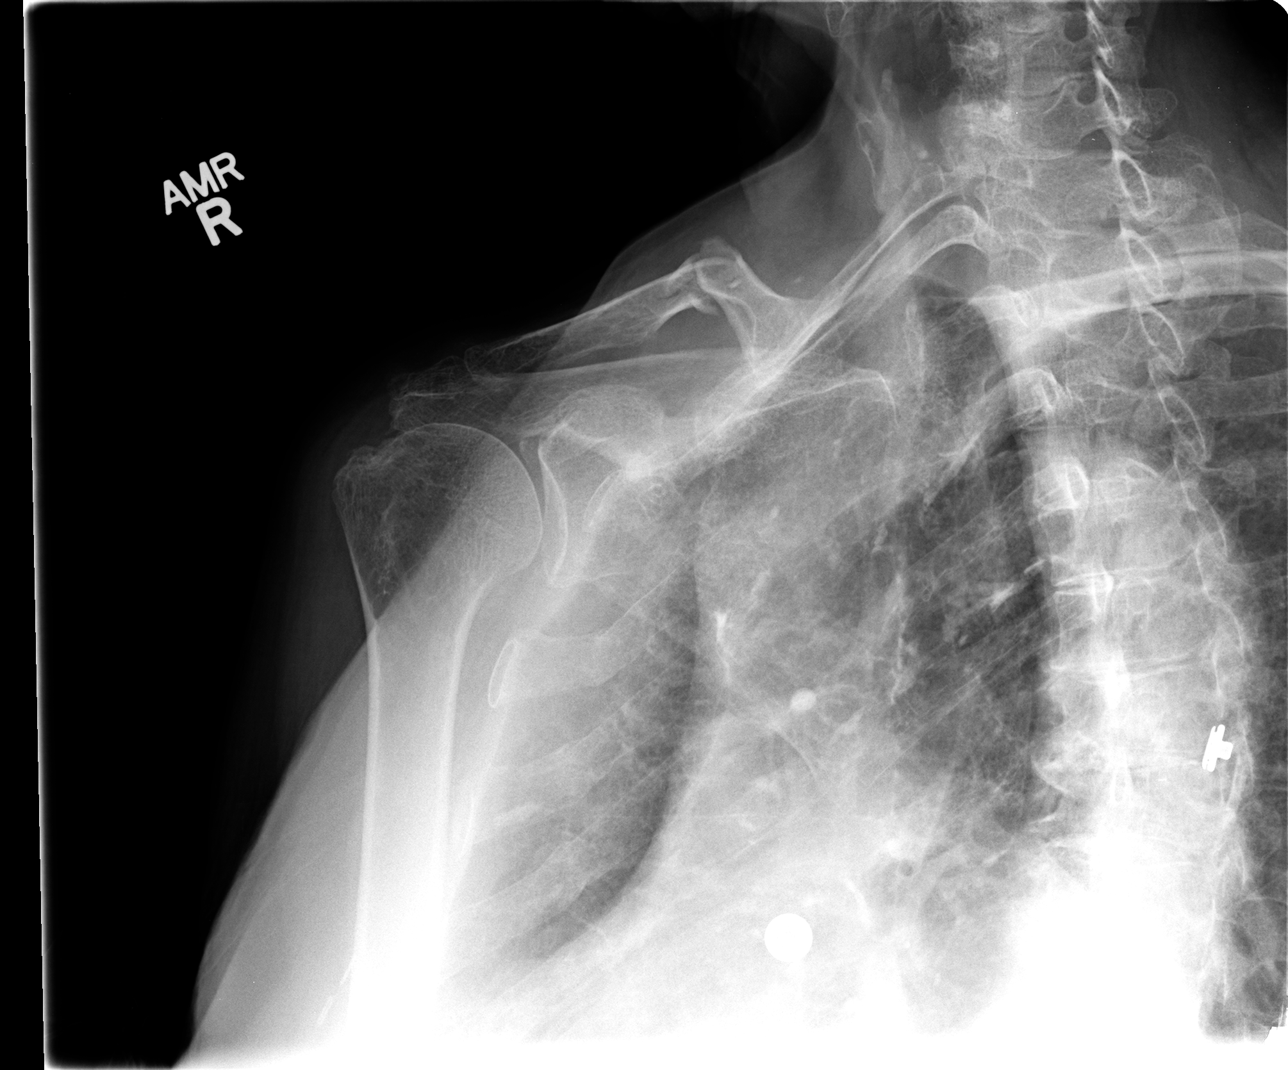

[view not recorded (2 of 3)]
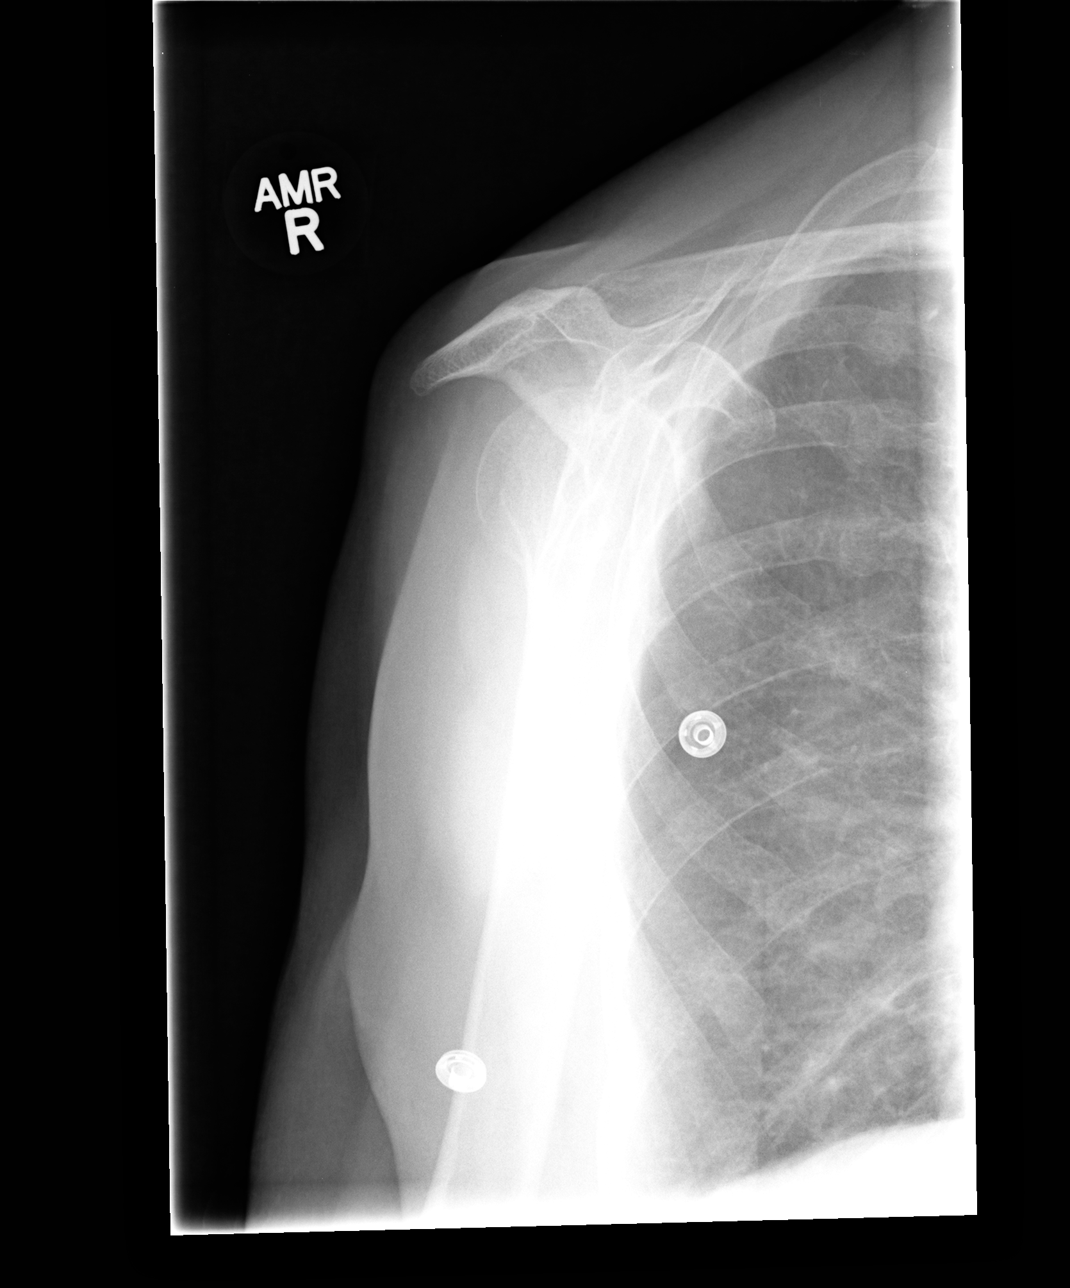

[view not recorded (3 of 3)]
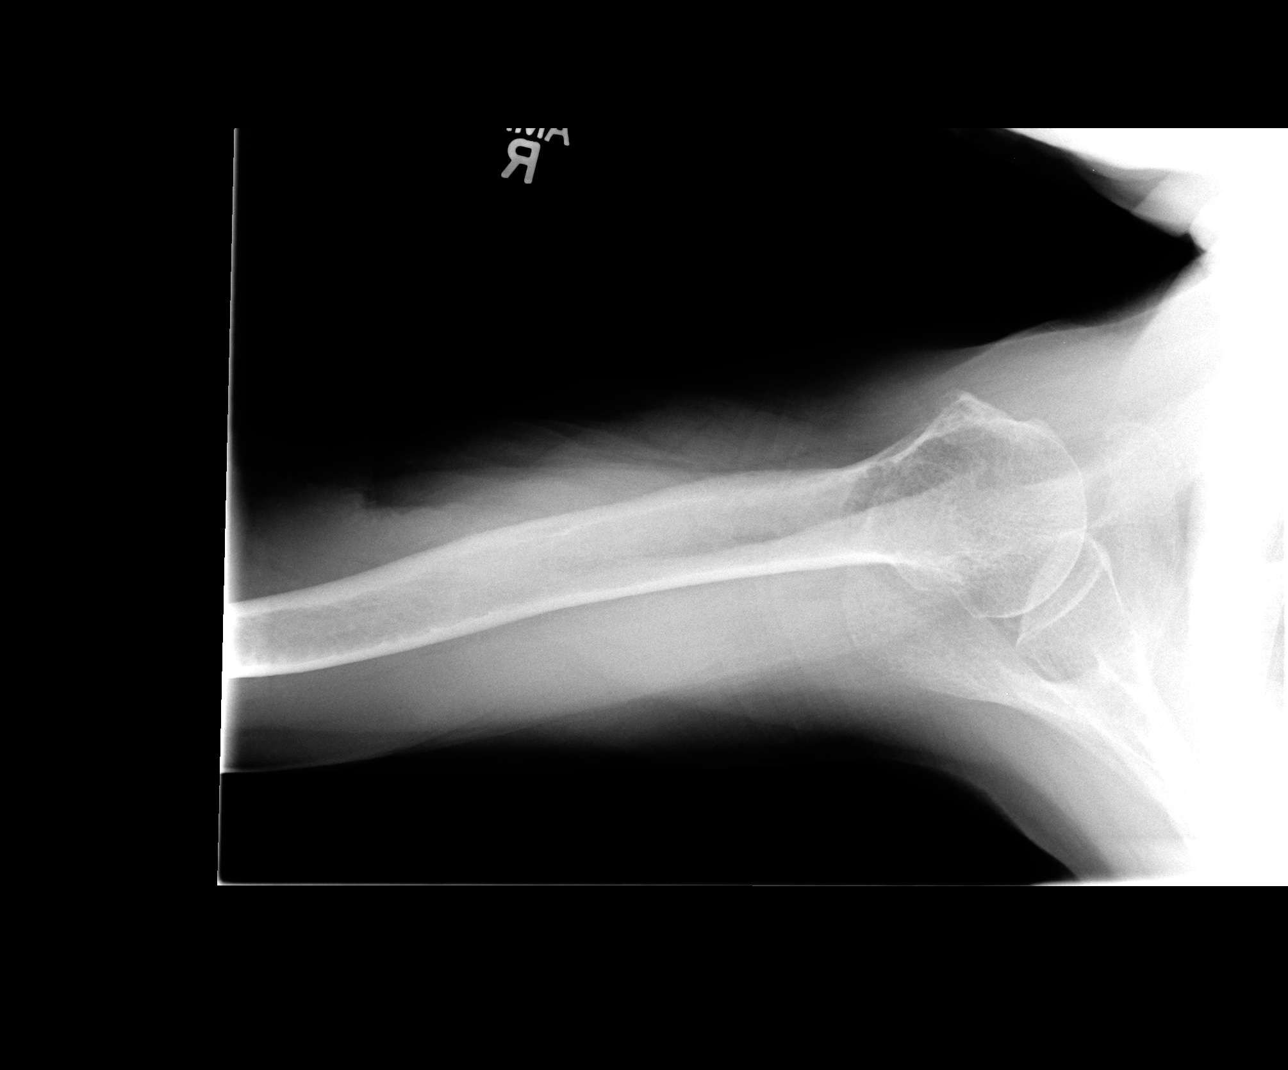

[3 of 3 positions shown; findings below may reference images not displayed]

FINDINGS: Three views of the right shoulder submitted.  There is
diffuse osteopenia.  There is angulated fracture of the mid
clavicle shaft.  Glenohumeral joint is preserved.  Mild
degenerative changes AC joint.  Mild spurring of the humeral head.
IMPRESSION: There is angulated fracture of the mid clavicle shaft.
Glenohumeral joint is preserved.  Mild degenerative changes AC
joint.  Mild spurring of the humeral head.

## 2015-01-10 IMAGING — CR DG CHEST 2V
2 series · 2 of 2 positions shown · non-contrast
Comparison: None.

CLINICAL DATA: Fall, bruising upper chest/right shoulder

CHEST - 2 VIEW

[view not recorded (1 of 2)]
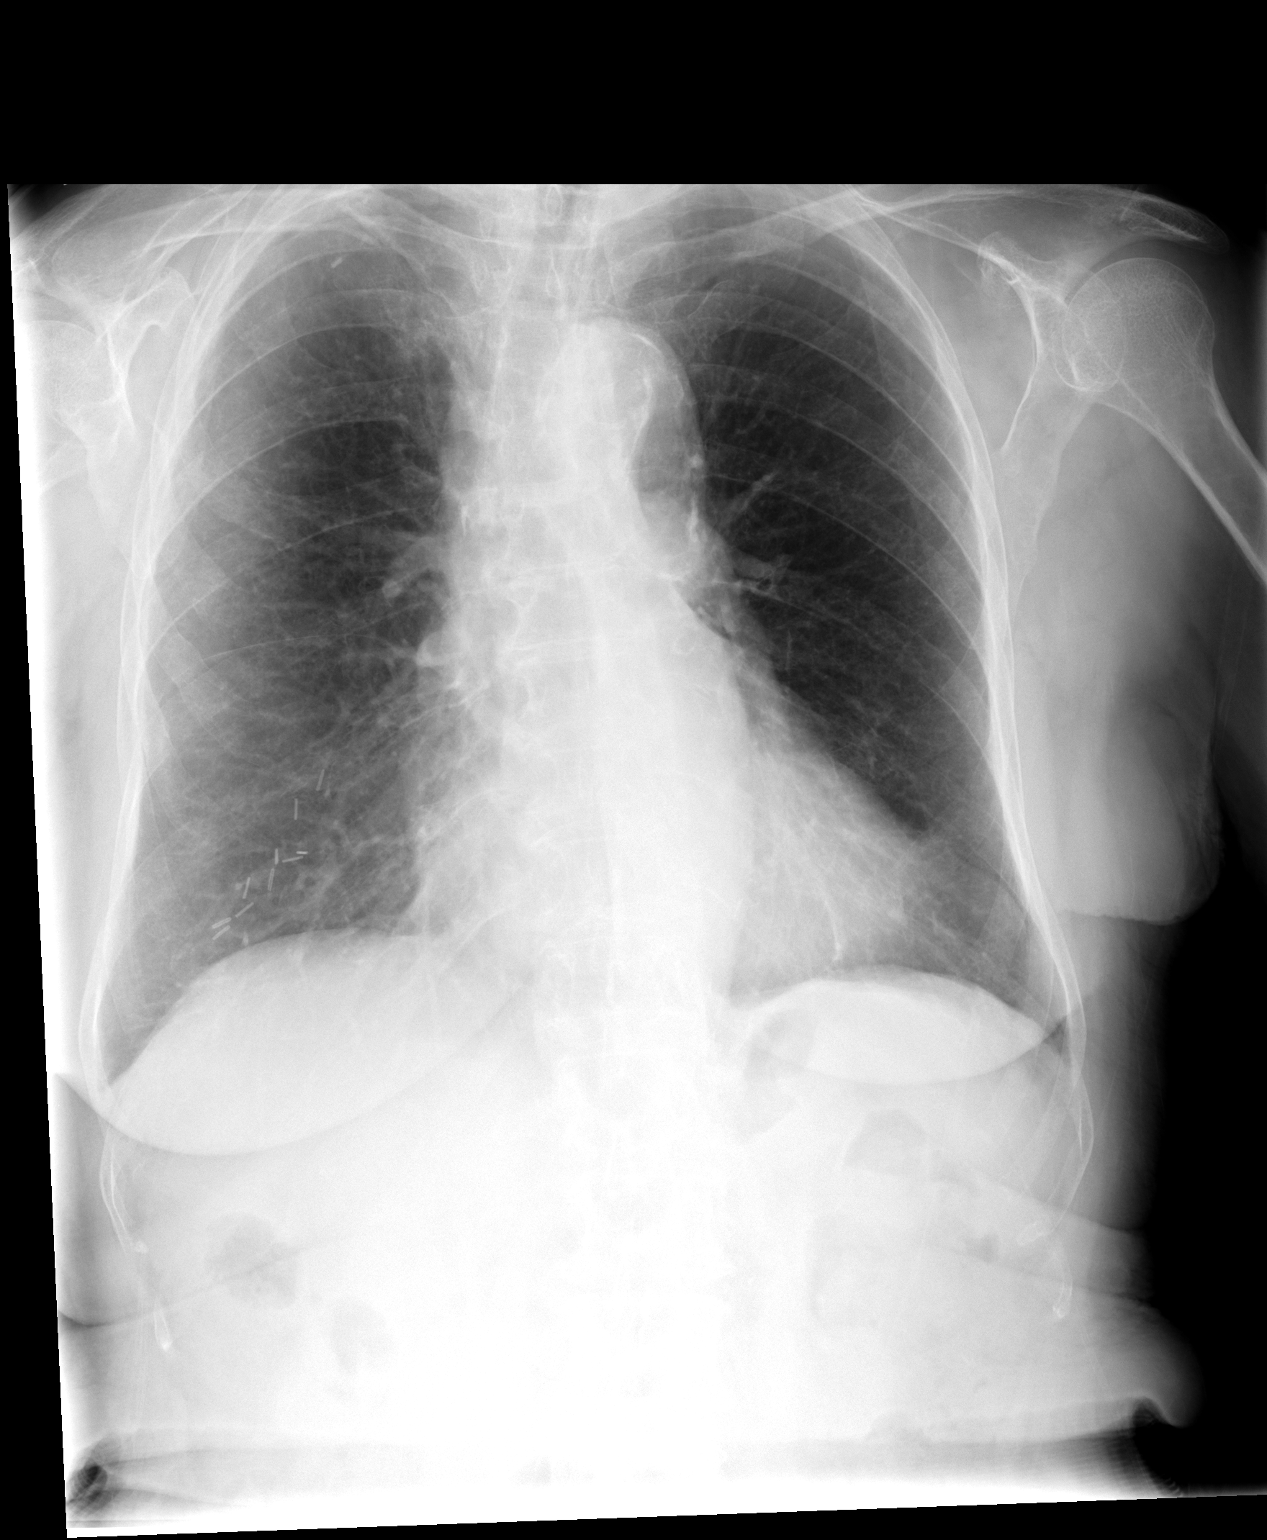

[view not recorded (2 of 2)]
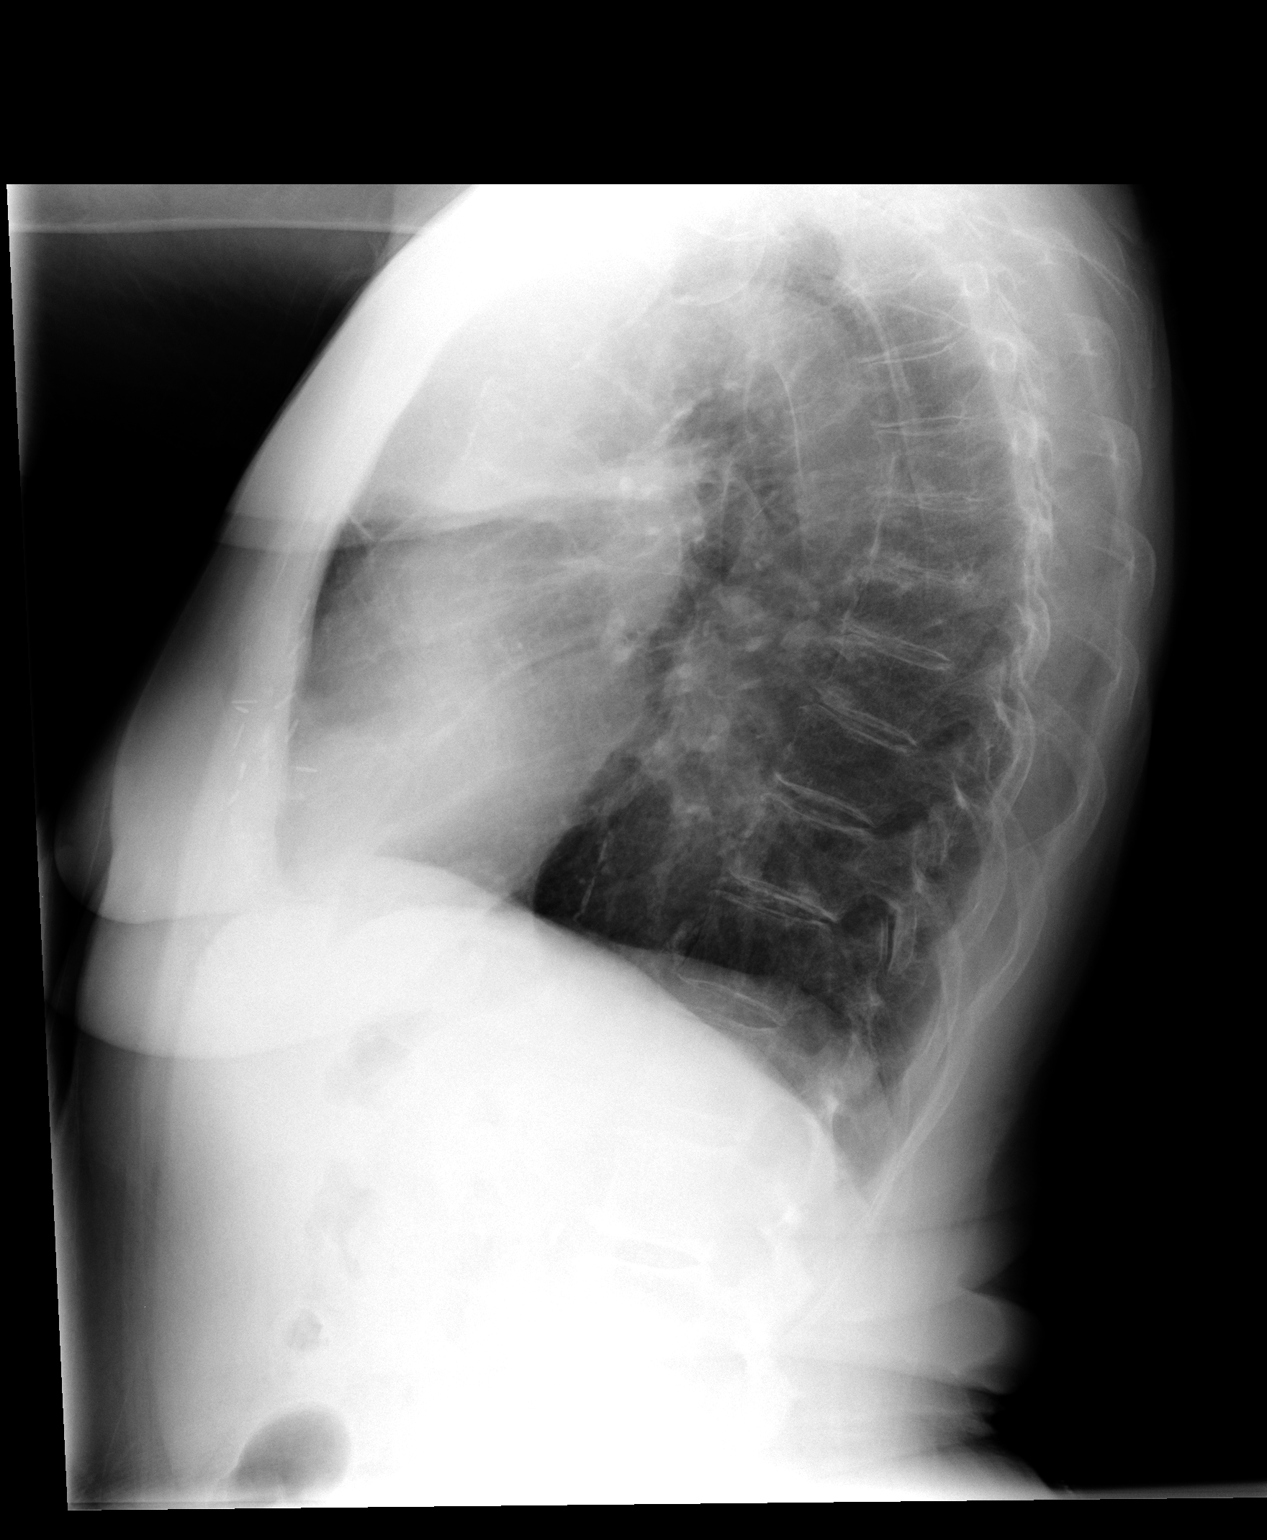

[2 of 2 positions shown; findings below may reference images not displayed]

FINDINGS: Lungs are clear. No pleural effusion or pneumothorax.

The heart is normal in size.

Surgical clips overlying the right lower chest wall/breast.

Mildly displaced/angulated mid right clavicle fracture.
IMPRESSION: Mildly displaced/angulated mid right clavicle fracture.

No pneumothorax.

## 2015-04-29 IMAGING — CR DG CHEST 2V
2 series · 2 of 2 positions shown · non-contrast
Comparison: 09/08/2013

CLINICAL DATA: Cough.

EXAM:
CHEST  2 VIEW

[view not recorded (1 of 2)]
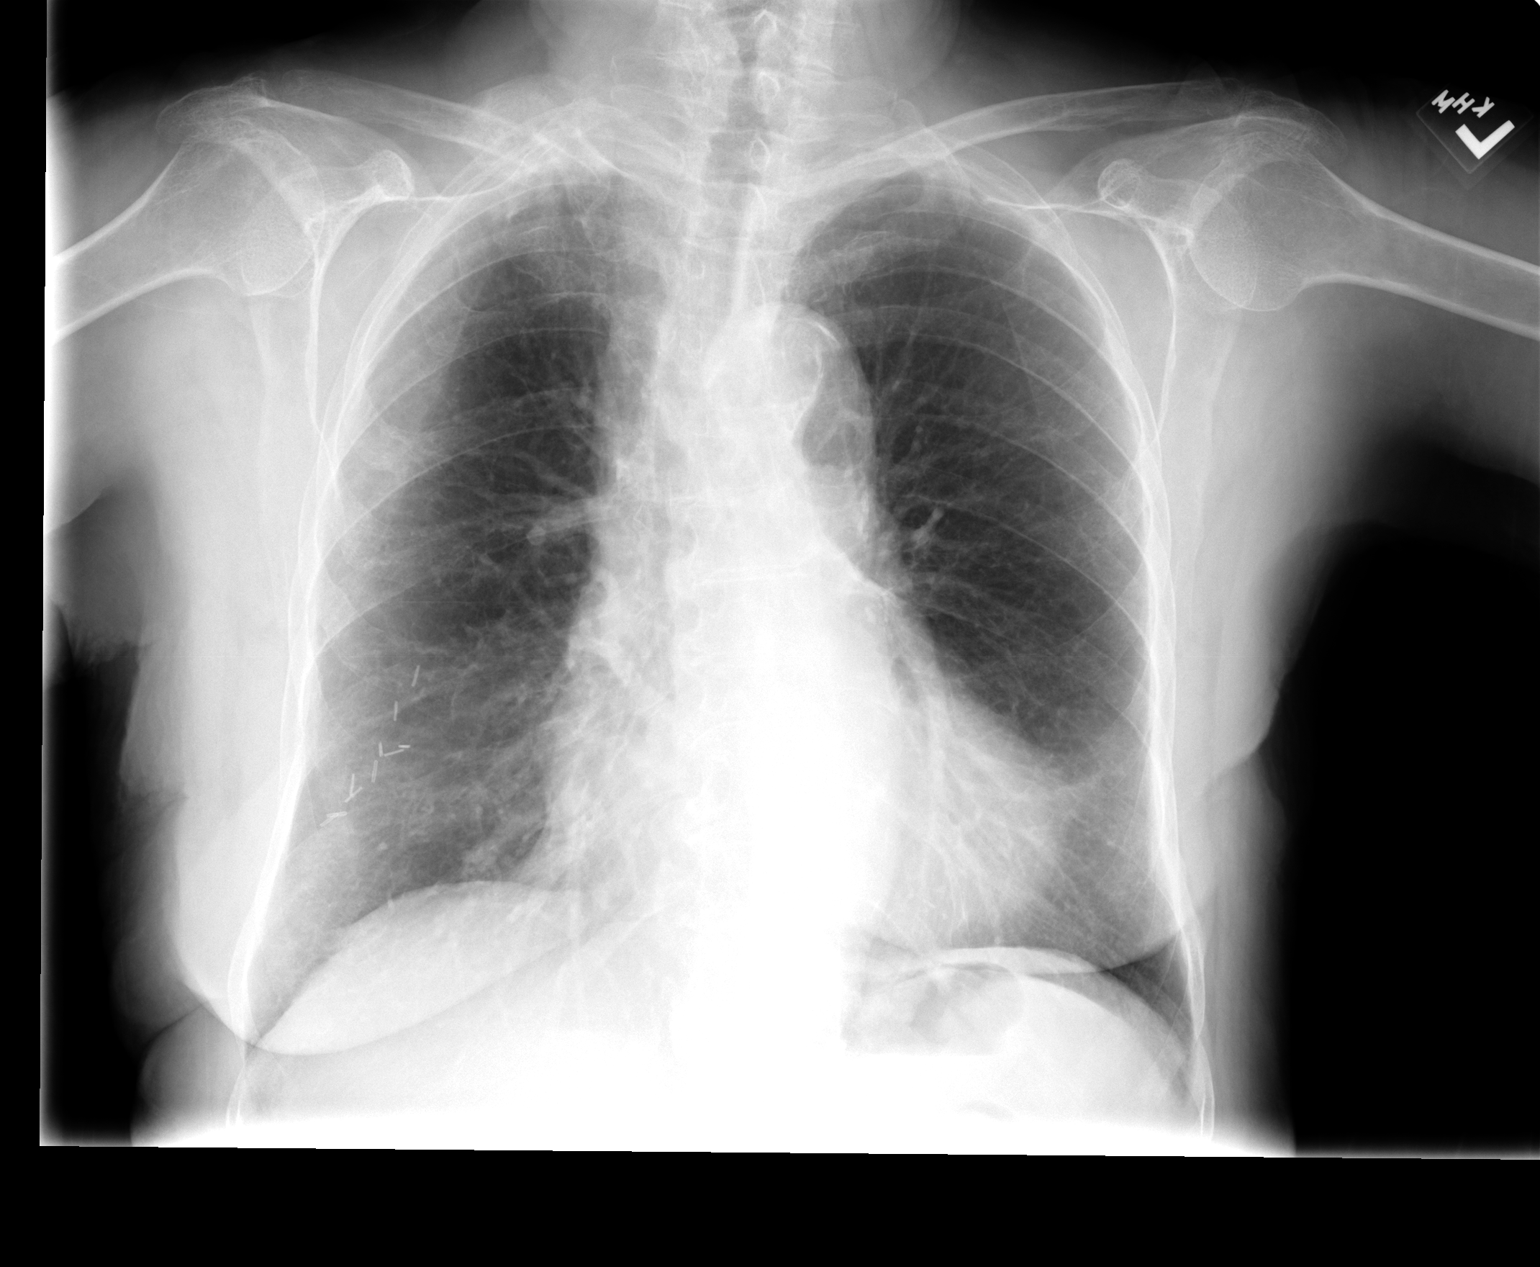

[view not recorded (2 of 2)]
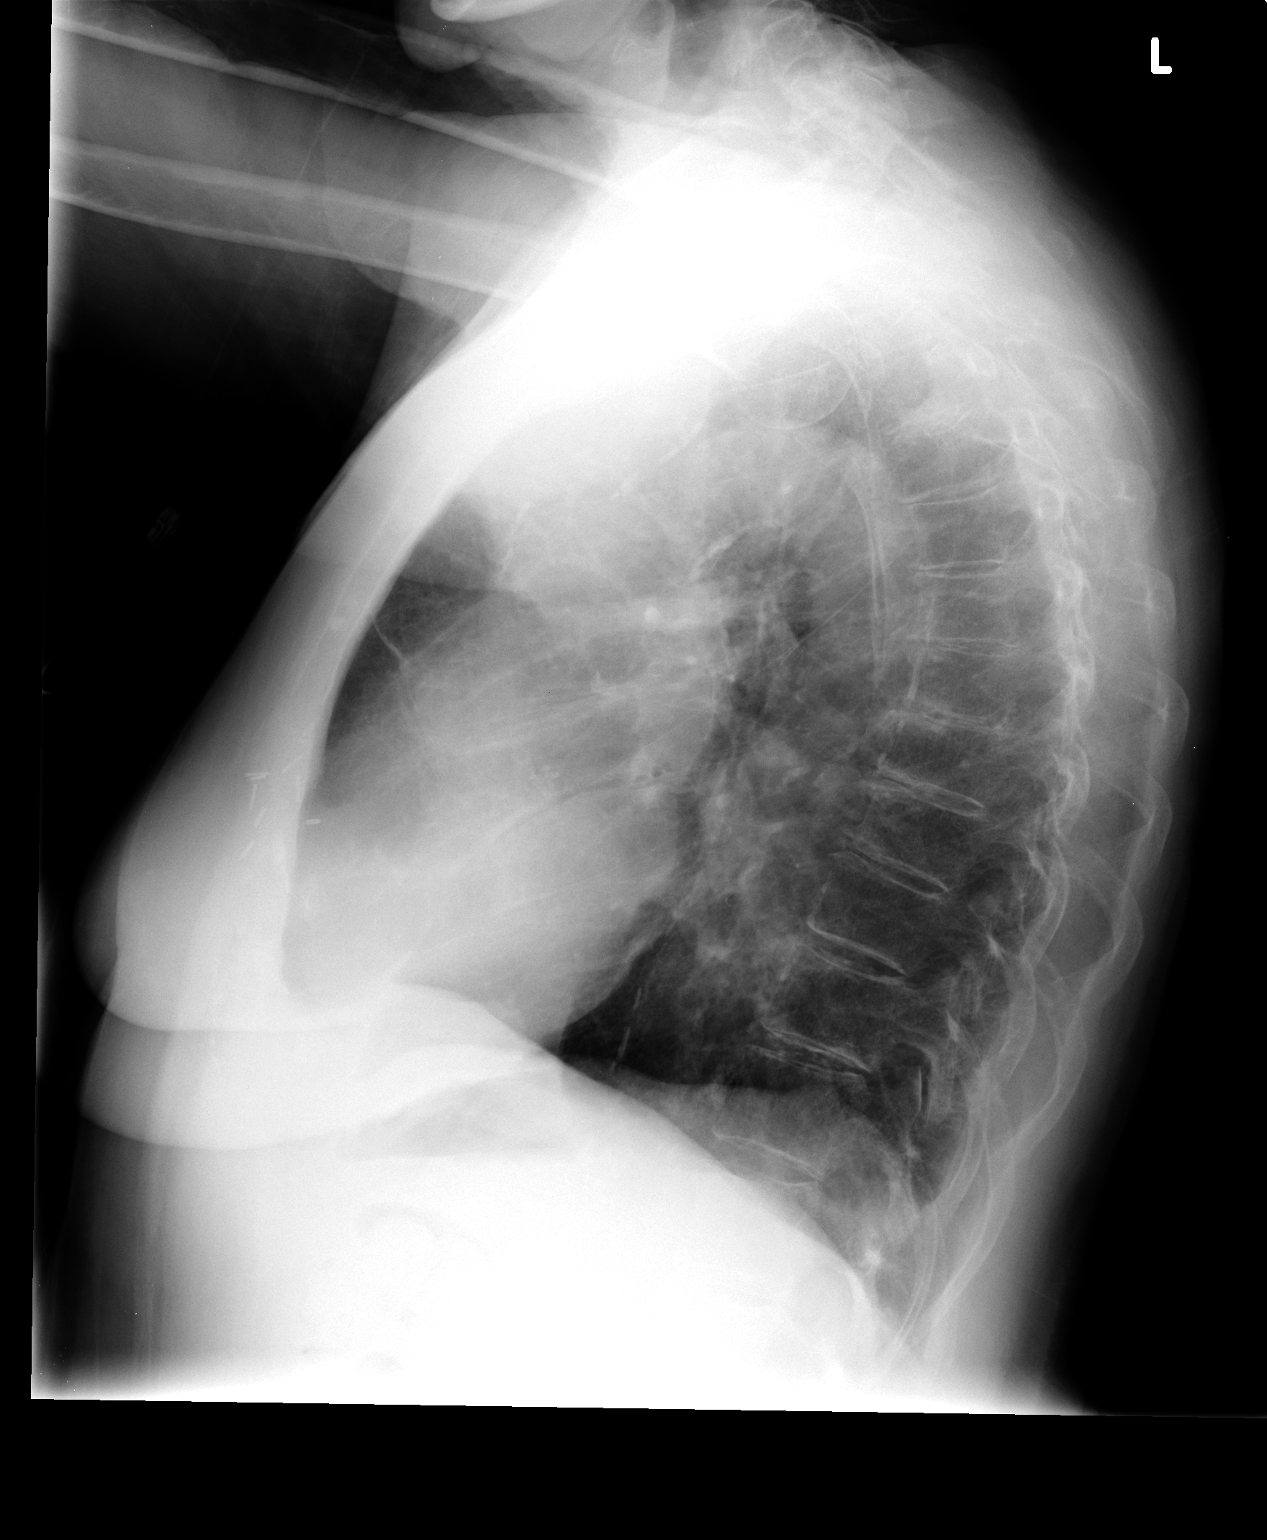

[2 of 2 positions shown; findings below may reference images not displayed]

FINDINGS: Right breast surgical clips are present. The cardiac silhouette is
within normal limits. Thoracic aorta is tortuous and calcified. The
lungs remain hyperinflated. There is no evidence of focal airspace
consolidation, edema, pleural effusion, or pneumothorax. Old right
clavicle fracture is noted with prominent callus formation.
IMPRESSION: No evidence of acute airspace disease.
